# Patient Record
Sex: Male | Born: 1970 | Race: White | Hispanic: No | Marital: Married | Smoking: Current every day smoker
Health system: Southern US, Community
[De-identification: ages and names within clinical notes are randomized; demographics above are authoritative.]

## PROBLEM LIST (undated history)

## (undated) DIAGNOSIS — I10 Essential (primary) hypertension: Secondary | ICD-10-CM

## (undated) DIAGNOSIS — M1710 Unilateral primary osteoarthritis, unspecified knee: Secondary | ICD-10-CM

## (undated) DIAGNOSIS — M25569 Pain in unspecified knee: Secondary | ICD-10-CM

## (undated) DIAGNOSIS — E119 Type 2 diabetes mellitus without complications: Secondary | ICD-10-CM

## (undated) DIAGNOSIS — G8929 Other chronic pain: Secondary | ICD-10-CM

## (undated) DIAGNOSIS — F1110 Opioid abuse, uncomplicated: Secondary | ICD-10-CM

## (undated) HISTORY — PX: KNEE SURGERY: SHX244

## (undated) HISTORY — PX: KNEE ARTHROSCOPY AND ARTHROTOMY: SUR84

---

## 2000-01-06 ENCOUNTER — Encounter: Admission: RE | Admit: 2000-01-06 | Discharge: 2000-04-05 | Payer: Self-pay | Admitting: Anesthesiology

## 2000-04-21 ENCOUNTER — Encounter: Admission: RE | Admit: 2000-04-21 | Discharge: 2000-07-20 | Payer: Self-pay | Admitting: Anesthesiology

## 2000-08-12 ENCOUNTER — Encounter: Admission: RE | Admit: 2000-08-12 | Discharge: 2000-11-10 | Payer: Self-pay | Admitting: Anesthesiology

## 2000-12-01 ENCOUNTER — Encounter: Admission: RE | Admit: 2000-12-01 | Discharge: 2001-03-01 | Payer: Self-pay | Admitting: Anesthesiology

## 2001-03-23 ENCOUNTER — Encounter: Admission: RE | Admit: 2001-03-23 | Discharge: 2001-06-21 | Payer: Self-pay | Admitting: Anesthesiology

## 2002-03-19 ENCOUNTER — Emergency Department (HOSPITAL_COMMUNITY): Admission: EM | Admit: 2002-03-19 | Discharge: 2002-03-19 | Payer: Self-pay | Admitting: Emergency Medicine

## 2002-11-14 ENCOUNTER — Emergency Department (HOSPITAL_COMMUNITY): Admission: EM | Admit: 2002-11-14 | Discharge: 2002-11-14 | Payer: Self-pay | Admitting: Emergency Medicine

## 2002-11-17 ENCOUNTER — Emergency Department (HOSPITAL_COMMUNITY): Admission: EM | Admit: 2002-11-17 | Discharge: 2002-11-17 | Payer: Self-pay | Admitting: Emergency Medicine

## 2002-11-18 ENCOUNTER — Emergency Department (HOSPITAL_COMMUNITY): Admission: EM | Admit: 2002-11-18 | Discharge: 2002-11-18 | Payer: Self-pay | Admitting: Emergency Medicine

## 2002-11-19 ENCOUNTER — Emergency Department (HOSPITAL_COMMUNITY): Admission: EM | Admit: 2002-11-19 | Discharge: 2002-11-19 | Payer: Self-pay | Admitting: Emergency Medicine

## 2002-11-21 ENCOUNTER — Emergency Department (HOSPITAL_COMMUNITY): Admission: EM | Admit: 2002-11-21 | Discharge: 2002-11-21 | Payer: Self-pay | Admitting: Emergency Medicine

## 2002-11-25 ENCOUNTER — Emergency Department (HOSPITAL_COMMUNITY): Admission: EM | Admit: 2002-11-25 | Discharge: 2002-11-25 | Payer: Self-pay | Admitting: *Deleted

## 2002-11-26 ENCOUNTER — Emergency Department (HOSPITAL_COMMUNITY): Admission: EM | Admit: 2002-11-26 | Discharge: 2002-11-26 | Payer: Self-pay | Admitting: *Deleted

## 2002-12-18 ENCOUNTER — Emergency Department (HOSPITAL_COMMUNITY): Admission: EM | Admit: 2002-12-18 | Discharge: 2002-12-18 | Payer: Self-pay | Admitting: Emergency Medicine

## 2002-12-18 ENCOUNTER — Encounter: Payer: Self-pay | Admitting: Emergency Medicine

## 2002-12-19 ENCOUNTER — Emergency Department (HOSPITAL_COMMUNITY): Admission: EM | Admit: 2002-12-19 | Discharge: 2002-12-19 | Payer: Self-pay | Admitting: Emergency Medicine

## 2003-01-16 ENCOUNTER — Emergency Department (HOSPITAL_COMMUNITY): Admission: EM | Admit: 2003-01-16 | Discharge: 2003-01-16 | Payer: Self-pay | Admitting: *Deleted

## 2003-01-21 ENCOUNTER — Emergency Department (HOSPITAL_COMMUNITY): Admission: EM | Admit: 2003-01-21 | Discharge: 2003-01-21 | Payer: Self-pay | Admitting: Emergency Medicine

## 2003-06-08 ENCOUNTER — Encounter: Payer: Self-pay | Admitting: *Deleted

## 2003-06-08 ENCOUNTER — Emergency Department (HOSPITAL_COMMUNITY): Admission: EM | Admit: 2003-06-08 | Discharge: 2003-06-08 | Payer: Self-pay | Admitting: *Deleted

## 2003-10-31 ENCOUNTER — Emergency Department (HOSPITAL_COMMUNITY): Admission: EM | Admit: 2003-10-31 | Discharge: 2003-10-31 | Payer: Self-pay | Admitting: Emergency Medicine

## 2003-12-24 ENCOUNTER — Emergency Department (HOSPITAL_COMMUNITY): Admission: EM | Admit: 2003-12-24 | Discharge: 2003-12-24 | Payer: Self-pay | Admitting: Emergency Medicine

## 2003-12-24 DIAGNOSIS — M25562 Pain in left knee: Secondary | ICD-10-CM | POA: Insufficient documentation

## 2004-01-11 ENCOUNTER — Emergency Department (HOSPITAL_COMMUNITY): Admission: EM | Admit: 2004-01-11 | Discharge: 2004-01-11 | Payer: Self-pay | Admitting: Emergency Medicine

## 2004-01-31 ENCOUNTER — Ambulatory Visit (HOSPITAL_COMMUNITY): Admission: RE | Admit: 2004-01-31 | Discharge: 2004-01-31 | Payer: Self-pay | Admitting: Orthopedic Surgery

## 2004-02-02 ENCOUNTER — Emergency Department (HOSPITAL_COMMUNITY): Admission: EM | Admit: 2004-02-02 | Discharge: 2004-02-03 | Payer: Self-pay | Admitting: *Deleted

## 2004-02-19 ENCOUNTER — Ambulatory Visit (HOSPITAL_COMMUNITY): Admission: RE | Admit: 2004-02-19 | Discharge: 2004-02-19 | Payer: Self-pay | Admitting: Orthopedic Surgery

## 2004-02-24 ENCOUNTER — Encounter (HOSPITAL_COMMUNITY): Admission: RE | Admit: 2004-02-24 | Discharge: 2004-03-25 | Payer: Self-pay | Admitting: Orthopedic Surgery

## 2004-03-27 ENCOUNTER — Encounter (HOSPITAL_COMMUNITY): Admission: RE | Admit: 2004-03-27 | Discharge: 2004-04-26 | Payer: Self-pay | Admitting: Orthopedic Surgery

## 2004-10-01 ENCOUNTER — Emergency Department (HOSPITAL_COMMUNITY): Admission: EM | Admit: 2004-10-01 | Discharge: 2004-10-01 | Payer: Self-pay | Admitting: Emergency Medicine

## 2004-11-17 ENCOUNTER — Emergency Department (HOSPITAL_COMMUNITY): Admission: EM | Admit: 2004-11-17 | Discharge: 2004-11-17 | Payer: Self-pay | Admitting: Emergency Medicine

## 2005-03-14 ENCOUNTER — Emergency Department (HOSPITAL_COMMUNITY): Admission: EM | Admit: 2005-03-14 | Discharge: 2005-03-14 | Payer: Self-pay | Admitting: Emergency Medicine

## 2005-03-14 ENCOUNTER — Emergency Department (HOSPITAL_COMMUNITY): Admission: EM | Admit: 2005-03-14 | Discharge: 2005-03-15 | Payer: Self-pay | Admitting: *Deleted

## 2005-03-21 ENCOUNTER — Emergency Department (HOSPITAL_COMMUNITY): Admission: EM | Admit: 2005-03-21 | Discharge: 2005-03-21 | Payer: Self-pay | Admitting: Emergency Medicine

## 2005-04-13 ENCOUNTER — Emergency Department (HOSPITAL_COMMUNITY): Admission: EM | Admit: 2005-04-13 | Discharge: 2005-04-13 | Payer: Self-pay | Admitting: Emergency Medicine

## 2005-05-01 ENCOUNTER — Emergency Department (HOSPITAL_COMMUNITY): Admission: EM | Admit: 2005-05-01 | Discharge: 2005-05-01 | Payer: Self-pay | Admitting: Emergency Medicine

## 2005-05-02 ENCOUNTER — Emergency Department (HOSPITAL_COMMUNITY): Admission: EM | Admit: 2005-05-02 | Discharge: 2005-05-02 | Payer: Self-pay | Admitting: Emergency Medicine

## 2005-10-13 ENCOUNTER — Emergency Department (HOSPITAL_COMMUNITY): Admission: EM | Admit: 2005-10-13 | Discharge: 2005-10-14 | Payer: Self-pay | Admitting: Emergency Medicine

## 2005-10-26 ENCOUNTER — Emergency Department (HOSPITAL_COMMUNITY): Admission: EM | Admit: 2005-10-26 | Discharge: 2005-10-26 | Payer: Self-pay | Admitting: Emergency Medicine

## 2005-12-21 ENCOUNTER — Emergency Department (HOSPITAL_COMMUNITY): Admission: EM | Admit: 2005-12-21 | Discharge: 2005-12-21 | Payer: Self-pay | Admitting: Emergency Medicine

## 2006-02-24 ENCOUNTER — Emergency Department (HOSPITAL_COMMUNITY): Admission: EM | Admit: 2006-02-24 | Discharge: 2006-02-24 | Payer: Self-pay | Admitting: Emergency Medicine

## 2006-05-15 ENCOUNTER — Emergency Department (HOSPITAL_COMMUNITY): Admission: EM | Admit: 2006-05-15 | Discharge: 2006-05-15 | Payer: Self-pay | Admitting: Emergency Medicine

## 2006-05-25 ENCOUNTER — Emergency Department (HOSPITAL_COMMUNITY): Admission: EM | Admit: 2006-05-25 | Discharge: 2006-05-25 | Payer: Self-pay | Admitting: Emergency Medicine

## 2006-06-13 ENCOUNTER — Emergency Department (HOSPITAL_COMMUNITY): Admission: EM | Admit: 2006-06-13 | Discharge: 2006-06-13 | Payer: Self-pay | Admitting: Emergency Medicine

## 2006-07-06 ENCOUNTER — Emergency Department (HOSPITAL_COMMUNITY): Admission: EM | Admit: 2006-07-06 | Discharge: 2006-07-06 | Payer: Self-pay | Admitting: Emergency Medicine

## 2006-07-09 ENCOUNTER — Emergency Department (HOSPITAL_COMMUNITY): Admission: EM | Admit: 2006-07-09 | Discharge: 2006-07-09 | Payer: Self-pay | Admitting: Emergency Medicine

## 2006-07-12 ENCOUNTER — Emergency Department (HOSPITAL_COMMUNITY): Admission: EM | Admit: 2006-07-12 | Discharge: 2006-07-12 | Payer: Self-pay | Admitting: Emergency Medicine

## 2006-07-24 ENCOUNTER — Emergency Department (HOSPITAL_COMMUNITY): Admission: EM | Admit: 2006-07-24 | Discharge: 2006-07-24 | Payer: Self-pay | Admitting: Emergency Medicine

## 2006-08-22 ENCOUNTER — Emergency Department (HOSPITAL_COMMUNITY): Admission: EM | Admit: 2006-08-22 | Discharge: 2006-08-23 | Payer: Self-pay | Admitting: Emergency Medicine

## 2006-08-29 ENCOUNTER — Emergency Department (HOSPITAL_COMMUNITY): Admission: EM | Admit: 2006-08-29 | Discharge: 2006-08-29 | Payer: Self-pay | Admitting: Emergency Medicine

## 2006-09-17 ENCOUNTER — Emergency Department (HOSPITAL_COMMUNITY): Admission: EM | Admit: 2006-09-17 | Discharge: 2006-09-17 | Payer: Self-pay | Admitting: Emergency Medicine

## 2006-09-30 ENCOUNTER — Emergency Department (HOSPITAL_COMMUNITY): Admission: EM | Admit: 2006-09-30 | Discharge: 2006-09-30 | Payer: Self-pay | Admitting: Emergency Medicine

## 2006-10-15 ENCOUNTER — Emergency Department (HOSPITAL_COMMUNITY): Admission: EM | Admit: 2006-10-15 | Discharge: 2006-10-15 | Payer: Self-pay | Admitting: Emergency Medicine

## 2006-10-22 ENCOUNTER — Emergency Department (HOSPITAL_COMMUNITY): Admission: EM | Admit: 2006-10-22 | Discharge: 2006-10-22 | Payer: Self-pay | Admitting: Emergency Medicine

## 2006-11-05 ENCOUNTER — Emergency Department (HOSPITAL_COMMUNITY): Admission: EM | Admit: 2006-11-05 | Discharge: 2006-11-05 | Payer: Self-pay | Admitting: Emergency Medicine

## 2006-12-04 ENCOUNTER — Emergency Department (HOSPITAL_COMMUNITY): Admission: EM | Admit: 2006-12-04 | Discharge: 2006-12-04 | Payer: Self-pay | Admitting: Emergency Medicine

## 2006-12-17 ENCOUNTER — Emergency Department (HOSPITAL_COMMUNITY): Admission: EM | Admit: 2006-12-17 | Discharge: 2006-12-17 | Payer: Self-pay | Admitting: Emergency Medicine

## 2007-01-01 ENCOUNTER — Emergency Department (HOSPITAL_COMMUNITY): Admission: EM | Admit: 2007-01-01 | Discharge: 2007-01-01 | Payer: Self-pay | Admitting: Emergency Medicine

## 2007-01-02 ENCOUNTER — Emergency Department (HOSPITAL_COMMUNITY): Admission: EM | Admit: 2007-01-02 | Discharge: 2007-01-03 | Payer: Self-pay | Admitting: Emergency Medicine

## 2007-01-15 ENCOUNTER — Emergency Department (HOSPITAL_COMMUNITY): Admission: EM | Admit: 2007-01-15 | Discharge: 2007-01-15 | Payer: Self-pay | Admitting: Emergency Medicine

## 2007-02-06 ENCOUNTER — Emergency Department (HOSPITAL_COMMUNITY): Admission: EM | Admit: 2007-02-06 | Discharge: 2007-02-06 | Payer: Self-pay | Admitting: Emergency Medicine

## 2007-02-22 ENCOUNTER — Emergency Department (HOSPITAL_COMMUNITY): Admission: EM | Admit: 2007-02-22 | Discharge: 2007-02-22 | Payer: Self-pay | Admitting: Emergency Medicine

## 2007-03-13 ENCOUNTER — Emergency Department (HOSPITAL_COMMUNITY): Admission: EM | Admit: 2007-03-13 | Discharge: 2007-03-13 | Payer: Self-pay | Admitting: Emergency Medicine

## 2007-03-19 ENCOUNTER — Emergency Department (HOSPITAL_COMMUNITY): Admission: EM | Admit: 2007-03-19 | Discharge: 2007-03-19 | Payer: Self-pay | Admitting: Emergency Medicine

## 2007-04-03 ENCOUNTER — Emergency Department (HOSPITAL_COMMUNITY): Admission: EM | Admit: 2007-04-03 | Discharge: 2007-04-03 | Payer: Self-pay | Admitting: Emergency Medicine

## 2007-04-09 ENCOUNTER — Emergency Department (HOSPITAL_COMMUNITY): Admission: EM | Admit: 2007-04-09 | Discharge: 2007-04-09 | Payer: Self-pay | Admitting: Emergency Medicine

## 2007-04-20 ENCOUNTER — Emergency Department (HOSPITAL_COMMUNITY): Admission: EM | Admit: 2007-04-20 | Discharge: 2007-04-20 | Payer: Self-pay | Admitting: Emergency Medicine

## 2007-04-26 ENCOUNTER — Emergency Department (HOSPITAL_COMMUNITY): Admission: EM | Admit: 2007-04-26 | Discharge: 2007-04-26 | Payer: Self-pay | Admitting: Emergency Medicine

## 2007-05-01 ENCOUNTER — Emergency Department (HOSPITAL_COMMUNITY): Admission: EM | Admit: 2007-05-01 | Discharge: 2007-05-01 | Payer: Self-pay | Admitting: Diagnostic Radiology

## 2007-05-08 ENCOUNTER — Emergency Department (HOSPITAL_COMMUNITY): Admission: EM | Admit: 2007-05-08 | Discharge: 2007-05-08 | Payer: Self-pay | Admitting: Emergency Medicine

## 2007-06-17 ENCOUNTER — Emergency Department (HOSPITAL_COMMUNITY): Admission: EM | Admit: 2007-06-17 | Discharge: 2007-06-17 | Payer: Self-pay | Admitting: Emergency Medicine

## 2007-06-24 ENCOUNTER — Emergency Department (HOSPITAL_COMMUNITY): Admission: EM | Admit: 2007-06-24 | Discharge: 2007-06-24 | Payer: Self-pay | Admitting: Emergency Medicine

## 2007-07-01 ENCOUNTER — Emergency Department (HOSPITAL_COMMUNITY): Admission: EM | Admit: 2007-07-01 | Discharge: 2007-07-01 | Payer: Self-pay | Admitting: Emergency Medicine

## 2007-07-05 ENCOUNTER — Emergency Department (HOSPITAL_COMMUNITY): Admission: EM | Admit: 2007-07-05 | Discharge: 2007-07-05 | Payer: Self-pay | Admitting: Internal Medicine

## 2007-07-10 ENCOUNTER — Emergency Department (HOSPITAL_COMMUNITY): Admission: EM | Admit: 2007-07-10 | Discharge: 2007-07-10 | Payer: Self-pay | Admitting: Emergency Medicine

## 2007-07-17 ENCOUNTER — Emergency Department (HOSPITAL_COMMUNITY): Admission: EM | Admit: 2007-07-17 | Discharge: 2007-07-17 | Payer: Self-pay | Admitting: Emergency Medicine

## 2007-08-08 ENCOUNTER — Emergency Department (HOSPITAL_COMMUNITY): Admission: EM | Admit: 2007-08-08 | Discharge: 2007-08-08 | Payer: Self-pay | Admitting: Emergency Medicine

## 2007-09-03 ENCOUNTER — Emergency Department (HOSPITAL_COMMUNITY): Admission: EM | Admit: 2007-09-03 | Discharge: 2007-09-03 | Payer: Self-pay | Admitting: Emergency Medicine

## 2007-09-13 ENCOUNTER — Emergency Department (HOSPITAL_COMMUNITY): Admission: EM | Admit: 2007-09-13 | Discharge: 2007-09-13 | Payer: Self-pay | Admitting: *Deleted

## 2007-09-22 ENCOUNTER — Emergency Department (HOSPITAL_COMMUNITY): Admission: EM | Admit: 2007-09-22 | Discharge: 2007-09-22 | Payer: Self-pay | Admitting: Emergency Medicine

## 2007-10-13 ENCOUNTER — Emergency Department (HOSPITAL_COMMUNITY): Admission: EM | Admit: 2007-10-13 | Discharge: 2007-10-13 | Payer: Self-pay | Admitting: Emergency Medicine

## 2007-11-03 ENCOUNTER — Emergency Department (HOSPITAL_COMMUNITY): Admission: EM | Admit: 2007-11-03 | Discharge: 2007-11-03 | Payer: Self-pay | Admitting: Emergency Medicine

## 2007-11-20 ENCOUNTER — Emergency Department (HOSPITAL_COMMUNITY): Admission: EM | Admit: 2007-11-20 | Discharge: 2007-11-20 | Payer: Self-pay | Admitting: Emergency Medicine

## 2007-12-24 ENCOUNTER — Emergency Department (HOSPITAL_COMMUNITY): Admission: EM | Admit: 2007-12-24 | Discharge: 2007-12-24 | Payer: Self-pay | Admitting: Emergency Medicine

## 2008-04-18 ENCOUNTER — Emergency Department (HOSPITAL_COMMUNITY): Admission: EM | Admit: 2008-04-18 | Discharge: 2008-04-18 | Payer: Self-pay | Admitting: Emergency Medicine

## 2008-09-01 IMAGING — CR DG KNEE COMPLETE 4+V*L*
5 series · 5 of 5 positions shown · non-contrast
Comparison: none

CLINICAL DATA: Fall, left knee pain

LEFT KNEE - 4 VIEW

[view not recorded (1 of 5)]
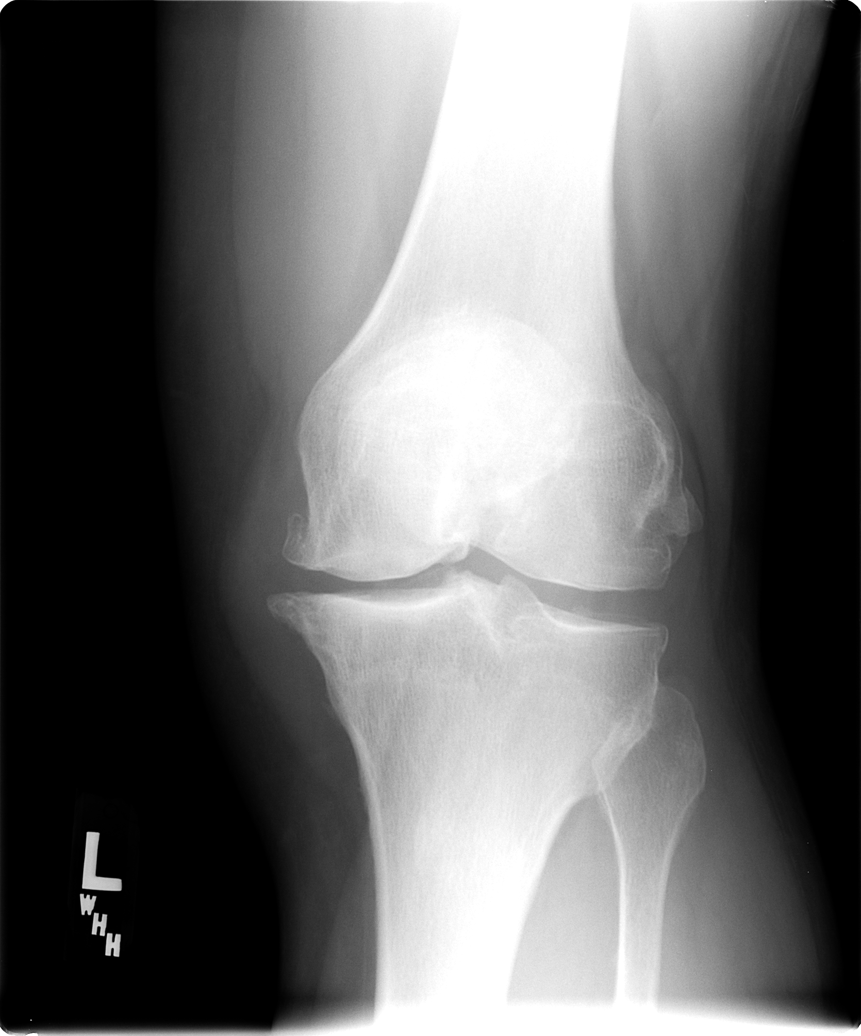

[view not recorded (2 of 5)]
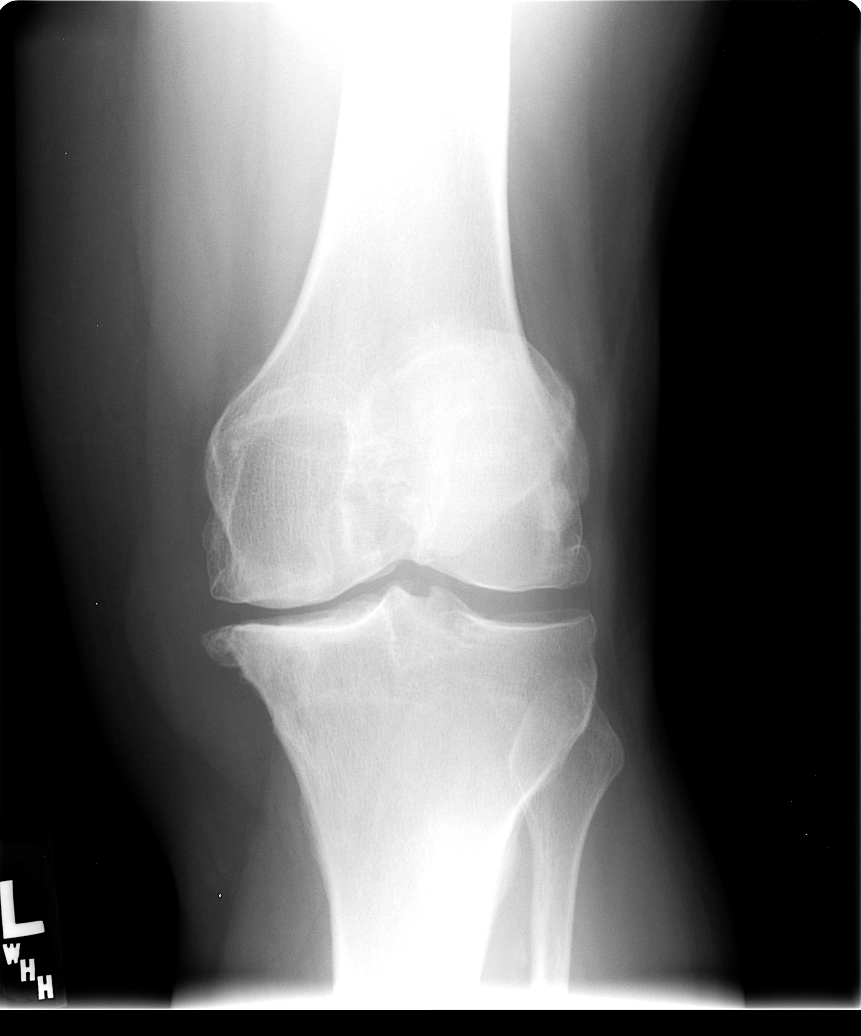

[view not recorded (3 of 5)]
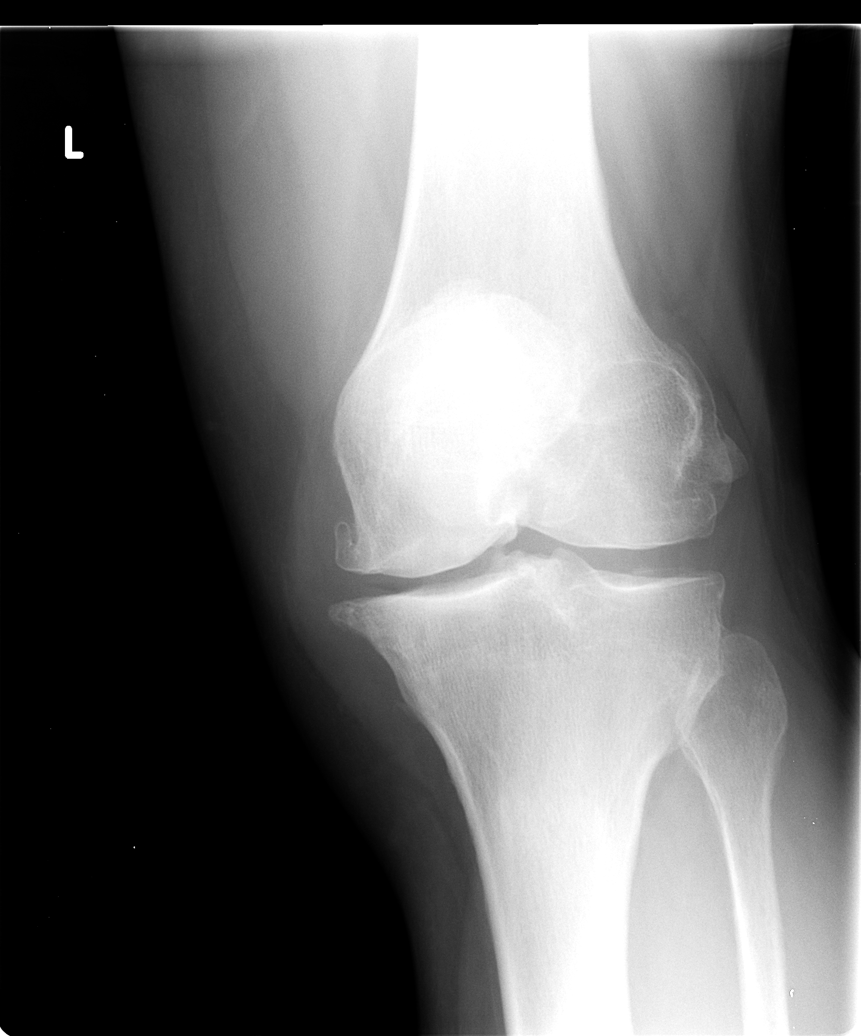

[view not recorded (4 of 5)]
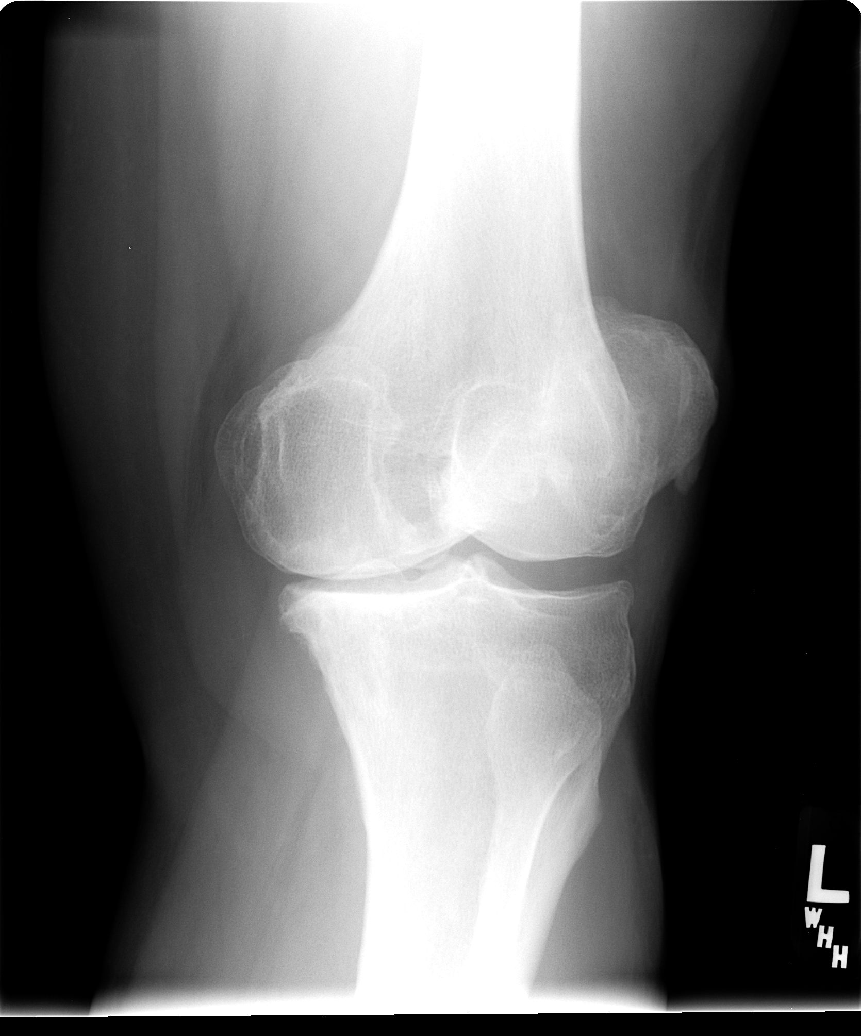

[view not recorded (5 of 5)]
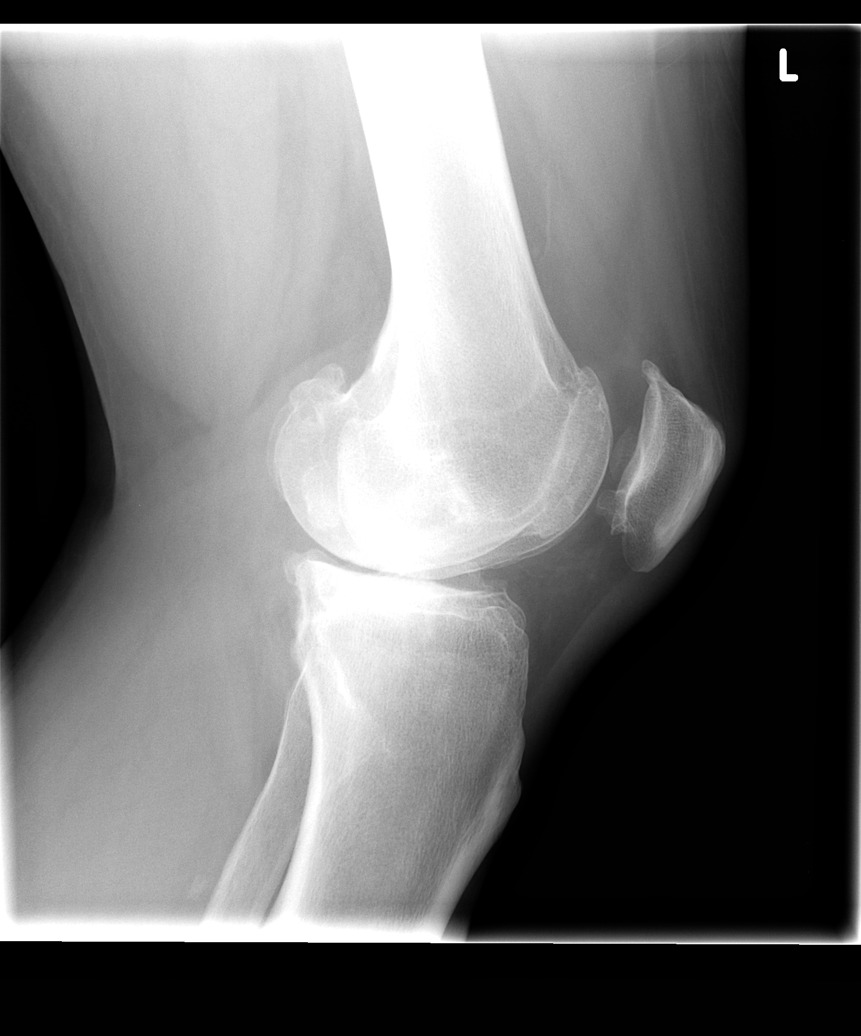

[5 of 5 positions shown; findings below may reference images not displayed]

FINDINGS: Severe degenerative changes throughout the left knee, most pronounced
in the medial compartment. No acute bony abnormality. No fracture, subluxation,
or dislocation.

IMPRESSION

Severe degenerative changes. No acute findings.

## 2009-02-28 ENCOUNTER — Emergency Department (HOSPITAL_COMMUNITY): Admission: EM | Admit: 2009-02-28 | Discharge: 2009-02-28 | Payer: Self-pay | Admitting: Emergency Medicine

## 2009-09-17 ENCOUNTER — Emergency Department (HOSPITAL_COMMUNITY): Admission: EM | Admit: 2009-09-17 | Discharge: 2009-09-17 | Payer: Self-pay | Admitting: Emergency Medicine

## 2010-01-02 ENCOUNTER — Emergency Department (HOSPITAL_COMMUNITY): Admission: EM | Admit: 2010-01-02 | Discharge: 2010-01-02 | Payer: Self-pay | Admitting: Emergency Medicine

## 2010-02-20 ENCOUNTER — Emergency Department (HOSPITAL_COMMUNITY): Admission: EM | Admit: 2010-02-20 | Discharge: 2010-02-20 | Payer: Self-pay | Admitting: Emergency Medicine

## 2010-06-06 ENCOUNTER — Emergency Department (HOSPITAL_COMMUNITY): Admission: EM | Admit: 2010-06-06 | Discharge: 2010-06-06 | Payer: Self-pay | Admitting: Emergency Medicine

## 2010-08-01 ENCOUNTER — Emergency Department (HOSPITAL_COMMUNITY): Admission: EM | Admit: 2010-08-01 | Discharge: 2010-08-01 | Payer: Self-pay | Admitting: Emergency Medicine

## 2010-09-26 ENCOUNTER — Emergency Department (HOSPITAL_COMMUNITY): Admission: EM | Admit: 2010-09-26 | Discharge: 2010-09-26 | Payer: Self-pay | Admitting: Emergency Medicine

## 2010-11-21 ENCOUNTER — Emergency Department (HOSPITAL_COMMUNITY)
Admission: EM | Admit: 2010-11-21 | Discharge: 2010-11-21 | Payer: Self-pay | Source: Home / Self Care | Admitting: Emergency Medicine

## 2010-11-28 ENCOUNTER — Encounter: Payer: Self-pay | Admitting: General Surgery

## 2011-01-15 ENCOUNTER — Emergency Department (HOSPITAL_COMMUNITY)
Admission: EM | Admit: 2011-01-15 | Discharge: 2011-01-15 | Disposition: A | Discharge status: Home / Self Care | Payer: Self-pay | Attending: Emergency Medicine | Admitting: Emergency Medicine

## 2011-01-15 DIAGNOSIS — K089 Disorder of teeth and supporting structures, unspecified: Secondary | ICD-10-CM | POA: Insufficient documentation

## 2011-01-15 DIAGNOSIS — I10 Essential (primary) hypertension: Secondary | ICD-10-CM | POA: Insufficient documentation

## 2011-01-15 DIAGNOSIS — F17200 Nicotine dependence, unspecified, uncomplicated: Secondary | ICD-10-CM | POA: Insufficient documentation

## 2011-03-14 ENCOUNTER — Emergency Department (HOSPITAL_COMMUNITY)
Admission: EM | Admit: 2011-03-14 | Discharge: 2011-03-14 | Disposition: A | Discharge status: Home / Self Care | Payer: Self-pay | Attending: Emergency Medicine | Admitting: Emergency Medicine

## 2011-03-14 DIAGNOSIS — W57XXXA Bitten or stung by nonvenomous insect and other nonvenomous arthropods, initial encounter: Secondary | ICD-10-CM | POA: Insufficient documentation

## 2011-03-14 DIAGNOSIS — M545 Low back pain, unspecified: Secondary | ICD-10-CM | POA: Insufficient documentation

## 2011-03-14 DIAGNOSIS — S30860A Insect bite (nonvenomous) of lower back and pelvis, initial encounter: Secondary | ICD-10-CM | POA: Insufficient documentation

## 2011-03-14 DIAGNOSIS — F17200 Nicotine dependence, unspecified, uncomplicated: Secondary | ICD-10-CM | POA: Insufficient documentation

## 2011-03-14 DIAGNOSIS — I10 Essential (primary) hypertension: Secondary | ICD-10-CM | POA: Insufficient documentation

## 2011-03-26 NOTE — H&P (Signed)
[facility]  Patient:    Steven Barnes, Steven Barnes                         MRN: [id number] Adm. 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 The patient continues to be fairly well-controlled on his current dose of OxyContin at 40 mg twice a day.  He tells me that his employer will only let him come here every eight weeks and he needs to make arrangements to have his prescriptions mailed to him in the interim.  I advised him that it would not be a problem.  He denies any new neurologic symptoms.  He denies any problems with constipation.  He notes that his pain continues to be made worse by reaching, stretching or sitting for long periods.  PHYSICAL EXAMINATION:  VITAL SIGNS:  Blood pressure 134/73.  Heart rate is 86.  Respiratory rate is 18.  O2 saturation is 96%.  Pain level is 6/10.  BACK:  Straight leg raise signs are negative.  EXTREMITIES:  Deep tendon reflexes were symmetric.  IMPRESSION: 1. Low back pain on the basis of lumbar degenerative disk disease. 2. Other medical problems per ______ .  DISPOSITION: 1. Continue on OxyContin 20 mg 2 p.o. q.12h., #120 with no refill. 2. Continue with current bowel regimen. 3. Follow up with me in eight weeks.  He is to let us know when he needs a    prescription mailed to him in the interim. DD:  03/24/01 TD:  03/25/01 Job: [id number]

## 2011-03-26 NOTE — Consult Note (Signed)
[facility]  Patient:    Steven Barnes, Steven Barnes                         MRN: [id number] Proc. Date: 05/19/01 Adm. Date:  [id number] Attending:  Thyra Breed CC:         Artis Delay, M.D., [facility]   Consultation Report  FOLLOW-UP EVALUATION:  Steven Barnes comes in for follow-up evaluation today. He has done fairly well but at times he has had to go up to five tablets per day with OxyContin because of the increased level of activity he had to do at work. He does note that walking and sitting help but laying straight out or stretching up above his arms increases his discomfort. He localizes his pain to the lumbosacral region out to the left.  PHYSICAL EXAMINATION:  Blood pressure 163/91, heart rate 90, respiratory rate 18, O2 saturations 97% and pain level is 6/10. Deep tendon reflexes were symmetric with negative straight leg raise signs. He has increased pain on forward flexion.  IMPRESSION: 1. Low back pain on the basis of lumbar degenerative disk disease. 2. Other medical problems per primary care physician.  DISPOSITION: 1. Continue on current medications with the exception of increasing the    OxyContin to two tablets in the morning, one tablet at mid day and two    tablets in the evening #150 with no refill. 2. Followup with me in eight weeks. 3. Continue general medical care with Dr. Sherwood Gambler at [facility]. DD:  05/19/01 TD:  05/19/01 Job: [id number]

## 2011-03-26 NOTE — H&P (Signed)
[facility]  Patient:    Steven Barnes, Steven Barnes                         MRN: [id number] Adm. Date:  [id number] Attending:  Thyra Breed                         History and Physical  FOLLOW-UP EVALUATION:  Steven Barnes comes in for follow-up evaluation of his chronic low back pain on the basis of lumbar degenerative disk disease.  Since his previous evaluation, the patient has noted that he has pretty good pain control except beginning about eight hours after his morning dose.  We discussed possibly going up on the dose of OxyContin.  In the past, he got sedated from 20 mg three times a day, but he seems to be tolerating it much better than he has in the past.  He does not note that the Baclofen is very helpful.  PHYSICAL EXAMINATION:  VITAL SIGNS:  Blood pressure is 175/68, heart rate 66, respirations 02, O2 saturation 96%, pain level is 9 out of 10, temperature is 97.3.  MUSCULOSKELETAL/NEUROLOGIC:  He exhibited symmetric deep tendon reflexes with tenderness in the paraspinous muscles.  IMPRESSION: 1. Low back pain on the basis of lumbar degenerative disk disease. 2. Other medical problems per Dr. _____.  DISPOSITION: 1. Increase OxyContin to 20 mg one p.o. t.i.d., #90 with no refill. 2. Stop Baclofen. 3. Follow up with me in four to eight weeks.  He was encouraged to call for    a prescription if he chooses to follow up in eight weeks.  He was to let    [place] know if he develops any side effects over and above what he is getting    now from his OxyContin. DD:  06/17/00 TD:  06/18/00 Job: [id number]

## 2011-03-26 NOTE — H&P (Signed)
[facility]  Patient:    Steven Barnes, Steven Barnes                         MRN: [id number] Adm. Date:  [id number] Attending:  Thyra Breed CC:         Dr. Audie Pinto, [place]   History and Physical  FOLLOW UP EVALUATION  HISTORY:  The patient comes in for follow up evaluation of his low back pain on the basis of lumbar degenerative disk disease.  Since his last evaluation, the patient has done well up until increasing his work load to 14 hours per day.  He has noted that the increased work strain is outstripping the medications.  If he is on 8 hour days, he does pretty good and has minimal pain.  He notes that sitting, lying down or remaining relaxed will decrease his discomfort.  Stretching or bending will increase his pain.  CURRENT MEDICATIONS:  OxyContin 20 mg q.a.m., one at mid day and two q.p.m.  PHYSICAL EXAMINATION:  VITAL SIGNS:  Blood pressure 153/80, heart rate 100, respiratory rate 100, O2 saturations 97%, pain level 7/10.  NEUROLOGIC:  Straight leg raise signs are negative.  Deep tendon reflexes are symmetric.  He has paraspinous pain, left greater than right, with increased pain on forward flexion and, to a lesser extent, with hyperextension.  The patient denies nay constipation, sedation or nausea at the current dose.  IMPRESSION: 1. Low back pain on the basis of lumbar degenerative disk disease with    myofascial pain. 2. Other medical problems per Dr. Sherwood Gambler at [facility].  DISPOSITION: 1. Continue on OxyContin 20 mg, one q.a.m., one at mid day and two q.p.m.,    #120 with no refill. 2. Continue with Parafon Forte p.r.n.  He is not actively taking it right now. 3. Followup with me in eight weeks. DD:  10/07/00 TD:  10/07/00 Job: [id number]

## 2011-03-26 NOTE — H&P (Signed)
[facility]  Patient:    Steven Barnes, Steven Barnes                         MRN: [id number] Adm. Date:  [id number] Attending:  Thyra Breed                         History and Physical  Mr. Maxim comes in for followup evaluation of his chronic low back pain on the basis of degenerative disk disease of the lumbar spine.  Since his last evaluation he has done remarkably well on his current regimen of OxyContin 20 mg in the morning, 20 mg midday, and two 20s at night.  He is not having any difficulties with side effects.  He does supplement this occasionally with ibuprofen.  This allows more long-lasting pain improvement.  PHYSICAL EXAMINATION:  VITAL SIGNS:  Patient is afebrile with vital signs stable.  Please see flow sheet for details.  BACK:  Paraspinous tenderness, especially over the lower back.  Left is greater than right.  He has increased pain with forward flexion.  EXTREMITIES:  Deep tendon reflexes were symmetric in the lower extremities and straight leg raise sign is negative.  IMPRESSION: 1. Low back pain on the basis of lumbar degenerative disk disease. 2. Other medical problems per Dr. ______ at [facility].  DISPOSITION: 1. Continue on OxyContin 20 mg one p.o. q.a.m., one p.o. midday, and two p.o.    q.p.m. #120 with no refill. 2. Continue with other medications per Dr. ______. 3. Follow-up with me in eight weeks. DD:  12/02/00 TD:  12/02/00 Job: [id number]

## 2011-03-26 NOTE — Procedures (Signed)
[facility]  Patient:    Steven Barnes, Steven Barnes                         MRN: [id number] Proc. Date: 03/25/00 Adm. Date:  [id number] Attending:  Thyra Breed CC:         Dr. Carlena Sax                           Procedure Report  FOLLOW-UP EVALUATION  Steven Barnes notes that his pain is improved on the OxyContin 20 mg twice a day but he continues to have a lot of discomfort in his mid low back.  He has no pain radiating out to his legs at this time.  He has no weakness.  He continues on light duty because his work will exacerbate his discomfort.  The only medicine he is taking at this time is OxyContin 20 mg b.i.d.  He has been tried on Celebrex in the past but developed some GI problems with it.  PHYSICAL EXAMINATION:  Blood pressure is 151/76, heart rate 109, respiratory rate 14, oxygen saturations 95%.  Temperature is 97 degrees with pain level 8/10.  Straight leg raising signs are negative.  Deep tendon reflexes symmetric at the knees and ankles.  Motor is 5/5.  Hyperextension of his back to approximately 20 degrees increases the discomfort in his lower back, especially to the right.  Forward flexion reduces his discomfort.  IMPRESSION: 1. Low back pain which seems more characteristic of a facet joint arthritis    or facet joint syndrome than degenerative disk disease with previous scans    demonstrating degenerative disk disease. 2. History of dyspepsia on Celebrex. 3. History of asthma per Dr. Carlena Sax. 4. Anxiety disorder per Dr. Carlena Sax. 5. History of migraine headaches per Dr. Carlena Sax.  DISPOSITION: 1. Continue on OxyContin 20 mg 1 p.o. b.i.d. #60 with no refill. 2. Trial of Arthrotec 50 mg 1 p.o. b.i.d. #60 with two refills.  I have    discussed the potential toxicity of this medication in great detail with    the patient. 3. Follow-up with me in four weeks. DISPOSITION: DD:  03/25/00 TD:  03/30/00 Job: [id number]
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
## 2011-03-26 NOTE — H&P (Signed)
[facility]  Patient:    Steven Barnes, Steven Barnes                         MRN: [id number] Adm. Date:  [id number] Attending:  Thyra Breed CC:         Dr. Sherwood Gambler, [facility]  [place]   History and Physical  FOLLOW-UP EVALUATION:  Anjel comes in for follow-up evaluation of his chronic low back pain on the basis of lumbar degenerative disk disease and myofascial pain to the low back.  Since his previous evaluation, the patient has noted that he is doing better and is more functional at work but is waking at night due to his back discomfort.  He asked about the possibility of adding back in a muscle relaxant but apparently did not do well with Baclofen in the past.  PHYSICAL EXAMINATION:  VITAL SIGNS:  Blood pressure 151/74, heart rate 98, respiratory rate 18, O2 saturation 96%, pain level is 8 out of 10.  MUSCULOSKELETAL/NEUROLOGIC:  Deep tendon reflexes were symmetric in the lower extremities with negative straight leg raise signs.  He has tenderness over the paraspinous muscles of the lower back.  IMPRESSION: 1. Low back pain on the basis of lumbar degenerative disk disease with    associated myofascial pain. 2. Other medical problems per Dr. Sherwood Gambler in [place].  DISPOSITION: 1. Increase his OxyContin to 20 mg one in the morning, one at midday, and two    in the evening, #120 with no refill. 2. Parafon Forte one p.o. q.p.m., #30 with two refills. 3. Follow up with me in eight weeks. 4. The patient was encouraged to call if he is not doing well on his current    medical regimen. DD:  08/12/00 TD:  08/13/00 Job: [id number]
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
## 2011-03-26 NOTE — H&P (Signed)
[facility]  Patient:    Steven Barnes, Steven Barnes                         MRN: [id number] Adm. Date:  [id number] Attending:  Thyra Breed CC:         Dr. Carlena Sax                         History and Physical  Kinneth Fujiwara comes in for follow-up evaluation of his chronic low back pain on the basis of lumbar degenerative disk disease.  Since his last evaluation he is complaining more of musculoskeletal pain in the intrascapular region which has been more pronounced since he injured himself at work pulling on a cable. He discussed this with me at his last visit.  His lower back discomfort is a little better than it has been.  Nevertheless, he is rating his pain at 8/10 today.  He is on the OxyContin taking 20 mg b.i.d.  His pain is made worse by sitting, stretching, bending over to pick up objects, and improved by movement.  CURRENT MEDICATIONS:  OxyContin 20 mg one p.o. b.i.d.  PHYSICAL EXAMINATION:  VITAL SIGNS:  Blood pressure 130/78.  Heart rate 90.  Respiratory rate 18.  O2 saturation 98%.  Pain level 8/10.  Temperature 98.1.  EXTREMITIES:  Deep tendon reflexes were symmetric in the lower extremity with negative straight leg raise signs.  BACK:  He has tenderness in the paraspinous muscles of his back in the lumbar region and over the rhomboids.  IMPRESSION: 1. Low back pain on the basis of lumbar degenerative disk disease and history    of recent acute stress as well as myofascial pain up in the intrascapular    region. 2. Other medical problems per Dr. Carlena Sax at [facility].  DISPOSITION: 1. OxyContin 20 mg one p.o. b.i.d. # 60 with no refill. 2. Baclofen 10 mg one-half tablet p.o. t.i.d. # 50 with two refills. 3. Follow up with me in four weeks.  The patient was encouraged to call in a    couple weeks so we can ascertain whether he is having a good response to    the Baclofen and whether this needs to be pushed up. DD:  05/21/00 TD:  05/21/00 Job:  [id number]

## 2011-03-26 NOTE — H&P (Signed)
[facility]  Patient:    Steven Barnes, Steven Barnes                         MRN: [id number] Adm. Date:  [id number] Attending:  Thyra Breed                         History and Physical  HISTORY:  Steven Barnes comes in for follow up evaluation of his chronic low pain on the basis of degenerative disc disease.  The patient stated that he was in his usual state of health up until about a week ago when he was pulling on a cable and he apparently re-injured his back to a degree.  He stated that he was somewhat frustrated by this.  He has pain that is radiating out into his right lower extremity along the posterior aspect of the leg.  It is made worse by weightbearing and walking and improved by rest.  He states that the OxyContin is not holding him through the day as well as he feels it should.  He asked about going on Percocet.  He continues on his Arthrotec.  PHYSICAL EXAMINATION:  VITAL SIGNS:  Blood pressure is 155/82.  Heart rate is 95.  Respiratory rate is 12.  O2 saturation is 99%. Pain level is 8/10.  Temperature is 97.1.  Straight leg raise sign is positive at 70 degrees on the right side.  Deep tendon reflexes were 2+ and symmetric at the knees and ankles.  Motor is 5/5.  His back exam shows increased pain to palpation in the paraspinous muscles of the lower back.  IMPRESSION: 1. Low back pain on the basis of lumbar degenerative disc disease with    superimposed lumbar strain. 2. Other medical problems per Dr. ___ .  DISPOSITION: 1. Discontinue OxyContin. 2. Kadian 30 mg one p.o. b.i.d. #60 with no refills. 3. Prednisone taper beginning at 60 mg a day for two days and taper by    10 mg every two days until he is off #42 with no refill. 4. Follow-up with me in four weeks. 5. Continue on Arthrotec. DD:  04/22/00 TD:  04/22/00 Job: [id number]

## 2011-03-26 NOTE — H&P (Signed)
[facility]  Patient:    Steven Barnes                         MRN: [id number] Adm. Date:  [id number] Attending:  Thyra Breed CC:         Artis Delay, M.D. [facility]   History and Physical  FOLLOW UP EVALUATION:  Steven Barnes comes in for follow up evaluation of his chronic low back pain of degenerative disk disease of the lumbar spine.  Since his previous evaluation he has continued to work pretty heavily and is working up to 12 hours per day.  He knows that this does have a tendency to sort of wear him out towards the end of the day, but he is able to do this and function fairly well on the OxyContin 20 mg one in the morning, one at midday and two in the evenings with minimal side effects.  He has no constipation. He describes no new neurologic problems.  His pain is made worse by reaching, stretching and sitting for long periods of time, but moving around and laying down will decrease his discomfort.  PHYSICAL EXAMINATION:  VITAL SIGNS:  Blood pressure 148/75, heart rate 102, respiratory rate 20, O2 saturations 98%, pain level is 6 out of 10.  Straight leg raise signs were negative.  Deep tendon reflexes symmetric.  He continues to have a hyperkeratotic type eruption over his hands.  IMPRESSION: 1. Low back pain on the basis of lumbar degenerative disk disease. 2. Other medical problems followed by [facility].  DISPOSITION: 1. Continue on OxyContin 20 mg one in the morning, one at midday and two in    the evening #120 with no refills. 2. Continue on other medications per Dr. ________. 3. Follow up with me in eight weeks. DD:  01/27/01 TD:  01/28/01 Job: [id number]

## 2011-04-10 ENCOUNTER — Emergency Department (HOSPITAL_COMMUNITY): Payer: Self-pay

## 2011-04-10 ENCOUNTER — Emergency Department (HOSPITAL_COMMUNITY)
Admission: EM | Admit: 2011-04-10 | Discharge: 2011-04-10 | Disposition: A | Discharge status: Home / Self Care | Payer: Self-pay | Attending: Emergency Medicine | Admitting: Emergency Medicine

## 2011-04-10 DIAGNOSIS — M25469 Effusion, unspecified knee: Secondary | ICD-10-CM | POA: Insufficient documentation

## 2011-04-10 DIAGNOSIS — M25569 Pain in unspecified knee: Secondary | ICD-10-CM | POA: Insufficient documentation

## 2011-04-10 DIAGNOSIS — I10 Essential (primary) hypertension: Secondary | ICD-10-CM | POA: Insufficient documentation

## 2011-07-26 ENCOUNTER — Encounter: Payer: Self-pay | Admitting: *Deleted

## 2011-07-26 ENCOUNTER — Emergency Department (HOSPITAL_COMMUNITY)
Admission: EM | Admit: 2011-07-26 | Discharge: 2011-07-26 | Disposition: A | Discharge status: Home / Self Care | Payer: Self-pay | Attending: Emergency Medicine | Admitting: Emergency Medicine

## 2011-07-26 DIAGNOSIS — L02419 Cutaneous abscess of limb, unspecified: Secondary | ICD-10-CM | POA: Insufficient documentation

## 2011-07-26 DIAGNOSIS — L03119 Cellulitis of unspecified part of limb: Secondary | ICD-10-CM | POA: Insufficient documentation

## 2011-07-26 MED ORDER — HYDROCODONE-ACETAMINOPHEN 5-325 MG PO TABS
1.0000 | ORAL_TABLET | Freq: Once | ORAL | Status: AC
  Administered 2011-07-26: 1 via ORAL
  Filled 2011-07-26: qty 1

## 2011-07-26 MED ORDER — DOXYCYCLINE HYCLATE 100 MG PO TABS
100.0000 mg | ORAL_TABLET | Freq: Once | ORAL | Status: AC
  Administered 2011-07-26: 100 mg via ORAL
  Filled 2011-07-26: qty 1

## 2011-07-26 MED ORDER — DOXYCYCLINE HYCLATE 100 MG PO CAPS: 100.0000 mg | ORAL_CAPSULE | Freq: Two times a day (BID) | ORAL | Status: AC

## 2011-07-26 MED ORDER — HYDROCODONE-ACETAMINOPHEN 5-325 MG PO TABS: 1.0000 | ORAL_TABLET | ORAL | Status: AC | PRN

## 2011-07-26 MED ORDER — IBUPROFEN 800 MG PO TABS
800.0000 mg | ORAL_TABLET | Freq: Once | ORAL | Status: AC
  Administered 2011-07-26: 800 mg via ORAL
  Filled 2011-07-26: qty 1

## 2011-07-26 NOTE — ED Provider Notes (Signed)
History     CSN: [id number] Arrival date & time: 07/26/2011  1:04 AM   Chief Complaint  Patient presents with  . Abscess     (Include location/radiation/quality/duration/timing/severity/associated sxs/prior treatment) HPI Comments: Seen 0133  Patient is a Steven Barnes presenting with abscess. The history is provided by the patient.  Abscess  This is a new problem. The current episode started today. The problem has been gradually worsening. The abscess is present on the right upper leg (inner aspect of right thigh). The problem is moderate. The abscess is characterized by redness, painfulness, burning and swelling. The abscess first occurred at home.     History reviewed. No pertinent past medical history.   Past Surgical History  Procedure Date  . Knee surgery     History reviewed. No pertinent family history.  History  Substance Use Topics  . Smoking status: Current Everyday Smoker -- 1.0 packs/day    Types: Cigarettes  . Smokeless tobacco: Not on file  . Alcohol Use: No      Review of Systems  Skin:       Sore painful area to right inner thigh  All other systems reviewed and are negative.    Allergies  Aspirin  Home Medications   Current Outpatient Rx  Name Route Sig Dispense Refill  . ACETAMINOPHEN 500 MG PO TABS Oral Take 500 mg by mouth every 6 (six) hours as needed.      . IBUPROFEN 200 MG PO TABS Oral Take 400 mg by mouth every 6 (six) hours as needed.        Physical Exam    BP 156/88  Pulse 88  Temp(Src) 98.3 F (36.8 C) (Oral)  Resp 20  Ht 6' (1.829 m)  Wt 285 lb (129.275 kg)  BMI 38.65 kg/m2  SpO2 97%  Physical Exam  Nursing note and vitals reviewed. Constitutional: He is oriented to person, place, and time. He appears well-developed and well-nourished.  HENT:  Head: Normocephalic and atraumatic.  Eyes: EOM are normal.  Neck: Normal range of motion. Neck supple.  Cardiovascular: Normal rate.   Pulmonary/Chest: Breath sounds  normal.  Abdominal: Soft.  Musculoskeletal: Normal range of motion.  Neurological: He is alert and oriented to person, place, and time.  Skin:       2 x 3 cm raised erythematous lesion in upper inner aspect of right thigh. No fluctuance. Early abscess.    ED Course  Procedures  Patient with beginning abscess to right upper inner thigh. Not ready for I&D. Started on antibiotics, given analgesics. Patient informed of clinical course, understand medical decision-making process, and agree with plan. MDM Reviewed: nursing note and vitals         Nicoletta Dress. Colon Branch, MD 07/26/11 4098

## 2011-07-26 NOTE — ED Notes (Signed)
Abscess to right groin since Sunday

## 2011-08-06 ENCOUNTER — Emergency Department (HOSPITAL_COMMUNITY)
Admission: EM | Admit: 2011-08-06 | Discharge: 2011-08-06 | Disposition: A | Discharge status: Home / Self Care | Payer: Self-pay | Attending: Emergency Medicine | Admitting: Emergency Medicine

## 2011-08-06 ENCOUNTER — Encounter (HOSPITAL_COMMUNITY): Payer: Self-pay | Admitting: Emergency Medicine

## 2011-08-06 ENCOUNTER — Emergency Department (HOSPITAL_COMMUNITY): Payer: Self-pay

## 2011-08-06 DIAGNOSIS — F17200 Nicotine dependence, unspecified, uncomplicated: Secondary | ICD-10-CM | POA: Insufficient documentation

## 2011-08-06 DIAGNOSIS — M25569 Pain in unspecified knee: Secondary | ICD-10-CM | POA: Insufficient documentation

## 2011-08-06 HISTORY — DX: Unilateral primary osteoarthritis, unspecified knee: M17.10

## 2011-08-06 MED ORDER — HYDROCODONE-ACETAMINOPHEN 5-500 MG PO TABS: 1.0000 | ORAL_TABLET | Freq: Four times a day (QID) | ORAL | Status: AC | PRN

## 2011-08-06 MED ORDER — IBUPROFEN 800 MG PO TABS
800.0000 mg | ORAL_TABLET | Freq: Once | ORAL | Status: AC
  Administered 2011-08-06: 800 mg via ORAL
  Filled 2011-08-06: qty 1

## 2011-08-06 NOTE — ED Provider Notes (Signed)
History     CSN: [id number] Arrival date & time: 08/06/2011  4:15 AM  Chief Complaint  Patient presents with  . 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  Denies any specific injury or trauma.  States it was swollen earlier.    Patient is a 40 y.o. male presenting with knee pain. The history is provided by the patient.  Knee Pain This is a new problem. The current episode started 3 to 5 hours ago. The problem occurs constantly. The problem has been rapidly worsening. The symptoms are aggravated by twisting and standing. The symptoms are relieved by rest. He has tried nothing for the symptoms.    Past Medical History  Diagnosis Date  . Knee arthropathy     Past Surgical History  Procedure Date  . Knee surgery   . Knee arthroscopy and arthrotomy     No family history on file.  History  Substance Use Topics  . Smoking status: Current Everyday Smoker -- 1.0 packs/day    Types: Cigarettes  . Smokeless tobacco: Not on file  . Alcohol Use: Not on file      Review of Systems  Constitutional: Negative for fever, activity change and appetite change.  Musculoskeletal: Positive for arthralgias. Negative for back pain.  Skin: Negative.     Allergies  Aspirin  Home Medications   Current Outpatient Rx  Name Route Sig Dispense Refill  . ACETAMINOPHEN 500 MG PO TABS Oral Take 500 mg by mouth every 6 (six) hours as needed.      Marland Kitchen DOXYCYCLINE HYCLATE 100 MG PO CAPS Oral Take 1 capsule (100 mg total) by mouth 2 (two) times daily. 20 capsule 0  . HYDROCODONE-ACETAMINOPHEN 5-325 MG PO TABS Oral Take 1 tablet by mouth every 4 (four) hours as needed for pain. 10 tablet 0  . IBUPROFEN 200 MG PO TABS Oral Take 400 mg by mouth every 6 (six) hours as needed.        BP 161/90  Pulse 99  Temp(Src) 97.9 F (36.6 C) (Oral)  Resp 16  Ht 6' (1.829  m)  Wt 280 lb (127.007 kg)  BMI 37.97 kg/m2  SpO2 97%  Physical Exam  Constitutional: He is oriented to person, place, and time. He appears well-developed and well-nourished. No distress.  HENT:  Head: Normocephalic and atraumatic.  Neck: Normal range of motion. Neck supple.  Musculoskeletal:       The left knee appears grossly normal.  There is no effusion or crepitus.  There is no warmth to the touch.  There is pain with palpation anywhere on the knee and pain with range of motion.  It, however does appear to be stable ap and laterally.  Neurological: He is alert and oriented to person, place, and time.  Skin: Skin is warm and dry. He is not diaphoretic. No erythema.    ED Course  Procedures (including critical care time)  Labs Reviewed - No data to display No results found.   No diagnosis found.    MDM  Exam benign.  Xrays show possible small to moderate sized effusion.  Will treat with nsaids, pain meds.        Geoffery Lyons, MD 08/06/11 [phone]

## 2011-08-06 NOTE — ED Notes (Signed)
Roofing yesterday now left knee swollen and painful, cms intact

## 2011-10-01 ENCOUNTER — Emergency Department (HOSPITAL_COMMUNITY): Payer: Self-pay

## 2011-10-01 ENCOUNTER — Encounter (HOSPITAL_COMMUNITY): Payer: Self-pay | Admitting: *Deleted

## 2011-10-01 ENCOUNTER — Emergency Department (HOSPITAL_COMMUNITY)
Admission: EM | Admit: 2011-10-01 | Discharge: 2011-10-01 | Disposition: A | Discharge status: Home / Self Care | Payer: Self-pay | Attending: Emergency Medicine | Admitting: Emergency Medicine

## 2011-10-01 DIAGNOSIS — M25619 Stiffness of unspecified shoulder, not elsewhere classified: Secondary | ICD-10-CM | POA: Insufficient documentation

## 2011-10-01 DIAGNOSIS — S46919A Strain of unspecified muscle, fascia and tendon at shoulder and upper arm level, unspecified arm, initial encounter: Secondary | ICD-10-CM

## 2011-10-01 DIAGNOSIS — M25519 Pain in unspecified shoulder: Secondary | ICD-10-CM | POA: Insufficient documentation

## 2011-10-01 MED ORDER — OXYCODONE-ACETAMINOPHEN 5-325 MG PO TABS: 1.0000 | ORAL_TABLET | ORAL | Status: AC | PRN

## 2011-10-01 MED ORDER — NAPROXEN 500 MG PO TABS: 500.0000 mg | ORAL_TABLET | Freq: Two times a day (BID) | ORAL | Status: DC

## 2011-10-01 NOTE — ED Provider Notes (Signed)
History     CSN: [id number] Arrival date & time: 10/01/2011  5:23 PM   First MD Initiated Contact with Patient 10/01/11 1742      Chief Complaint  Patient presents with  . Shoulder Pain    (Consider location/radiation/quality/duration/timing/severity/associated sxs/prior treatment) HPI Comments: Patient c/o pain to his left shoulder for 3 days.  PAin began after lifting and moving furniture.  Pain is reproduced with palpation and abduction of the arm.  He denies fall, numbness, weakness, swelling or neck pain.    Patient is a 40 y.o. male presenting with shoulder pain. The history is provided by the patient.  Shoulder Pain This is a new problem. The current episode started in the past 7 days. The problem occurs constantly. The problem has been unchanged. Associated symptoms include arthralgias. Pertinent negatives include no chest pain, chills, congestion, coughing, fatigue, fever, headaches, joint swelling, myalgias, neck pain, numbness, rash, sore throat, visual change or weakness. Exacerbated by: movement and palpation. He has tried ice and NSAIDs for the symptoms. The treatment provided no relief.    Past Medical History  Diagnosis Date  . Knee arthropathy     Past Surgical History  Procedure Date  . Knee surgery   . Knee arthroscopy and arthrotomy     History reviewed. No pertinent family history.  History  Substance Use Topics  . Smoking status: Current Everyday Smoker -- 1.0 packs/day    Types: Cigarettes  . Smokeless tobacco: Not on file  . Alcohol Use: No      Review of Systems  Constitutional: Negative for fever, chills and fatigue.  HENT: Negative for congestion, sore throat, trouble swallowing, neck pain and neck stiffness.   Respiratory: Negative for cough, chest tightness, shortness of breath and wheezing.   Cardiovascular: Negative for chest pain and palpitations.  Genitourinary: Negative for dysuria, hematuria and flank pain.  Musculoskeletal:  Positive for arthralgias. Negative for myalgias, back pain and joint swelling.  Skin: Negative.  Negative for rash.  Neurological: Negative for dizziness, weakness, numbness and headaches.  Hematological: Does not bruise/bleed easily.  All other systems reviewed and are negative.    Allergies  Aspirin  Home Medications   Current Outpatient Rx  Name Route Sig Dispense Refill  . ACETAMINOPHEN 500 MG PO TABS Oral Take 500 mg by mouth every 6 (six) hours as needed. For pain    . IBUPROFEN 200 MG PO TABS Oral Take 400 mg by mouth every 6 (six) hours as needed. For pain      BP 169/94  Pulse 104  Temp(Src) 98.2 F (36.8 C) (Oral)  Resp 20  Ht 6' (1.829 m)  Wt 285 lb (129.275 kg)  BMI 38.65 kg/m2  SpO2 97%  Physical Exam  Nursing note and vitals reviewed. Constitutional: He is oriented to person, place, and time. He appears well-developed and well-nourished. No distress.  HENT:  Head: Normocephalic and atraumatic.  Mouth/Throat: Oropharynx is clear and moist.  Neck: Normal range of motion. Neck supple. No spinous process tenderness and no muscular tenderness present. Normal range of motion present. No Brudzinski's sign and no Kernig's sign noted.  Cardiovascular: Normal rate, regular rhythm and normal heart sounds.   Pulmonary/Chest: Effort normal and breath sounds normal. No respiratory distress. He exhibits no tenderness.  Musculoskeletal: He exhibits tenderness. He exhibits no edema.       Left shoulder: He exhibits decreased range of motion, tenderness, bony tenderness and pain. He exhibits no swelling, no effusion, no crepitus, no deformity, no  laceration, no spasm, normal pulse and normal strength.       Arms: Lymphadenopathy:    He has no cervical adenopathy.  Neurological: He is alert and oriented to person, place, and time. He displays normal reflexes. No cranial nerve deficit. He exhibits normal muscle tone. Coordination normal.  Skin: Skin is warm and dry.    ED  Course  Procedures (including critical care time)  Dg Shoulder Left  10/01/2011  *RADIOLOGY REPORT*  Clinical Data: Left shoulder pain.  LEFT SHOULDER - 2+ VIEW  Comparison: None.  Findings: Left shoulder is located.  There is no fracture.  The [facility] joint appears within normal limits.  IMPRESSION: Negative.  Original Report Authenticated By: Andreas Newport, M.D.        MDM      Sling applied to the left shoulder for comfort.  Likely strain.  Will prescribe pain meds, NSAIDs,  RICE therapy.  Referral for ortho if no improvement  ttp of the left AC joint.  No edema.  Pain reproduced with palpation and abduction of the arm.  ROM is somewhat limited due to level of pain. Also ttp left trapiezus muscle.  Radial pulse is brisk.  CR< 2 sec.  Sensation intact.  No erythema or edema        Tamie Minteer L. Trisha Mangle, [place] 10/01/11 1840

## 2011-10-01 NOTE — ED Notes (Signed)
Patient with no complaints at this time. Respirations even and unlabored. Skin warm/dry. Discharge instructions reviewed with patient at this time. Patient given opportunity to voice concerns/ask questions. Patient discharged at this time and left Emergency Department with steady gait.   

## 2011-10-01 NOTE — ED Notes (Signed)
Pt c/o severe left shoulder pain; pt states he has been moving furniture and is now not able to move his left arm

## 2011-10-02 NOTE — ED Provider Notes (Signed)
Medical screening examination/treatment/procedure(s) were performed by non-physician practitioner and as supervising physician I was immediately available for consultation/collaboration.   Fate Caster M Dalonda Simoni, DO 10/02/11 2211 

## 2011-10-04 ENCOUNTER — Encounter (HOSPITAL_COMMUNITY): Payer: Self-pay | Admitting: Emergency Medicine

## 2011-10-04 ENCOUNTER — Emergency Department (HOSPITAL_COMMUNITY)
Admission: EM | Admit: 2011-10-04 | Discharge: 2011-10-04 | Disposition: A | Discharge status: Home / Self Care | Payer: Self-pay | Attending: Emergency Medicine | Admitting: Emergency Medicine

## 2011-10-04 DIAGNOSIS — M75102 Unspecified rotator cuff tear or rupture of left shoulder, not specified as traumatic: Secondary | ICD-10-CM

## 2011-10-04 DIAGNOSIS — M25519 Pain in unspecified shoulder: Secondary | ICD-10-CM | POA: Insufficient documentation

## 2011-10-04 MED ORDER — OXYCODONE-ACETAMINOPHEN 10-325 MG PO TABS: 1.0000 | ORAL_TABLET | Freq: Four times a day (QID) | ORAL | Status: AC | PRN

## 2011-10-04 NOTE — ED Notes (Signed)
Patient states he injured his left shoulder on 09/30/2011; states was moving some items.  Patient states he was seen in ER and had xrays and to f/u with orthopedics.  Patient doesn't have follow-up appointment scheduled due to holiday.  Patient states he is out of pain medications

## 2011-10-04 NOTE — ED Provider Notes (Signed)
History     CSN: [id number] Arrival date & time: 10/04/2011 12:28 AM   First MD Initiated Contact with Patient 10/04/11 0106      Chief Complaint  Patient presents with  . Shoulder Pain    (Consider location/radiation/quality/duration/timing/severity/associated sxs/prior treatment) HPI The patient injured his shoulder lifting and was seen in the emergency department on 09/30/2011. He had an unremarkable x-ray at that time and was treated with naproxen and Percocet. He has not been able to followup with orthopedics and returns requesting a refill of pain medication. He states the pain is severe and of a burning nature. It is located on the superior aspect of the shoulder. He has limited range of motion of the left shoulder particularly on abduction. He has attempted passive range of motion but with limited success. He denies other acute complaints. He denies constipation caused by the Percocet. He has an appointment with Dr. Romeo Apple in 5 days.  Past Medical History  Diagnosis Date  . Knee arthropathy     Past Surgical History  Procedure Date  . Knee surgery   . Knee arthroscopy and arthrotomy     No family history on file.  History  Substance Use Topics  . Smoking status: Current Everyday Smoker -- 1.0 packs/day    Types: Cigarettes  . Smokeless tobacco: Not on file  . Alcohol Use: No      Review of Systems  All other systems reviewed and are negative.    Allergies  Aspirin  Home Medications   Current Outpatient Rx  Name Route Sig Dispense Refill  . ACETAMINOPHEN 500 MG PO TABS Oral Take 500 mg by mouth every 6 (six) hours as needed. For pain    . IBUPROFEN 200 MG PO TABS Oral Take 400 mg by mouth every 6 (six) hours as needed. For pain    . NAPROXEN 500 MG PO TABS Oral Take 1 tablet (500 mg total) by mouth 2 (two) times daily. Prn  Pain.  Take with food 21 tablet 0  . OXYCODONE-ACETAMINOPHEN 5-325 MG PO TABS Oral Take 1 tablet by mouth every 4 (four) hours as  needed for pain. 16 tablet 0    BP 152/97  Pulse 107  Temp(Src) 98.4 F (36.9 C) (Oral)  Resp 20  Ht 6' (1.829 m)  Wt 285 lb (129.275 kg)  BMI 38.65 kg/m2  SpO2 97%  Physical Exam General: Well-developed, well-nourished male in no acute distress; appearance consistent with age of record; appears uncomfortable HENT: normocephalic, atraumatic; nasal voice Eyes: Normal Neck: supple Heart: regular rate and rhythm Lungs: Normal respiratory effort and excursion Abdomen: soft; nondistended Extremities: No deformity; pulses normal; left rotator cuff tenderness with decreased range of motion, left upper extremity distally neurovascularly intact Neurologic: Awake, alert and oriented; motor function intact in all extremities and symmetric; no facial droop Skin: Warm and dry     ED Course  Procedures (including critical care time)     MDM          Hanley Seamen, MD 10/04/11 0124

## 2011-11-02 ENCOUNTER — Emergency Department (HOSPITAL_COMMUNITY): Payer: Self-pay

## 2011-11-02 ENCOUNTER — Emergency Department (HOSPITAL_COMMUNITY)
Admission: EM | Admit: 2011-11-02 | Discharge: 2011-11-02 | Disposition: A | Discharge status: Home / Self Care | Payer: Self-pay | Attending: Emergency Medicine | Admitting: Emergency Medicine

## 2011-11-02 ENCOUNTER — Encounter (HOSPITAL_COMMUNITY): Payer: Self-pay | Admitting: *Deleted

## 2011-11-02 DIAGNOSIS — IMO0002 Reserved for concepts with insufficient information to code with codable children: Secondary | ICD-10-CM | POA: Insufficient documentation

## 2011-11-02 DIAGNOSIS — R29898 Other symptoms and signs involving the musculoskeletal system: Secondary | ICD-10-CM | POA: Insufficient documentation

## 2011-11-02 DIAGNOSIS — M1710 Unilateral primary osteoarthritis, unspecified knee: Secondary | ICD-10-CM | POA: Insufficient documentation

## 2011-11-02 DIAGNOSIS — F17200 Nicotine dependence, unspecified, uncomplicated: Secondary | ICD-10-CM | POA: Insufficient documentation

## 2011-11-02 DIAGNOSIS — M1712 Unilateral primary osteoarthritis, left knee: Secondary | ICD-10-CM

## 2011-11-02 DIAGNOSIS — M25569 Pain in unspecified knee: Secondary | ICD-10-CM | POA: Insufficient documentation

## 2011-11-02 MED ORDER — OXYCODONE-ACETAMINOPHEN 5-325 MG PO TABS
2.0000 | ORAL_TABLET | Freq: Once | ORAL | Status: AC
  Administered 2011-11-02: 2 via ORAL
  Filled 2011-11-02: qty 2

## 2011-11-02 MED ORDER — OXYCODONE-ACETAMINOPHEN 5-325 MG PO TABS: ORAL_TABLET | ORAL | Status: DC

## 2011-11-02 NOTE — ED Notes (Signed)
Pt presents with left knee pain, stating has had surgery in past, however knee started hurting yesterday and has progressively gotten worse over past 24 hrs. Pt denies injury/trauma to knee. Reports Dr Romeo Apple is his orthopedic MD.

## 2011-11-02 NOTE — ED Notes (Signed)
Pt states left knee pain began during the night. Described as throbbing pain. No known injury.
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
## 2011-11-03 NOTE — ED Provider Notes (Signed)
Medical screening examination/treatment/procedure(s) were performed by non-physician practitioner and as supervising physician I was immediately available for consultation/collaboration.   Shelda Jakes, MD 11/03/11 [phone]

## 2011-11-12 ENCOUNTER — Encounter (HOSPITAL_COMMUNITY): Payer: Self-pay | Admitting: *Deleted

## 2011-11-12 ENCOUNTER — Emergency Department (HOSPITAL_COMMUNITY)
Admission: EM | Admit: 2011-11-12 | Discharge: 2011-11-12 | Disposition: A | Discharge status: Home / Self Care | Payer: Self-pay | Attending: Emergency Medicine | Admitting: Emergency Medicine

## 2011-11-12 DIAGNOSIS — M1990 Unspecified osteoarthritis, unspecified site: Secondary | ICD-10-CM | POA: Insufficient documentation

## 2011-11-12 DIAGNOSIS — M25569 Pain in unspecified knee: Secondary | ICD-10-CM | POA: Insufficient documentation

## 2011-11-12 DIAGNOSIS — Z79899 Other long term (current) drug therapy: Secondary | ICD-10-CM | POA: Insufficient documentation

## 2011-11-12 DIAGNOSIS — Z9889 Other specified postprocedural states: Secondary | ICD-10-CM | POA: Insufficient documentation

## 2011-11-12 DIAGNOSIS — F17200 Nicotine dependence, unspecified, uncomplicated: Secondary | ICD-10-CM | POA: Insufficient documentation

## 2011-11-12 MED ORDER — OXYCODONE-ACETAMINOPHEN 5-325 MG PO TABS
2.0000 | ORAL_TABLET | Freq: Once | ORAL | Status: AC
  Administered 2011-11-12: 2 via ORAL
  Filled 2011-11-12: qty 2

## 2011-11-12 MED ORDER — ETODOLAC 500 MG PO TABS: 500.0000 mg | ORAL_TABLET | Freq: Two times a day (BID) | ORAL | Status: DC

## 2011-11-12 NOTE — ED Provider Notes (Signed)
History     CSN: [id number]  Arrival date & time 11/12/11  1754   First MD Initiated Contact with Patient 11/12/11 1812      Chief Complaint  Patient presents with  . Knee Pain    (Consider location/radiation/quality/duration/timing/severity/associated sxs/prior treatment) HPI Patient presents to the emergency room complaining of recurrent left knee pain. Patient has a history of recurrent problems with his left knee. He has had a knee arthroscopy in the past and was told according to the patient that he would need a knee replacement at some point. Patient states pain is primarily on the inside aspect of his knee. It increases with movement and palpation. He states he was working and adduct all day today and the pain became more severe. He denies any recent injuries or falls. There's been no fever or swelling. Patient was seen in the emergency room the end of last month and had x-rays that showed degenerative changes. Patient has plans to followup with Dr. Romeo Apple. He states he's been taking Percocet for the pain. Past Medical History  Diagnosis Date  . Knee arthropathy     Past Surgical History  Procedure Date  . Knee surgery   . Knee arthroscopy and arthrotomy     No family history on file.  History  Substance Use Topics  . Smoking status: Current Everyday Smoker -- 1.0 packs/day    Types: Cigarettes  . Smokeless tobacco: Not on file  . Alcohol Use: No      Review of Systems  Constitutional: Negative for fever.  Musculoskeletal: Negative for back pain and joint swelling.    Allergies  Aspirin  Home Medications   Current Outpatient Rx  Name Route Sig Dispense Refill  . ACETAMINOPHEN 500 MG PO TABS Oral Take 500 mg by mouth every 6 (six) hours as needed. For pain    . IBUPROFEN 200 MG PO TABS Oral Take 400 mg by mouth every 6 (six) hours as needed. For pain    . NAPROXEN 500 MG PO TABS Oral Take 1 tablet (500 mg total) by mouth 2 (two) times daily. Prn  Pain.   Take with food 21 tablet 0  . OXYCODONE-ACETAMINOPHEN 5-325 MG PO TABS  Take one or 2 tablets every 6 hours prn pain 30 tablet 0    BP 156/92  Pulse 114  Temp(Src) 98.3 F (36.8 C) (Oral)  Resp 20  SpO2 98%  Physical Exam  Nursing note and vitals reviewed. Constitutional: He appears well-developed and well-nourished. No distress.  HENT:  Head: Normocephalic and atraumatic.  Right Ear: External ear normal.  Left Ear: External ear normal.  Eyes: Conjunctivae are normal. Right eye exhibits no discharge. Left eye exhibits no discharge. No scleral icterus.  Neck: Neck supple. No tracheal deviation present.  Cardiovascular: Normal rate.   Pulmonary/Chest: Effort normal. No stridor. No respiratory distress.  Musculoskeletal: He exhibits no edema.       Left knee: He exhibits bony tenderness. He exhibits no swelling, no effusion, no erythema, normal alignment, no LCL laxity and no MCL laxity. tenderness found. Medial joint line tenderness noted.  Neurological: He is alert. Cranial nerve deficit: no gross deficits.  Skin: Skin is warm and dry. No rash noted.  Psychiatric: He has a normal mood and affect.    ED Course  Procedures (including critical care time)  Labs Reviewed - No data to display No results found.   Dx:  Knee pain, degenerative joint disease   MDM  Old records have  been reviewed. Patient has had several visits for knee pain in the past. At this time there does not appear to be any evidence of an acute infection. There is no effusion on my exam. Patient has been provided pain medications here in the emergency room. I encouraged him to continue with nonsteroidal anti-inflammatory medications. He has also been given crutches and an Ace wrap. He is to followup with his orthopedic doctor as planned.        Celene Kras, MD 11/12/11 [phone]

## 2011-11-12 NOTE — ED Notes (Signed)
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 States throbbing pain to knee. States she has an appt with Dr. Romeo Apple on the 17th for the same.

## 2012-01-28 ENCOUNTER — Encounter (HOSPITAL_COMMUNITY): Payer: Self-pay | Admitting: *Deleted

## 2012-01-28 ENCOUNTER — Emergency Department (HOSPITAL_COMMUNITY): Payer: Self-pay

## 2012-01-28 ENCOUNTER — Emergency Department (HOSPITAL_COMMUNITY)
Admission: EM | Admit: 2012-01-28 | Discharge: 2012-01-28 | Disposition: A | Discharge status: Home / Self Care | Payer: Self-pay | Attending: Emergency Medicine | Admitting: Emergency Medicine

## 2012-01-28 DIAGNOSIS — M25512 Pain in left shoulder: Secondary | ICD-10-CM

## 2012-01-28 DIAGNOSIS — M25519 Pain in unspecified shoulder: Secondary | ICD-10-CM | POA: Insufficient documentation

## 2012-01-28 DIAGNOSIS — F17200 Nicotine dependence, unspecified, uncomplicated: Secondary | ICD-10-CM | POA: Insufficient documentation

## 2012-01-28 MED ORDER — IBUPROFEN 800 MG PO TABS: 800.0000 mg | ORAL_TABLET | Freq: Three times a day (TID) | ORAL | Status: AC

## 2012-01-28 MED ORDER — OXYCODONE-ACETAMINOPHEN 5-325 MG PO TABS: 1.0000 | ORAL_TABLET | ORAL | Status: AC | PRN

## 2012-01-28 MED ORDER — OXYCODONE-ACETAMINOPHEN 5-325 MG PO TABS
2.0000 | ORAL_TABLET | Freq: Once | ORAL | Status: AC
  Administered 2012-01-28: 2 via ORAL
  Filled 2012-01-28: qty 2

## 2012-01-28 NOTE — ED Notes (Signed)
Has been doing woodworking,Today has pain lt shoulder

## 2012-01-28 NOTE — Discharge Instructions (Signed)
Shoulder Pain The shoulder is a ball and socket joint. The muscles and tendons (rotator cuff) are what keep the shoulder in its joint and stable. This collection of muscles and tendons holds in the head (ball) of the humerus (upper arm bone) in the fossa (cup) of the scapula (shoulder blade). Today no reason was found for your shoulder pain. Often pain in the shoulder may be treated conservatively with temporary immobilization. For example, holding the shoulder in one place using a sling for rest. Physical therapy may be needed if problems continue. HOME CARE INSTRUCTIONS   Apply ice to the sore area for 15 to 20 minutes, 3 to 4 times per day for the first 2 days. Put the ice in a plastic bag. Place a towel between the bag of ice and your skin.   If you have or were given a shoulder sling and straps, do not remove for as long as directed by your caregiver or until you see a caregiver for a follow-up examination. If you need to remove it to shower or bathe, move your arm as little as possible.   Sleep on several pillows at night to lessen swelling and pain.   Only take over-the-counter or prescription medicines for pain, discomfort, or fever as directed by your caregiver.   Keep any follow-up appointments in order to avoid any type of permanent shoulder disability or chronic pain problems.  SEEK MEDICAL CARE IF:   Pain in your shoulder increases or new pain develops in your arm, hand, or fingers.   Your hand or fingers are colder than your other hand.   You do not obtain pain relief with the medications or your pain becomes worse.  SEEK IMMEDIATE MEDICAL CARE IF:   Your arm, hand, or fingers are numb or tingling.   Your arm, hand, or fingers are swollen, painful, or turn white or blue.   You develop chest pain or shortness of breath.  MAKE SURE YOU:   Understand these instructions.   Will watch your condition.   Will get help right away if you are not doing well or get worse.    Document Released: 08/04/2005 Document Revised: 10/14/2011 Document Reviewed: 10/09/2011 [employer] Patient Information 2012 [employer]. 

## 2012-01-28 NOTE — ED Notes (Signed)
Pt presents with left shoulder pain. States feels like a shooting pain in the ball and socket. Pt is noted to have limited ROM in affected shoulder. Pt has been "framing work a lot lately". Denies known injury.

## 2012-01-28 NOTE — ED Provider Notes (Signed)
History     CSN: [id number]  Arrival date & time 01/28/12  1314   First MD Initiated Contact with Patient 01/28/12 1341      Chief Complaint  Patient presents with  . Shoulder Pain    (Consider location/radiation/quality/duration/timing/severity/associated sxs/prior treatment) HPI Comments: Patient comes to ED with complaint of left shoulder pain for several days. He states pain began after he was cutting wood. He states his shoulder hurts worse with movement. Pain improves slightly with rest. He denies any chest pain shortness of breath headaches or neck pain. He reports a history of a rotator cuff injury to the same shoulder several years ago. He also denies numbness of his left arm or weakness.  Patient is a 41 y.o. male presenting with shoulder pain. The history is provided by the patient. No language interpreter was used.  Shoulder Pain This is a new problem. The current episode started in the past 7 days. The problem occurs constantly. The problem has been unchanged. Associated symptoms include arthralgias. Pertinent negatives include no chest pain, congestion, diaphoresis, headaches, joint swelling, neck pain, numbness, swollen glands or weakness. Exacerbated by: Movement and palpation. He has tried nothing for the symptoms. The treatment provided no relief.    Past Medical History  Diagnosis Date  . Knee arthropathy     Past Surgical History  Procedure Date  . Knee surgery   . Knee arthroscopy and arthrotomy     History reviewed. No pertinent family history.  History  Substance Use Topics  . Smoking status: Current Everyday Smoker -- 1.0 packs/day    Types: Cigarettes  . Smokeless tobacco: Not on file  . Alcohol Use: No      Review of Systems  Constitutional: Negative for diaphoresis.  HENT: Negative for congestion and neck pain.   Respiratory: Negative for chest tightness.   Cardiovascular: Negative for chest pain.  Musculoskeletal: Positive for arthralgias.  Negative for back pain, joint swelling and gait problem.  Skin: Negative.   Neurological: Negative for weakness, numbness and headaches.  All other systems reviewed and are negative.    Allergies  Hornet venom and Aspirin  Home Medications   Current Outpatient Rx  Name Route Sig Dispense Refill  . ACETAMINOPHEN 500 MG PO TABS Oral Take 500-1,000 mg by mouth every 6 (six) hours as needed. For pain    . IBUPROFEN 200 MG PO TABS Oral Take 400 mg by mouth every 6 (six) hours as needed. For pain      BP 169/98  Pulse 115  Temp(Src) 97.6 F (36.4 C) (Oral)  Resp 22  Ht 6' (1.829 m)  Wt 285 lb (129.275 kg)  BMI 38.65 kg/m2  SpO2 97%  Physical Exam  Nursing note and vitals reviewed. Constitutional: He is oriented to person, place, and time. He appears well-developed and well-nourished. No distress.  HENT:  Head: Normocephalic and atraumatic.  Neck: Normal range of motion. Neck supple.  Cardiovascular: Normal rate, regular rhythm and normal heart sounds.   Pulmonary/Chest: Effort normal and breath sounds normal. No respiratory distress.  Musculoskeletal: He exhibits tenderness. He exhibits no edema.       Left shoulder: He exhibits decreased range of motion, tenderness, bony tenderness and pain. He exhibits no swelling, no effusion, no crepitus, no deformity, no laceration, normal pulse and normal strength.       Arms:      Patient has localized tenderness to palpation of the anterior left shoulder. Pain is worse with abduction or rotation of  the shoulder. No obvious deformities, erythema, or excessive warmth. Radial pulse is brisk, sensation is intact, capillary refill is less than 2 seconds.  Lymphadenopathy:    He has no cervical adenopathy.  Neurological: He is alert and oriented to person, place, and time. He exhibits normal muscle tone. Coordination normal.  Skin: Skin is warm and dry.    ED Course  Procedures (including critical care time)  Labs Reviewed - No data to  display Dg Shoulder Left  01/28/2012  *RADIOLOGY REPORT*  Clinical Data: 41 year old male with left shoulder pain.  LEFT SHOULDER - 2+ VIEW  Comparison: 10/01/2011  Findings: There is no evidence of acute bony abnormality. There is no evidence of acute fracture, subluxation, or dislocation. No focal bony lesions are identified. The visualized left hemithorax is unremarkable.  IMPRESSION: No evidence of acute abnormality.  Original Report Authenticated By: Rosendo Gros, M.D.      Patient has a sling at home   MDM      I have reviewed the radiologist's interpretation of today's x-ray.  Patient has history of previous rotator cuff injury of the same shoulder. Pain is reproduced with abduction of the left arm. Grip strengths are strong and equal bilaterally, radial pulse is brisk, sensation intact, capillary refill is less than 2 seconds. I will prescribe a short course of pain medication and anti-inflammatories advised patient to wear the sling only for comfort. He agrees to close followup with Dr. Romeo Apple if the symptoms are not improving.  Patient / Family / Caregiver understand and agree with initial ED impression and plan with expectations set for ED visit. Pt stable in ED with no significant deterioration in condition. Pt feels improved after observation and/or treatment in ED.      Arihana Ambrocio L. Donalds, [place] 01/29/12 1751

## 2012-01-29 NOTE — ED Provider Notes (Signed)
Medical screening examination/treatment/procedure(s) were performed by non-physician practitioner and as supervising physician I was immediately available for consultation/collaboration.  Nicoletta Dress. Colon Branch, MD 01/29/12 [id number]

## 2012-03-26 ENCOUNTER — Encounter (HOSPITAL_COMMUNITY): Payer: Self-pay | Admitting: *Deleted

## 2012-03-26 ENCOUNTER — Emergency Department (HOSPITAL_COMMUNITY)
Admission: EM | Admit: 2012-03-26 | Discharge: 2012-03-26 | Disposition: A | Discharge status: Home / Self Care | Payer: Self-pay | Attending: Emergency Medicine | Admitting: Emergency Medicine

## 2012-03-26 DIAGNOSIS — F17200 Nicotine dependence, unspecified, uncomplicated: Secondary | ICD-10-CM | POA: Insufficient documentation

## 2012-03-26 DIAGNOSIS — Z79899 Other long term (current) drug therapy: Secondary | ICD-10-CM | POA: Insufficient documentation

## 2012-03-26 DIAGNOSIS — G8929 Other chronic pain: Secondary | ICD-10-CM | POA: Insufficient documentation

## 2012-03-26 DIAGNOSIS — M25569 Pain in unspecified knee: Secondary | ICD-10-CM | POA: Insufficient documentation

## 2012-03-26 DIAGNOSIS — M25562 Pain in left knee: Secondary | ICD-10-CM

## 2012-03-26 HISTORY — DX: Other chronic pain: G89.29

## 2012-03-26 HISTORY — DX: Pain in unspecified knee: M25.569

## 2012-03-26 MED ORDER — NAPROXEN 250 MG PO TABS: 250.0000 mg | ORAL_TABLET | Freq: Two times a day (BID) | ORAL | Status: DC

## 2012-03-26 MED ORDER — OXYCODONE-ACETAMINOPHEN 5-325 MG PO TABS
2.0000 | ORAL_TABLET | Freq: Once | ORAL | Status: AC
  Administered 2012-03-26: 2 via ORAL
  Filled 2012-03-26: qty 2

## 2012-03-26 MED ORDER — HYDROCODONE-ACETAMINOPHEN 5-325 MG PO TABS: ORAL_TABLET | ORAL | Status: AC

## 2012-03-26 NOTE — ED Provider Notes (Signed)
History     CSN: [id number]  Arrival date & time 03/26/12  1743   First MD Initiated Contact with Patient 03/26/12 1803      Chief Complaint  Patient presents with  . Knee Pain    HPI Pt was seen at 1805.  Per pt, c/o gradual onset and persistence of constant acute flair of his chronic left knee "pain" since yesterday.  States his pain began after he was "working in the garden."  Denies any change in his usual chronic pain pattern.  Denies focal motor weakness, no tingling/numbness in extremities, no fevers, no direct injury, no rash.   The symptoms have been associated with no other complaints. The patient has a significant history of similar symptoms previously, recently being evaluated for this complaint and multiple prior evals for same.    Past Medical History  Diagnosis Date  . Knee arthropathy   . Chronic knee pain     Past Surgical History  Procedure Date  . Knee surgery   . Knee arthroscopy and arthrotomy      History  Substance Use Topics  . Smoking status: Current Everyday Smoker -- 1.0 packs/day    Types: Cigarettes  . Smokeless tobacco: Not on file  . Alcohol Use: No    Review of Systems ROS: Statement: All systems negative except as marked or noted in the HPI; Constitutional: Negative for fever and chills. ; ; Eyes: Negative for eye pain, redness and discharge. ; ; ENMT: Negative for ear pain, hoarseness, nasal congestion, sinus pressure and sore throat. ; ; Cardiovascular: Negative for chest pain, palpitations, diaphoresis, dyspnea and peripheral edema. ; ; Respiratory: Negative for cough, wheezing and stridor. ; ; Gastrointestinal: Negative for nausea, vomiting, diarrhea, abdominal pain, blood in stool, hematemesis, jaundice and rectal bleeding. . ; ; Genitourinary: Negative for dysuria, flank pain and hematuria. ; ; Musculoskeletal: +knee pain. Negative for back pain and neck pain. Negative for swelling and trauma.; ; Skin: Negative for pruritus, rash,  abrasions, blisters, bruising and skin lesion.; ; Neuro: Negative for headache, lightheadedness and neck stiffness. Negative for weakness, altered level of consciousness , altered mental status, extremity weakness, paresthesias, involuntary movement, seizure and syncope.     Allergies  Hornet venom and Aspirin  Home Medications   Current Outpatient Rx  Name Route Sig Dispense Refill  . ACETAMINOPHEN 500 MG PO TABS Oral Take 500-1,000 mg by mouth every 6 (six) hours as needed. For pain    . IBUPROFEN 200 MG PO TABS Oral Take 400 mg by mouth every 6 (six) hours as needed. For pain      BP 151/80  Pulse 97  Temp 98.3 F (36.8 C)  Resp 18  Ht 6' (1.829 m)  Wt 285 lb (129.275 kg)  BMI 38.65 kg/m2  SpO2 98%  Physical Exam 1810: Physical examination:  Nursing notes reviewed; Vital signs and O2 SAT reviewed;  Constitutional: Well developed, Well nourished, Well hydrated, In no acute distress; Head:  Normocephalic, atraumatic; Eyes: EOMI, PERRL, No scleral icterus; ENMT: Mouth and pharynx normal, Mucous membranes moist; Neck: Supple, Full range of motion, No lymphadenopathy; Cardiovascular: Regular rate and rhythm, No murmur, rub, or gallop; Respiratory: Breath sounds clear & equal bilaterally, No rales, rhonchi, wheezes, or rub, Normal respiratory effort/excursion; Chest: Nontender, Movement normal;; Extremities: Pulses normal, No tenderness to left ankle or foot.  +FROM left knee, including able to lift extended LLE against gravity, and extend left lower leg against resistance.  No ligamentous laxity.  No  patellar or quad tendon step-offs.  NMS intact left foot, strong pedal pp.  No proximal fibular head tenderness.  No edema, erythema, ecchymosis, or warmth.  +left knee with generalized TTP, no specific area of point tenderness. , No edema, No calf edema or asymmetry.; Neuro: AA&Ox3, Major CN grossly intact.  No gross focal motor or sensory deficits in extremities.; Skin: Color normal, Warm,  Dry.   ED Course  Procedures   MDM  MDM Reviewed: nursing note, vitals and previous chart Reviewed previous: x-ray      6:11 PM:  Pt with multiple previous ED evals for same pain.  Hx of chronic pain in left knee, no new injury.  Pt has not f/u with Ortho MD.  Pt encouraged to f/u with Ortho MD for good continuity of care and control of his chronic pain.  Verb understanding.        Laray Anger, DO 03/27/12 1752

## 2012-03-26 NOTE — Discharge Instructions (Signed)
RESOURCE GUIDE  Dental Problems  Patients with Medicaid: [facility]                     [facility] [address]                                           [address] Phone:  [phone]                                                  Phone:  [phone]  If unable to pay or uninsured, contact:  [facility] or [facility] to become qualified for the adult dental clinic.  Chronic Pain Problems Contact [facility]  [phone] Patients need to be referred by their primary care doctor.  Insufficient Money for Medicine Contact [employer]:  call "211" or [facility] 271-5999.  No Primary Care Doctor Call [facility]  832-8000 Other agencies that provide inexpensive medical care    [facility]  [phone]    [facility]  [phone]    [facility]  [phone]    Women's Clinic  [phone]    [facility]  [phone]    [facility]  [phone]  Psychological Services [facility]  [phone] [facility]  [phone] [facility]   [phone] (emergency services 641-4993)  Substance Abuse Resources [facility]  336-882-2125 [facility] 336-784-9470 [facility] 336-285-9073 [facility] 336-845-3988 Residential & Outpatient Substance Abuse Program  800-659-3381  Abuse/Neglect [place] Child Abuse Hotline (336) 641-3795 [place] Child Abuse Hotline 800-378-5315 (After Hours)  Emergency Shelter [facility] (336) 271-5985  Maternity Homes [facility] (336) 275-9566 [facility] (704) 372-4663  MRSA Hotline #:   832-7006    [place] Resources  [facility] [place]     [employer]                          [facility] [address] [place]                       [address]      [address]  [place]                                                [place]                            [place] Phone:  [phone]                                   Phone:  [phone]                 Phone:  [phone]  [facility] Phone:  [phone]    [facility] Child Abuse Hotline 8650819093 720 057 8483 (After Hours)   Take the prescriptions as directed.  Apply moist heat or ice to the area(s) of discomfort, for 15 minutes at a time, several times per day for the next few days.  Do not fall asleep on a heating or ice pack.  Call your regular medical doctor, the Orthopedist, and the Pain Management specialist on Monday to schedule a follow up appointment this week.  Return to the Emergency Department immediately if worsening.

## 2012-03-26 NOTE — ED Notes (Signed)
Pt states that he was working out in his garden yesterday, began to experience pain in left knee afterwards, pt states that he has a "knot" to the inside of left knee area, unsure of any injury

## 2012-03-26 NOTE — ED Notes (Signed)
Pt presents with left knee pain after working in the garden with the roto-tiller. Pt states knee was "sore last night" but awoke this morning with increased pain and swelling. Ice placed per pt. Reports decreased swelling after ice placed. Pt states cannot bear weight without increased pain.

## 2012-05-05 ENCOUNTER — Encounter (HOSPITAL_COMMUNITY): Payer: Self-pay | Admitting: Emergency Medicine

## 2012-05-05 ENCOUNTER — Emergency Department (HOSPITAL_COMMUNITY)
Admission: EM | Admit: 2012-05-05 | Discharge: 2012-05-05 | Disposition: A | Discharge status: Home / Self Care | Payer: Self-pay | Attending: Emergency Medicine | Admitting: Emergency Medicine

## 2012-05-05 DIAGNOSIS — M25519 Pain in unspecified shoulder: Secondary | ICD-10-CM | POA: Insufficient documentation

## 2012-05-05 DIAGNOSIS — X500XXA Overexertion from strenuous movement or load, initial encounter: Secondary | ICD-10-CM | POA: Insufficient documentation

## 2012-05-05 DIAGNOSIS — Z886 Allergy status to analgesic agent status: Secondary | ICD-10-CM | POA: Insufficient documentation

## 2012-05-05 DIAGNOSIS — Z91038 Other insect allergy status: Secondary | ICD-10-CM | POA: Insufficient documentation

## 2012-05-05 MED ORDER — OXYCODONE-ACETAMINOPHEN 5-325 MG PO TABS
1.0000 | ORAL_TABLET | Freq: Once | ORAL | Status: AC
  Administered 2012-05-05: 1 via ORAL
  Filled 2012-05-05: qty 1

## 2012-05-05 NOTE — ED Notes (Signed)
Patient states he was putting up chain link fence yesterday and c/o left shoulder pain today.  States has been up most of the night with pain.

## 2012-05-05 NOTE — ED Provider Notes (Signed)
History     CSN: [id number]  Arrival date & time 05/05/12  0549   First MD Initiated Contact with Patient 05/05/12 [phone]      Chief Complaint  Patient presents with  . Shoulder Pain     Patient is a 41 y.o. male presenting with shoulder pain. The history is provided by the patient.  Shoulder Pain This is a recurrent problem. The current episode started yesterday. The problem occurs constantly. The problem has been gradually worsening. Pertinent negatives include no chest pain. Exacerbated by: palpation. The symptoms are relieved by rest. He has tried rest for the symptoms. The treatment provided no relief.  pt reports he aggravated his shoulder while putting up a chain link fence.  No falls/injury noted.  No weakness in his arm.  No cp/sob  Past Medical History  Diagnosis Date  . Knee arthropathy   . Chronic knee pain     Past Surgical History  Procedure Date  . Knee surgery   . Knee arthroscopy and arthrotomy     No family history on file.  History  Substance Use Topics  . Smoking status: Current Everyday Smoker -- 1.0 packs/day    Types: Cigarettes  . Smokeless tobacco: Not on file  . Alcohol Use: No      Review of Systems  Constitutional: Negative for fever.  Cardiovascular: Negative for chest pain.    Allergies  Hornet venom and Aspirin  Home Medications   Current Outpatient Rx  Name Route Sig Dispense Refill  . ACETAMINOPHEN 500 MG PO TABS Oral Take 500-1,000 mg by mouth every 6 (six) hours as needed. For pain    . IBUPROFEN 200 MG PO TABS Oral Take 400 mg by mouth every 6 (six) hours as needed. For pain    . NAPROXEN 250 MG PO TABS Oral Take 1 tablet (250 mg total) by mouth 2 (two) times daily with a meal. 14 tablet 0    BP 155/77  Pulse 94  Temp 97.4 F (36.3 C) (Oral)  Resp 18  Ht 6' (1.829 m)  Wt 285 lb (129.275 kg)  BMI 38.65 kg/m2  SpO2 96%  Physical Exam CONSTITUTIONAL: Well developed/well nourished HEAD AND FACE:  Normocephalic/atraumatic EYES: EOMI/PERRL ENMT: Mucous membranes moist NECK: supple no meningeal signs SPINE:entire spine nontender CV: S1/S2 noted, no murmurs/rubs/gallops noted LUNGS: Lungs are clear to auscultation bilaterally, no apparent distress ABDOMEN: soft, nontender, no rebound or guarding GU:no cva tenderness NEURO: Pt is awake/alert, moves all extremitiesx4 EXTREMITIES: pulses normal, full ROM.  Tenderness with ROM of left shoulder, but no deformity and he can fully abduct the shoulder SKIN: warm, color normal PSYCH: no abnormalities of mood noted  ED Course  Procedures    1. Shoulder pain    No imaging indicated as he had no direct trauma   MDM  Nursing notes including past medical history and social history reviewed and considered in documentation Previous records reviewed and considered - visits for          Joya Gaskins, MD 05/05/12 [phone]

## 2012-05-20 ENCOUNTER — Encounter (HOSPITAL_COMMUNITY): Payer: Self-pay

## 2012-05-20 ENCOUNTER — Emergency Department (HOSPITAL_COMMUNITY)
Admission: EM | Admit: 2012-05-20 | Discharge: 2012-05-20 | Disposition: A | Discharge status: Home / Self Care | Payer: Self-pay | Attending: Emergency Medicine | Admitting: Emergency Medicine

## 2012-05-20 ENCOUNTER — Emergency Department (HOSPITAL_COMMUNITY): Payer: Self-pay

## 2012-05-20 DIAGNOSIS — S8390XA Sprain of unspecified site of unspecified knee, initial encounter: Secondary | ICD-10-CM

## 2012-05-20 DIAGNOSIS — G8929 Other chronic pain: Secondary | ICD-10-CM | POA: Insufficient documentation

## 2012-05-20 DIAGNOSIS — F17200 Nicotine dependence, unspecified, uncomplicated: Secondary | ICD-10-CM | POA: Insufficient documentation

## 2012-05-20 DIAGNOSIS — M25569 Pain in unspecified knee: Secondary | ICD-10-CM | POA: Insufficient documentation

## 2012-05-20 MED ORDER — OXYCODONE-ACETAMINOPHEN 5-325 MG PO TABS: 1.0000 | ORAL_TABLET | ORAL | Status: AC | PRN

## 2012-05-20 MED ORDER — OXYCODONE-ACETAMINOPHEN 5-325 MG PO TABS
1.0000 | ORAL_TABLET | Freq: Once | ORAL | Status: AC
  Administered 2012-05-20: 1 via ORAL
  Filled 2012-05-20: qty 1

## 2012-05-20 NOTE — ED Provider Notes (Signed)
History     CSN: [id number]  Arrival date & time 05/20/12  1548   First MD Initiated Contact with Patient 05/20/12 1601      Chief Complaint  Patient presents with  . Knee Pain    (Consider location/radiation/quality/duration/timing/severity/associated sxs/prior treatment) HPI Comments: Patient complains of pain to his left knee that occurred when he accidentally stepped into a hole while mowing his lawn. He states that his knee twisted and he has pain with weightbearing. He denies numbness or weakness of his left leg. He denies other injuries.  Patient is a 41 y.o. male presenting with knee pain. The history is provided by the patient.  Knee Pain This is a new problem. The current episode started today. The problem occurs constantly. The problem has been unchanged. Associated symptoms include arthralgias. Pertinent negatives include no fever, joint swelling, myalgias, nausea, neck pain, numbness, rash or weakness. The symptoms are aggravated by standing, twisting and walking. He has tried nothing for the symptoms. The treatment provided no relief.    Past Medical History  Diagnosis Date  . Knee arthropathy   . Chronic knee pain     Past Surgical History  Procedure Date  . Knee surgery   . Knee arthroscopy and arthrotomy     No family history on file.  History  Substance Use Topics  . Smoking status: Current Everyday Smoker -- 1.0 packs/day    Types: Cigarettes  . Smokeless tobacco: Not on file  . Alcohol Use: No      Review of Systems  Constitutional: Negative for fever.  HENT: Negative for neck pain.   Gastrointestinal: Negative for nausea.  Musculoskeletal: Positive for arthralgias. Negative for myalgias, back pain and joint swelling.  Skin: Negative for color change, rash and wound.  Neurological: Negative for dizziness, weakness and numbness.  All other systems reviewed and are negative.    Allergies  Hornet venom and Aspirin  Home Medications    Current Outpatient Rx  Name Route Sig Dispense Refill  . ACETAMINOPHEN 500 MG PO TABS Oral Take 500-1,000 mg by mouth every 6 (six) hours as needed. For pain    . IBUPROFEN 200 MG PO TABS Oral Take 400 mg by mouth every 6 (six) hours as needed. For pain    . NAPROXEN 250 MG PO TABS Oral Take 1 tablet (250 mg total) by mouth 2 (two) times daily with a meal. 14 tablet 0    BP 159/97  Pulse 108  Temp 98.2 F (36.8 C) (Oral)  Resp 20  SpO2 98%  Physical Exam  Nursing note and vitals reviewed. Constitutional: He is oriented to person, place, and time. He appears well-developed and well-nourished. No distress.  Cardiovascular: Normal rate, regular rhythm, normal heart sounds and intact distal pulses.   Pulmonary/Chest: Effort normal and breath sounds normal.  Musculoskeletal: He exhibits tenderness. He exhibits no edema.       Left knee: He exhibits decreased range of motion. He exhibits no swelling, no effusion, no ecchymosis, no deformity, no laceration and no erythema. tenderness found. Medial joint line tenderness noted.       Legs:      ttp of the medial left knee.  No erythema,edema,  bruising or deformity.    Neurological: He is alert and oriented to person, place, and time. He exhibits normal muscle tone. Coordination normal.  Skin: Skin is warm and dry. No erythema.    ED Course  Procedures (including critical care time)  Labs Reviewed - No  data to display   Dg Knee Complete 4 Views Left  05/20/2012  *RADIOLOGY REPORT*  Clinical Data: Knee pain at inferior patella after fall.  LEFT KNEE - COMPLETE 4+ VIEW  Comparison: 11/02/2011  Findings: Severe medial and patellofemoral compartment joint space narrowing and osteophyte formation.  Mild lateral compartment osteoarthritis. No acute fracture or dislocation.  No definite joint effusion.  IMPRESSION: Markedly age advanced osteoarthritis. No acute superimposed process.  Original Report Authenticated By: Consuello Bossier, M.D.       MDM     Patient has a knee immobilizer at home.     patient has tenderness to palpation of the medial aspect of the left knee. Pain is reproduced with flexion. He is able to bear some weight to the knee. Distal sensation is intact. No tenderness to the left lower leg or ankle. No effusion of the joint or erythema. Pain is likely related to a sprain, patient has a history of chronic knee pain. He agrees to close followup with Dr. Hilda Lias if needed.  The patient appears reasonably screened and/or stabilized for discharge and I doubt any other medical condition or other [facility] requiring further screening, evaluation, or treatment in the ED at this time prior to discharge.   Prescribed: Percocet #[id number]    Hibo Blasdell L. [place], [place] 05/20/12 1903

## 2012-05-20 NOTE — ED Notes (Signed)
Pt was mowing today and fell into hole, now having left knee pain.

## 2012-05-20 NOTE — ED Notes (Signed)
Pt presents with left knee and foot pain after twisting foot in "hole while mowing".  No swelling noted. States can bear weight at this time. pedal Pulses equal bilateral.

## 2012-05-21 NOTE — ED Provider Notes (Signed)
Medical screening examination/treatment/procedure(s) were performed by non-physician practitioner and as supervising physician I was immediately available for consultation/collaboration. Devoria Albe, MD, FACEP   Ward Givens, MD 05/21/12 [phone]

## 2012-07-04 ENCOUNTER — Encounter (HOSPITAL_COMMUNITY): Payer: Self-pay | Admitting: *Deleted

## 2012-07-04 ENCOUNTER — Emergency Department (HOSPITAL_COMMUNITY)
Admission: EM | Admit: 2012-07-04 | Discharge: 2012-07-04 | Disposition: A | Discharge status: Home / Self Care | Payer: Self-pay | Attending: Emergency Medicine | Admitting: Emergency Medicine

## 2012-07-04 DIAGNOSIS — F17200 Nicotine dependence, unspecified, uncomplicated: Secondary | ICD-10-CM | POA: Insufficient documentation

## 2012-07-04 DIAGNOSIS — G8929 Other chronic pain: Secondary | ICD-10-CM | POA: Insufficient documentation

## 2012-07-04 DIAGNOSIS — M25569 Pain in unspecified knee: Secondary | ICD-10-CM | POA: Insufficient documentation

## 2012-07-04 DIAGNOSIS — Z91038 Other insect allergy status: Secondary | ICD-10-CM | POA: Insufficient documentation

## 2012-07-04 DIAGNOSIS — Z888 Allergy status to other drugs, medicaments and biological substances status: Secondary | ICD-10-CM | POA: Insufficient documentation

## 2012-07-04 MED ORDER — DEXAMETHASONE 4 MG PO TABS: ORAL_TABLET | ORAL | Status: AC

## 2012-07-04 MED ORDER — ONDANSETRON 4 MG PO TBDP
4.0000 mg | ORAL_TABLET | Freq: Once | ORAL | Status: AC
  Administered 2012-07-04: 4 mg via ORAL
  Filled 2012-07-04: qty 1

## 2012-07-04 MED ORDER — DICLOFENAC SODIUM 75 MG PO TBEC: 75.0000 mg | DELAYED_RELEASE_TABLET | Freq: Two times a day (BID) | ORAL | Status: DC

## 2012-07-04 MED ORDER — KETOROLAC TROMETHAMINE 60 MG/2ML IM SOLN
60.0000 mg | Freq: Once | INTRAMUSCULAR | Status: AC
  Administered 2012-07-04: 60 mg via INTRAMUSCULAR
  Filled 2012-07-04: qty 2

## 2012-07-04 MED ORDER — DEXAMETHASONE SODIUM PHOSPHATE 4 MG/ML IJ SOLN
8.0000 mg | Freq: Once | INTRAMUSCULAR | Status: AC
  Administered 2012-07-04: 8 mg via INTRAMUSCULAR
  Filled 2012-07-04: qty 2

## 2012-07-04 NOTE — ED Provider Notes (Signed)
Medical screening examination/treatment/procedure(s) were performed by non-physician practitioner and as supervising physician I was immediately available for consultation/collaboration.   Lyanne Co, MD 07/04/12 [phone]

## 2012-07-04 NOTE — ED Provider Notes (Signed)
History     CSN: [id number]  Arrival date & time 07/04/12  0009   First MD Initiated Contact with Patient 07/04/12 0013      Chief Complaint  Patient presents with  . Knee Pain    (Consider location/radiation/quality/duration/timing/severity/associated sxs/prior treatment) Patient is a 41 y.o. male presenting with knee pain. The history is provided by the patient.  Knee Pain This is a chronic problem. The current episode started more than 1 month ago. The problem occurs daily. The problem has been gradually worsening. Associated symptoms include arthralgias. Pertinent negatives include no abdominal pain, chest pain, coughing, fever or neck pain. The symptoms are aggravated by standing and walking. He has tried NSAIDs for the symptoms. The treatment provided no relief.    Past Medical History  Diagnosis Date  . Knee arthropathy   . Chronic knee pain     Past Surgical History  Procedure Date  . Knee surgery   . Knee arthroscopy and arthrotomy     History reviewed. No pertinent family history.  History  Substance Use Topics  . Smoking status: Current Everyday Smoker -- 1.0 packs/day    Types: Cigarettes  . Smokeless tobacco: Not on file  . Alcohol Use: No      Review of Systems  Constitutional: Negative for fever and activity change.       All ROS Neg except as noted in HPI  HENT: Negative for nosebleeds and neck pain.   Eyes: Negative for photophobia and discharge.  Respiratory: Negative for cough, shortness of breath and wheezing.   Cardiovascular: Negative for chest pain and palpitations.  Gastrointestinal: Negative for abdominal pain and blood in stool.  Genitourinary: Negative for dysuria, frequency and hematuria.  Musculoskeletal: Positive for arthralgias. Negative for back pain.  Skin: Negative.   Neurological: Negative for dizziness, seizures and speech difficulty.  Psychiatric/Behavioral: Negative for hallucinations and confusion.    Allergies  Hornet  venom and Aspirin  Home Medications   Current Outpatient Rx  Name Route Sig Dispense Refill  . ACETAMINOPHEN 500 MG PO TABS Oral Take 500-1,000 mg by mouth every 6 (six) hours as needed. For pain    . IBUPROFEN 200 MG PO TABS Oral Take 400 mg by mouth every 6 (six) hours as needed. For pain    . NAPROXEN 250 MG PO TABS Oral Take 1 tablet (250 mg total) by mouth 2 (two) times daily with a meal. 14 tablet 0    BP 164/87  Pulse 100  Temp 99 F (37.2 C)  Resp 16  Ht 6' (1.829 m)  Wt 285 lb (129.275 kg)  BMI 38.65 kg/m2  SpO2 96%  Physical Exam  Nursing note and vitals reviewed. Constitutional: He is oriented to person, place, and time. He appears well-developed and well-nourished.  Non-toxic appearance.  HENT:  Head: Normocephalic.  Right Ear: Tympanic membrane and external ear normal.  Left Ear: Tympanic membrane and external ear normal.  Eyes: EOM and lids are normal. Pupils are equal, round, and reactive to light.  Neck: Normal range of motion. Neck supple. Carotid bruit is not present.  Cardiovascular: Regular rhythm, normal heart sounds, intact distal pulses and normal pulses.  Tachycardia present.   Pulmonary/Chest: Breath sounds normal. No respiratory distress.  Abdominal: Soft. Bowel sounds are normal. There is no tenderness. There is no guarding.  Musculoskeletal: Normal range of motion.       There is full range of motion of the left hip. There is degenerative joint disease deformity of  the left knee. There is crepitus with movement of the knee. There is no effusion noted of the knee. There is no posterior mass appreciated of the left knee. There is no deformity of the left tib-fib area. There is good range of motion of the left ankle. There no temperature changes appreciated.  Lymphadenopathy:       Head (right side): No submandibular adenopathy present.       Head (left side): No submandibular adenopathy present.    He has no cervical adenopathy.  Neurological: He is  alert and oriented to person, place, and time. He has normal strength. No cranial nerve deficit or sensory deficit.  Skin: Skin is warm and dry.  Psychiatric: He has a normal mood and affect. His speech is normal.    ED Course  Procedures (including critical care time)  Labs Reviewed - No data to display No results found.   No diagnosis found.    MDM  I have reviewed nursing notes, vital signs, and all appropriate lab and imaging results for this patient. Patient has a history of chronic knee pain, he has been told that he needs a knee replacement on the left. The patient presents tonight after having pain that was slow to allow him to rest. No new or acute findings on examination. The patient is given prescription for dexamethasone one daily with food and diclofenac 2 tablets daily with food. Patient is to see the orthopedic surgeon for additional evaluation and management of his knee problem.       Kathie Dike, [place] 07/04/12 [phone]

## 2012-07-04 NOTE — ED Notes (Signed)
Pt reporting pain and swelling in left knee.  Reports no improvement after using ice and ibuprofen.  Reports noticed pain tonight after work.

## 2012-08-06 ENCOUNTER — Encounter (HOSPITAL_COMMUNITY): Payer: Self-pay | Admitting: Emergency Medicine

## 2012-08-06 ENCOUNTER — Emergency Department (HOSPITAL_COMMUNITY): Payer: Self-pay

## 2012-08-06 DIAGNOSIS — M25519 Pain in unspecified shoulder: Secondary | ICD-10-CM | POA: Insufficient documentation

## 2012-08-06 DIAGNOSIS — G8929 Other chronic pain: Secondary | ICD-10-CM | POA: Insufficient documentation

## 2012-08-06 DIAGNOSIS — F17200 Nicotine dependence, unspecified, uncomplicated: Secondary | ICD-10-CM | POA: Insufficient documentation

## 2012-08-06 NOTE — ED Notes (Signed)
Patient complaining of left shoulder pain. Reports started after moving furniture today. States intermittent sharp pains running down neck and into shoulder. Hurts worse with movement.

## 2012-08-07 ENCOUNTER — Emergency Department (HOSPITAL_COMMUNITY)
Admission: EM | Admit: 2012-08-07 | Discharge: 2012-08-07 | Disposition: A | Discharge status: Home / Self Care | Payer: Self-pay | Attending: Emergency Medicine | Admitting: Emergency Medicine

## 2012-08-07 DIAGNOSIS — M25512 Pain in left shoulder: Secondary | ICD-10-CM

## 2012-08-07 MED ORDER — NAPROXEN 500 MG PO TABS: 500.0000 mg | ORAL_TABLET | Freq: Two times a day (BID) | ORAL | Status: DC

## 2012-08-07 MED ORDER — IBUPROFEN 400 MG PO TABS
600.0000 mg | ORAL_TABLET | Freq: Once | ORAL | Status: AC
  Administered 2012-08-07: 600 mg via ORAL
  Filled 2012-08-07: qty 2

## 2012-08-07 MED ORDER — OXYCODONE-ACETAMINOPHEN 5-325 MG PO TABS
1.0000 | ORAL_TABLET | Freq: Once | ORAL | Status: AC
  Administered 2012-08-07: 1 via ORAL
  Filled 2012-08-07: qty 1

## 2012-08-07 NOTE — ED Provider Notes (Signed)
History     CSN: [id number]  Arrival date & time 08/06/12  2300   First MD Initiated Contact with Patient 08/07/12 0026      Chief Complaint  Patient presents with  . Shoulder Pain     The history is provided by the patient and medical records.  the patient reports recurrent left shoulder pain.  He reports he exacerbated his left shoulder today while assisting his mother with work around the house.  He reports pain in his anterior left shoulder.  He reports pain with range of motion.  His pain is mild to moderate in severity at this time.  He has tried over-the-counter medications without improvement in his symptoms.  His no fever or chills.  He denies symptoms.   Past Medical History  Diagnosis Date  . Knee arthropathy   . Chronic knee pain     Past Surgical History  Procedure Date  . Knee surgery   . Knee arthroscopy and arthrotomy     History reviewed. No pertinent family history.  History  Substance Use Topics  . Smoking status: Current Every Day Smoker -- 1.0 packs/day    Types: Cigarettes  . Smokeless tobacco: Not on file  . Alcohol Use: No      Review of Systems  All other systems reviewed and are negative.    Allergies  Hornet venom and Aspirin  Home Medications   Current Outpatient Rx  Name Route Sig Dispense Refill  . ACETAMINOPHEN 500 MG PO TABS Oral Take 500-1,000 mg by mouth every 6 (six) hours as needed. For pain    . DICLOFENAC SODIUM 75 MG PO TBEC Oral Take 1 tablet (75 mg total) by mouth 2 (two) times daily. 12 tablet 0  . IBUPROFEN 200 MG PO TABS Oral Take 400 mg by mouth every 6 (six) hours as needed. For pain    . NAPROXEN 250 MG PO TABS Oral Take 1 tablet (250 mg total) by mouth 2 (two) times daily with a meal. 14 tablet 0  . NAPROXEN 500 MG PO TABS Oral Take 1 tablet (500 mg total) by mouth 2 (two) times daily. 10 tablet 0    BP 158/92  Pulse 95  Temp 98.6 F (37 C) (Oral)  Resp 14  Ht 6' (1.829 m)  Wt 285 lb (129.275 kg)  BMI  38.65 kg/m2  SpO2 97%  Physical Exam  Nursing note and vitals reviewed. Constitutional: He is oriented to person, place, and time. He appears well-developed and well-nourished.  HENT:  Head: Normocephalic and atraumatic.  Eyes: EOM are normal.  Neck: Normal range of motion.  Cardiovascular: Normal rate, regular rhythm, normal heart sounds and intact distal pulses.   Pulmonary/Chest: Effort normal and breath sounds normal. No respiratory distress.  Abdominal: Soft. He exhibits no distension. There is no tenderness.  Musculoskeletal: Normal range of motion.       Mild pain with range of motion of the shoulder.  Mild pain at left a.c. Joint.  No obvious deformity noted on exam.  Normal left radial pulse.  Normal strength in left grip.  Mild pain with range of motion of left shoulder.  No erythema or warmth of left shoulder joint.  Neurological: He is alert and oriented to person, place, and time.  Skin: Skin is warm and dry.  Psychiatric: He has a normal mood and affect. Judgment normal.    ED Course  Procedures (including critical care time)  Labs Reviewed - No data to display Dg  Shoulder Left  08/06/2012  *RADIOLOGY REPORT*  Clinical Data: Left shoulder pain  LEFT SHOULDER - 2+ VIEW  Comparison: 01/28/2012  Findings: Mild acromioclavicular degenerative change.  Mild high- riding position of the left humeral head.  Internal-external rotation is suboptimal.  No anterior or posterior displacement appreciated on the scapular Y view.  Left upper lung is clear.  IMPRESSION: Mild acromioclavicular degenerative change.  High-riding left humeral head is likely positional, however can be seen in the setting of underlying rotator cuff pathology.   Original Report Authenticated By: Waneta Martins, M.D.     I personally reviewed the imaging tests through PACS system  I reviewed available ER/hospitalization records thought the EMR    1. Left shoulder pain       MDM  Acute on chronic left  shoulder pain.  Pain treated in the emergency department.  X-ray without osseous abnormality.  Discharge home with outpatient orthopedic referral.  Sling for comfort.  Anti-inflammatories.        Lyanne Co, MD 08/07/12 [phone]

## 2012-09-12 ENCOUNTER — Encounter (HOSPITAL_COMMUNITY): Payer: Self-pay | Admitting: Emergency Medicine

## 2012-09-12 ENCOUNTER — Emergency Department (HOSPITAL_COMMUNITY)
Admission: EM | Admit: 2012-09-12 | Discharge: 2012-09-12 | Disposition: A | Discharge status: Home / Self Care | Payer: Self-pay | Attending: Emergency Medicine | Admitting: Emergency Medicine

## 2012-09-12 ENCOUNTER — Emergency Department (HOSPITAL_COMMUNITY): Payer: Self-pay

## 2012-09-12 DIAGNOSIS — Y93H1 Activity, digging, shoveling and raking: Secondary | ICD-10-CM | POA: Insufficient documentation

## 2012-09-12 DIAGNOSIS — S8990XA Unspecified injury of unspecified lower leg, initial encounter: Secondary | ICD-10-CM | POA: Insufficient documentation

## 2012-09-12 DIAGNOSIS — Z9889 Other specified postprocedural states: Secondary | ICD-10-CM | POA: Insufficient documentation

## 2012-09-12 DIAGNOSIS — Y92009 Unspecified place in unspecified non-institutional (private) residence as the place of occurrence of the external cause: Secondary | ICD-10-CM | POA: Insufficient documentation

## 2012-09-12 DIAGNOSIS — Z79899 Other long term (current) drug therapy: Secondary | ICD-10-CM | POA: Insufficient documentation

## 2012-09-12 DIAGNOSIS — M25569 Pain in unspecified knee: Secondary | ICD-10-CM

## 2012-09-12 DIAGNOSIS — F17200 Nicotine dependence, unspecified, uncomplicated: Secondary | ICD-10-CM | POA: Insufficient documentation

## 2012-09-12 DIAGNOSIS — M1710 Unilateral primary osteoarthritis, unspecified knee: Secondary | ICD-10-CM | POA: Insufficient documentation

## 2012-09-12 DIAGNOSIS — S99929A Unspecified injury of unspecified foot, initial encounter: Secondary | ICD-10-CM | POA: Insufficient documentation

## 2012-09-12 DIAGNOSIS — G8929 Other chronic pain: Secondary | ICD-10-CM | POA: Insufficient documentation

## 2012-09-12 DIAGNOSIS — W172XXA Fall into hole, initial encounter: Secondary | ICD-10-CM | POA: Insufficient documentation

## 2012-09-12 MED ORDER — NAPROXEN 500 MG PO TABS: 500.0000 mg | ORAL_TABLET | Freq: Two times a day (BID) | ORAL | Status: DC

## 2012-09-12 MED ORDER — NAPROXEN 250 MG PO TABS
500.0000 mg | ORAL_TABLET | Freq: Once | ORAL | Status: AC
  Administered 2012-09-12: 500 mg via ORAL
  Filled 2012-09-12: qty 2

## 2012-09-12 NOTE — ED Provider Notes (Signed)
History     CSN: [id number]  Arrival date & time 09/12/12  0101   First MD Initiated Contact with Patient 09/12/12 0115      Chief Complaint  Patient presents with  . Knee Pain    (Consider location/radiation/quality/duration/timing/severity/associated sxs/prior treatment) HPI  Steven Barnes is a 41 y.o. male  With a h/o chronic knee painwho presents to the Emergency Department complaining of left knee pain after raking in the yard and stepping into a hole twisting the knee.He has taken no medicines.Pain is worse with palpation and with walking. He is using a cane for support.  Past Medical History  Diagnosis Date  . Knee arthropathy   . Chronic knee pain     Past Surgical History  Procedure Date  . Knee surgery   . Knee arthroscopy and arthrotomy     No family history on file.  History  Substance Use Topics  . Smoking status: Current Every Day Smoker -- 1.0 packs/day    Types: Cigarettes  . Smokeless tobacco: Not on file  . Alcohol Use: No      Review of Systems  Constitutional: Negative for fever.       10 Systems reviewed and are negative for acute change except as noted in the HPI.  HENT: Negative for congestion.   Eyes: Negative for discharge and redness.  Respiratory: Negative for cough and shortness of breath.   Cardiovascular: Negative for chest pain.  Gastrointestinal: Negative for vomiting and abdominal pain.  Musculoskeletal: Negative for back pain.       Left knee pain  Skin: Negative for rash.  Neurological: Negative for syncope, numbness and headaches.  Psychiatric/Behavioral:       No behavior change.    Allergies  Hornet venom and Aspirin  Home Medications   Current Outpatient Rx  Name  Route  Sig  Dispense  Refill  . ACETAMINOPHEN 500 MG PO TABS   Oral   Take 500-1,000 mg by mouth every 6 (six) hours as needed. For pain         . DICLOFENAC SODIUM 75 MG PO TBEC   Oral   Take 1 tablet (75 mg total) by mouth 2 (two) times  daily.   12 tablet   0   . IBUPROFEN 200 MG PO TABS   Oral   Take 400 mg by mouth every 6 (six) hours as needed. For pain         . NAPROXEN 250 MG PO TABS   Oral   Take 1 tablet (250 mg total) by mouth 2 (two) times daily with a meal.   14 tablet   0   . NAPROXEN 500 MG PO TABS   Oral   Take 1 tablet (500 mg total) by mouth 2 (two) times daily.   10 tablet   0     BP 174/80  Pulse 91  Temp 98.2 F (36.8 C) (Oral)  Resp 18  Ht 6' (1.829 m)  Wt 285 lb (129.275 kg)  BMI 38.65 kg/m2  SpO2 96%  Physical Exam  Nursing note and vitals reviewed. Constitutional: He appears well-developed and well-nourished.       Awake, alert, nontoxic appearance.  HENT:  Head: Atraumatic.  Eyes: Right eye exhibits no discharge. Left eye exhibits no discharge.  Neck: Neck supple.  Pulmonary/Chest: Effort normal. He exhibits no tenderness.  Abdominal: Soft. There is no tenderness. There is no rebound.  Musculoskeletal: He exhibits no tenderness.  Baseline ROM, no obvious new focal weakness.Mild effusion to left knee. Tenderness along the medial joint line to palpation. No bruising noted, no lesions.   Neurological:       Mental status and motor strength appears baseline for patient and situation.  Skin: No rash noted.  Psychiatric: He has a normal mood and affect.    ED Course  Procedures (including critical care time)  Labs Reviewed - No data to display Dg Knee Complete 4 Views Left  09/12/2012  *RADIOLOGY REPORT*  Clinical Data: Stepped in hole; twisted left knee, with left knee swelling and pain.  LEFT KNEE - COMPLETE 4+ VIEW  Comparison: Left knee radiographs performed 05/20/2012  Findings: There is no evidence of fracture or dislocation.  There is marked narrowing of the medial compartment, with associated vacuum phenomenon.  Marginal osteophytes are noted arising at the medial and patellofemoral compartments.  Wall osteophytes are also seen.  A small knee joint effusion is  likely present.  The visualized soft tissues are otherwise unremarkable in appearance.  IMPRESSION:  1.  No evidence of fracture or dislocation. 2.  Chronic degenerative changes again noted at the left knee, with vacuum phenomenon and marked narrowing at the medial compartment. 3.  Likely small knee joint effusion.   Original Report Authenticated By: Tonia Ghent, M.D.         MDM  Patient with chronic knee pain and arthritis in the knee here with injury to left knee. Xrays show degenerative changes and small effusion. Given naproxen and placed in knee immobilizer. Referral to orthopedist.Pt stable in ED with no significant deterioration in condition.The patient appears reasonably screened and/or stabilized for discharge and I doubt any other medical condition or other [facility] requiring further screening, evaluation, or treatment in the ED at this time prior to discharge. .      MDM Reviewed: nursing note and vitals Interpretation: x-ray           Nicoletta Dress. Colon Branch, MD 09/12/12 [phone]

## 2012-09-12 NOTE — ED Notes (Signed)
Patient states he was raking leaves yesterday afternoon and stepped in a hole and twisted his left knee.

## 2012-09-29 IMAGING — CR DG SHOULDER 2+V*L*
3 series · 3 of 3 positions shown · non-contrast
Comparison: None.

CLINICAL DATA: Left shoulder pain.

LEFT SHOULDER - 2+ VIEW

[view not recorded (1 of 3)]
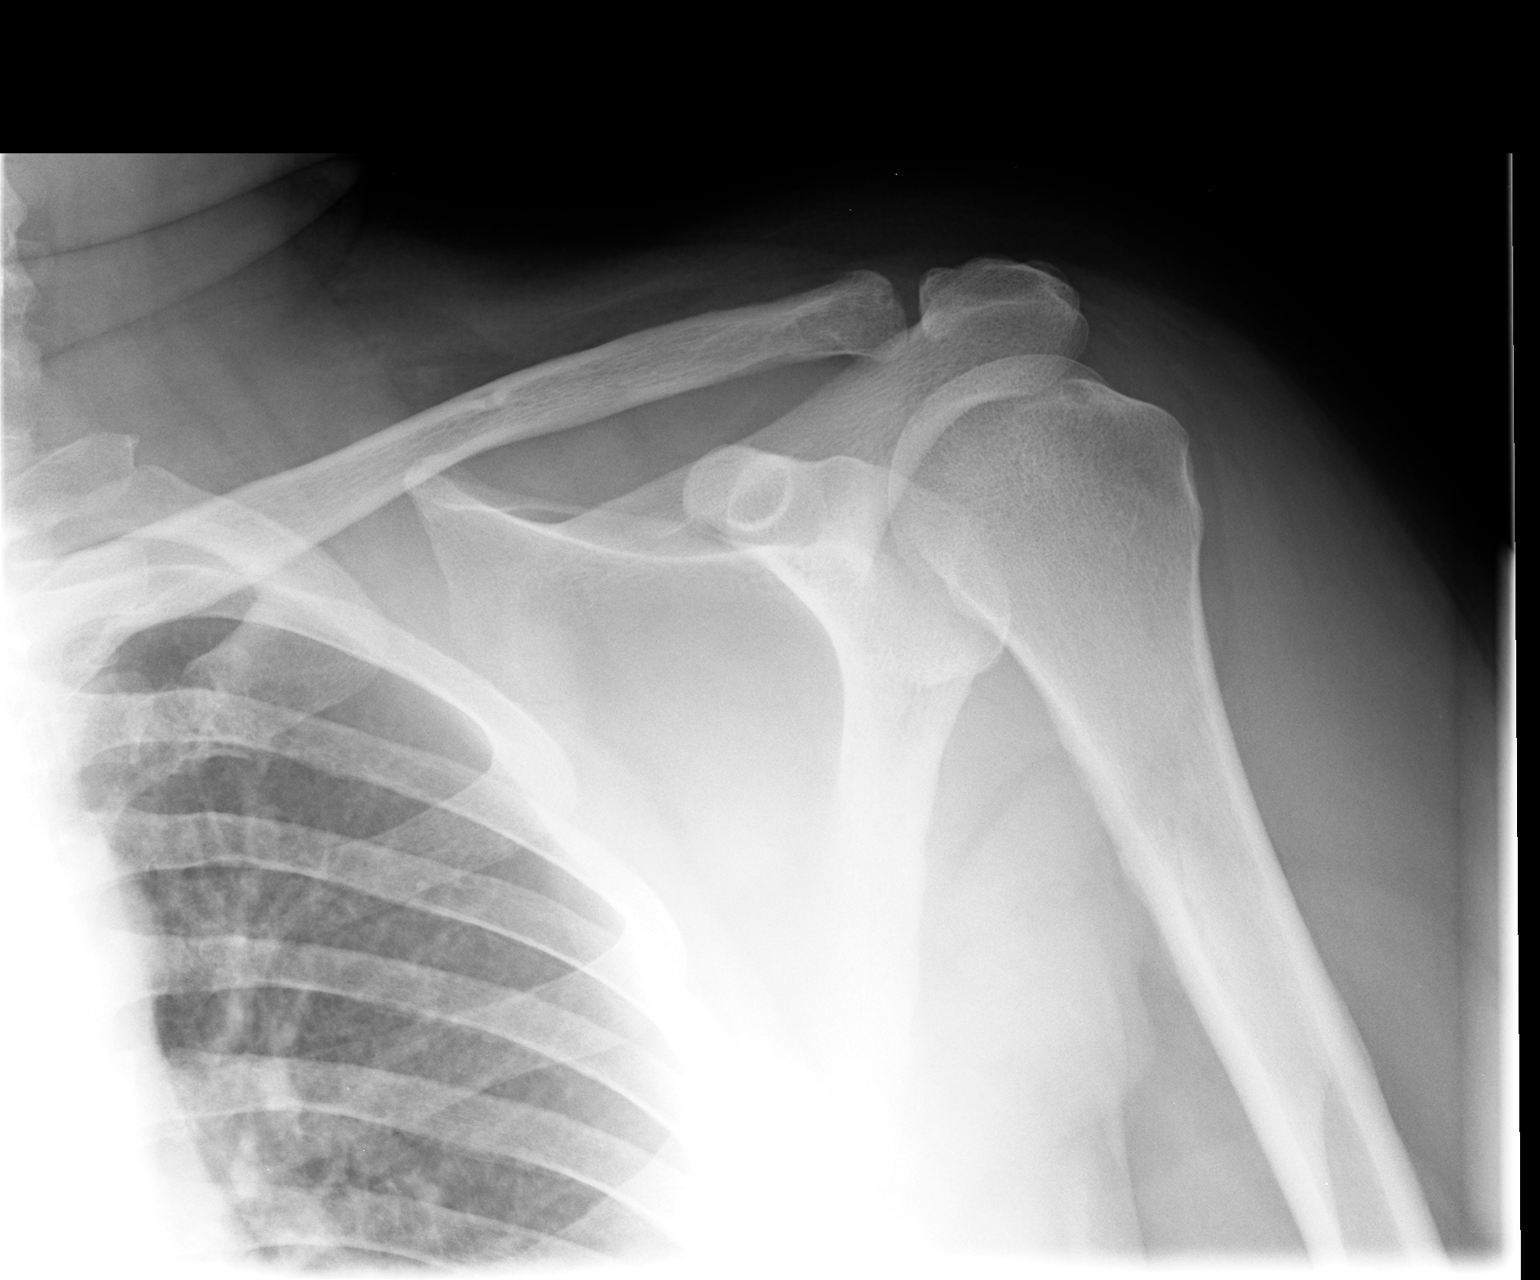

[view not recorded (2 of 3)]
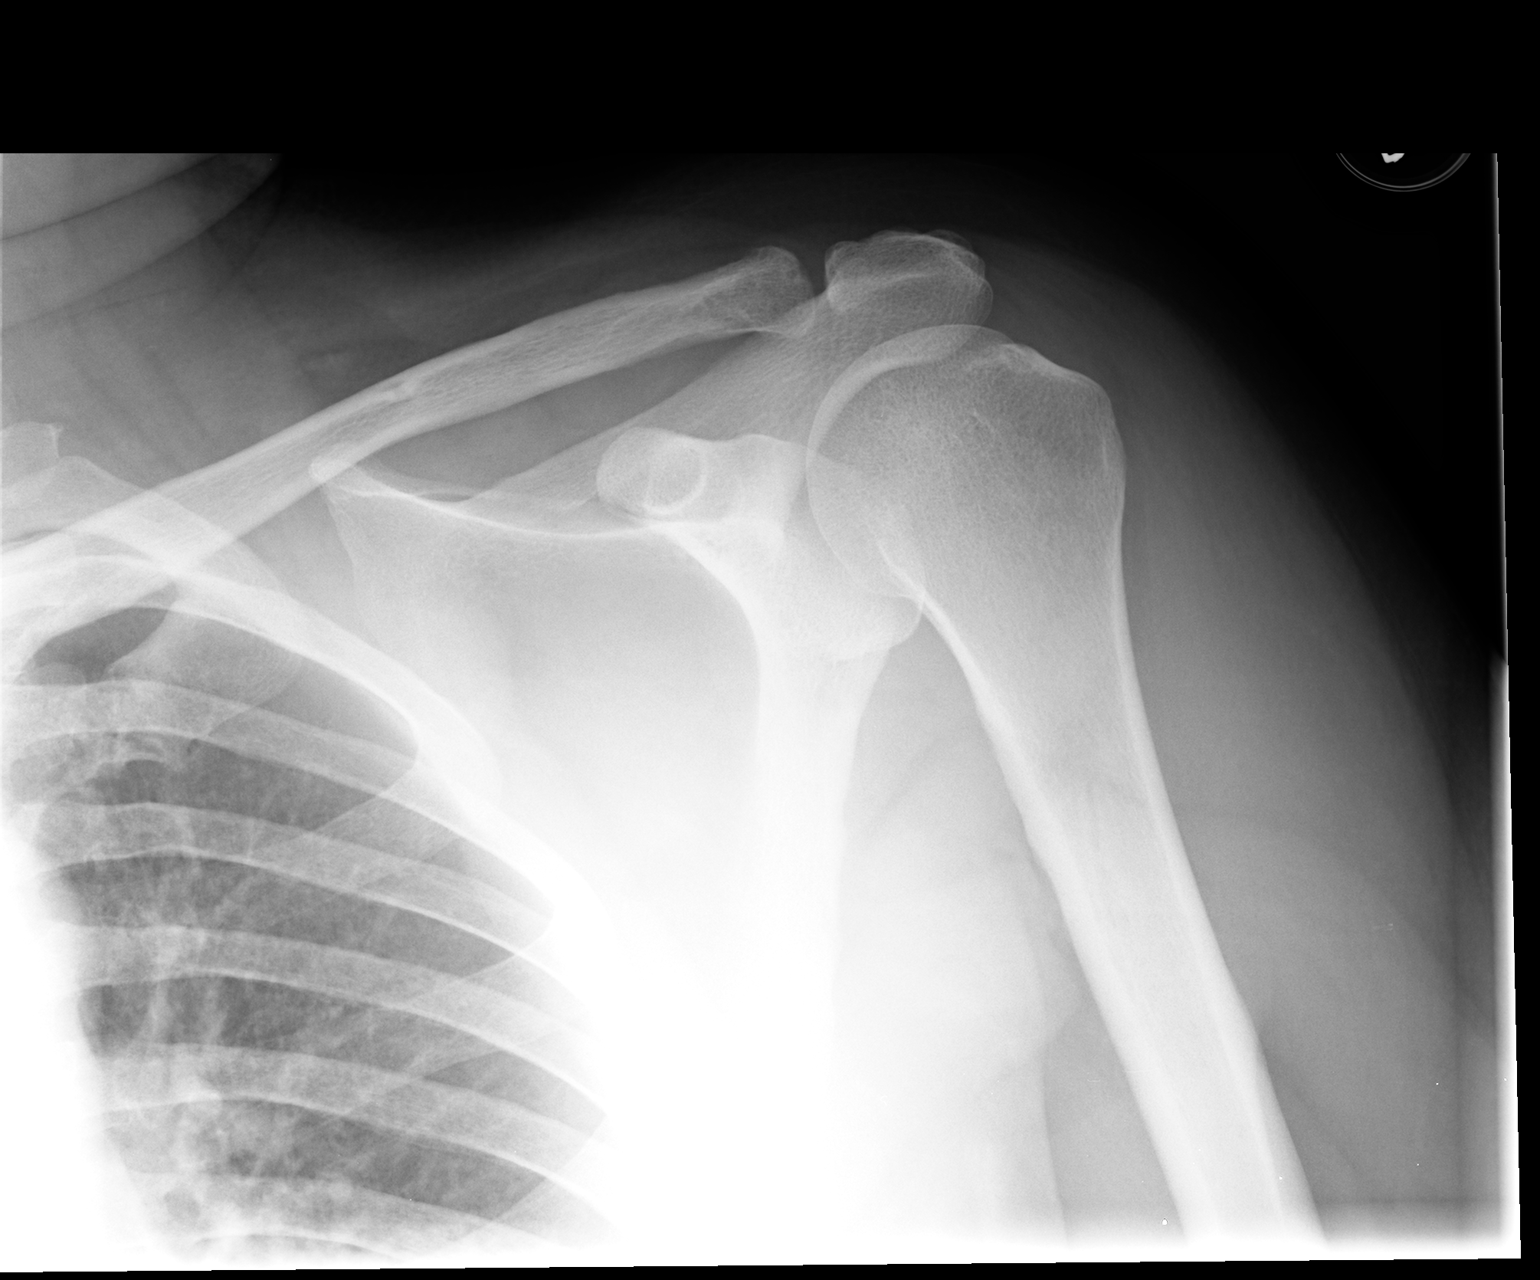

[view not recorded (3 of 3)]
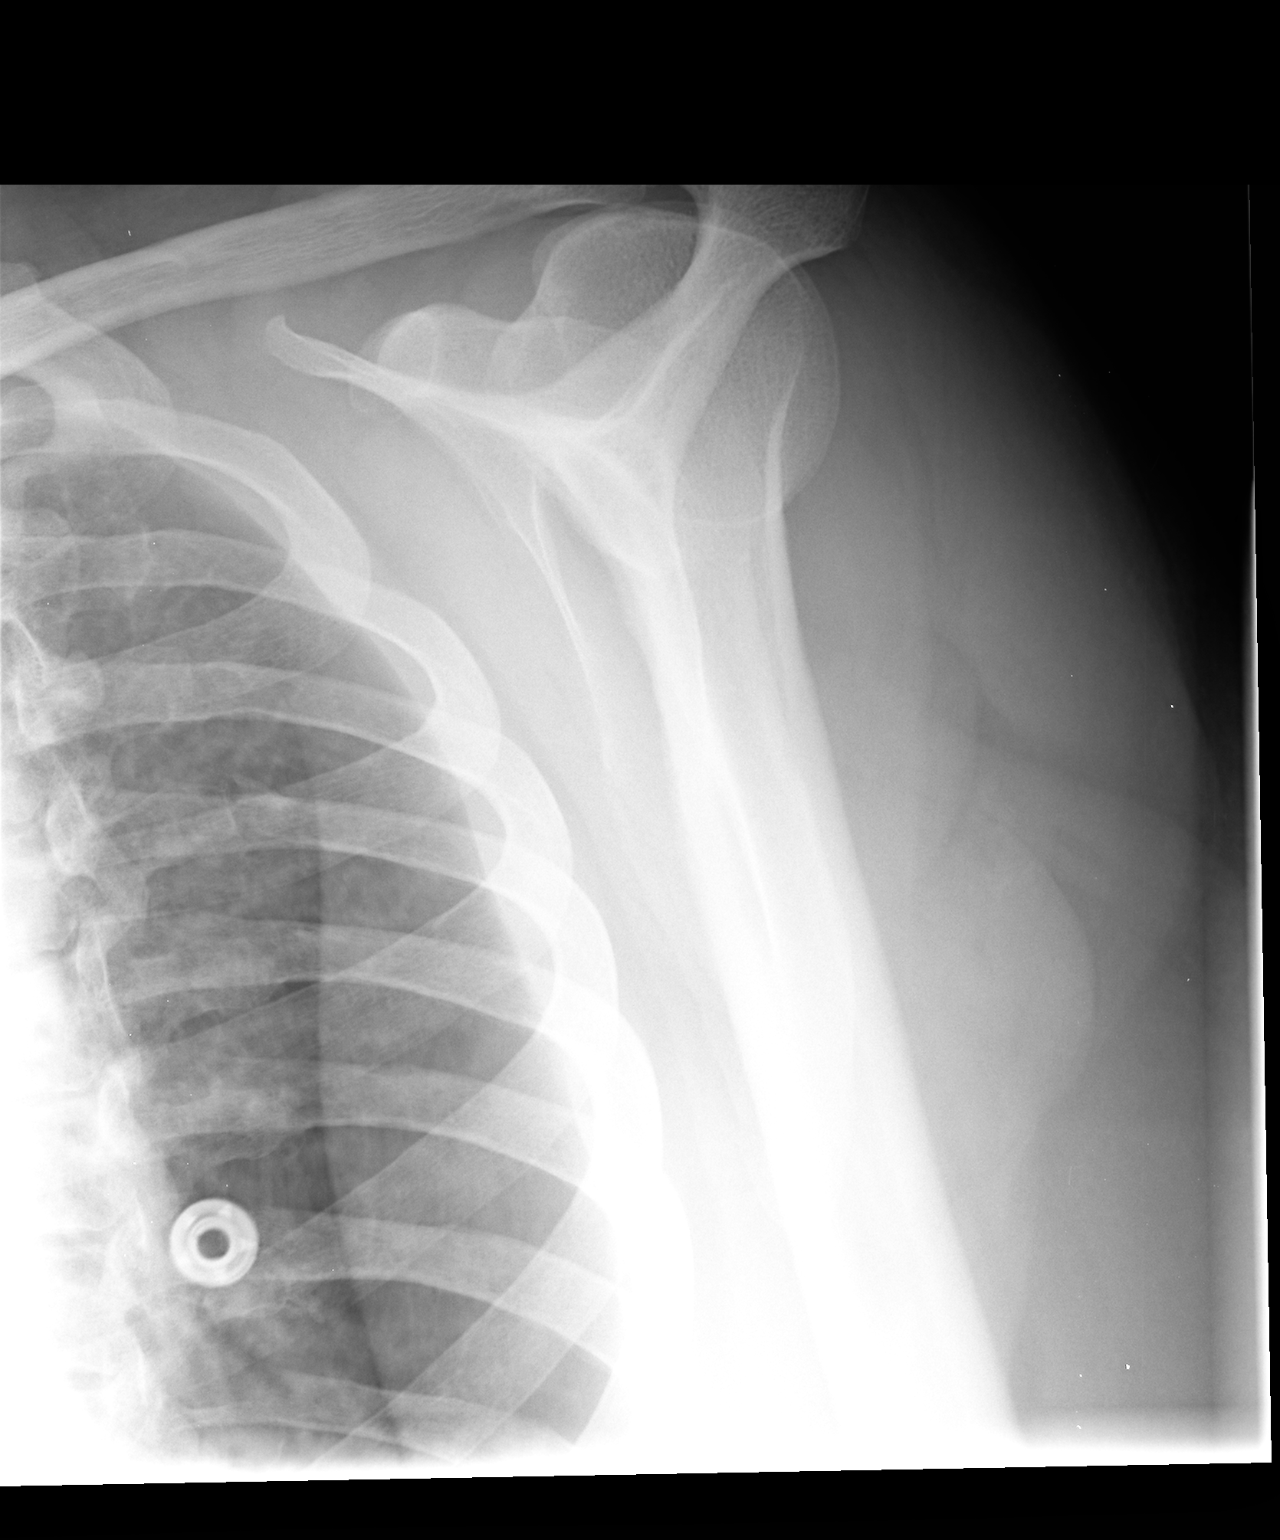

[3 of 3 positions shown; findings below may reference images not displayed]

FINDINGS: Left shoulder is located.  There is no fracture.  The AC
joint appears within normal limits.
IMPRESSION: Negative.

## 2012-11-18 ENCOUNTER — Emergency Department (HOSPITAL_COMMUNITY)
Admission: EM | Admit: 2012-11-18 | Discharge: 2012-11-18 | Disposition: A | Discharge status: Home / Self Care | Payer: Self-pay | Attending: Emergency Medicine | Admitting: Emergency Medicine

## 2012-11-18 ENCOUNTER — Encounter (HOSPITAL_COMMUNITY): Payer: Self-pay

## 2012-11-18 ENCOUNTER — Emergency Department (HOSPITAL_COMMUNITY): Payer: Self-pay

## 2012-11-18 DIAGNOSIS — Z79899 Other long term (current) drug therapy: Secondary | ICD-10-CM | POA: Insufficient documentation

## 2012-11-18 DIAGNOSIS — S335XXA Sprain of ligaments of lumbar spine, initial encounter: Secondary | ICD-10-CM | POA: Insufficient documentation

## 2012-11-18 DIAGNOSIS — T733XXA Exhaustion due to excessive exertion, initial encounter: Secondary | ICD-10-CM | POA: Insufficient documentation

## 2012-11-18 DIAGNOSIS — F17200 Nicotine dependence, unspecified, uncomplicated: Secondary | ICD-10-CM | POA: Insufficient documentation

## 2012-11-18 DIAGNOSIS — S39012A Strain of muscle, fascia and tendon of lower back, initial encounter: Secondary | ICD-10-CM

## 2012-11-18 DIAGNOSIS — Y9389 Activity, other specified: Secondary | ICD-10-CM | POA: Insufficient documentation

## 2012-11-18 DIAGNOSIS — M25569 Pain in unspecified knee: Secondary | ICD-10-CM | POA: Insufficient documentation

## 2012-11-18 DIAGNOSIS — G8929 Other chronic pain: Secondary | ICD-10-CM | POA: Insufficient documentation

## 2012-11-18 DIAGNOSIS — Y92009 Unspecified place in unspecified non-institutional (private) residence as the place of occurrence of the external cause: Secondary | ICD-10-CM | POA: Insufficient documentation

## 2012-11-18 MED ORDER — HYDROMORPHONE HCL PF 1 MG/ML IJ SOLN
1.0000 mg | Freq: Once | INTRAMUSCULAR | Status: AC
  Administered 2012-11-18: 1 mg via INTRAMUSCULAR
  Filled 2012-11-18: qty 1

## 2012-11-18 MED ORDER — OXYCODONE-ACETAMINOPHEN 5-325 MG PO TABS: 1.0000 | ORAL_TABLET | ORAL | Status: AC | PRN

## 2012-11-18 MED ORDER — ONDANSETRON 8 MG PO TBDP
8.0000 mg | ORAL_TABLET | Freq: Once | ORAL | Status: AC
  Administered 2012-11-18: 8 mg via ORAL
  Filled 2012-11-18: qty 1

## 2012-11-18 MED ORDER — CYCLOBENZAPRINE HCL 5 MG PO TABS: 5.0000 mg | ORAL_TABLET | Freq: Three times a day (TID) | ORAL | Status: DC | PRN

## 2012-11-18 NOTE — ED Provider Notes (Signed)
Medical screening examination/treatment/procedure(s) were performed by non-physician practitioner and as supervising physician I was immediately available for consultation/collaboration.  Donnetta Hutching, MD 11/18/12 1435

## 2012-11-18 NOTE — ED Provider Notes (Signed)
History     CSN: [id number]  Arrival date & time 11/18/12  1018   First MD Initiated Contact with Patient 11/18/12 1029      Chief Complaint  Patient presents with  . Back Pain    (Consider location/radiation/quality/duration/timing/severity/associated sxs/prior treatment) HPI Comments: Steven Barnes  presents with acute on chronic low back pain which has which has been present for the past 2 days since working under his house to repair a broken water line.  He denies any specific injury,  Stating the pain gradually worsened as he was taking a hot shower after the activity.  There is radiation into his left hip  There has been no weakness or numbness in the lower extremities and no urinary or bowel retention or incontinence.  Patient does not have a history of cancer or IVDU.   The history is provided by the patient.    Past Medical History  Diagnosis Date  . Knee arthropathy   . Chronic knee pain     Past Surgical History  Procedure Date  . Knee surgery   . Knee arthroscopy and arthrotomy     No family history on file.  History  Substance Use Topics  . Smoking status: Current Every Day Smoker -- 1.0 packs/day    Types: Cigarettes  . Smokeless tobacco: Not on file  . Alcohol Use: No      Review of Systems  Constitutional: Negative for fever.  Respiratory: Negative for shortness of breath.   Cardiovascular: Negative for chest pain and leg swelling.  Gastrointestinal: Negative for abdominal pain, constipation and abdominal distention.  Genitourinary: Negative for dysuria, urgency, frequency, flank pain and difficulty urinating.  Musculoskeletal: Positive for back pain. Negative for joint swelling and gait problem.  Skin: Negative for rash.  Neurological: Negative for weakness and numbness.    Allergies  Hornet venom and Aspirin  Home Medications   Current Outpatient Rx  Name  Route  Sig  Dispense  Refill  . ACETAMINOPHEN 500 MG PO TABS   Oral   Take  500-1,000 mg by mouth every 6 (six) hours as needed. For pain         . IBUPROFEN 200 MG PO TABS   Oral   Take 400 mg by mouth every 6 (six) hours as needed. For pain         . CYCLOBENZAPRINE HCL 5 MG PO TABS   Oral   Take 1 tablet (5 mg total) by mouth 3 (three) times daily as needed for muscle spasms.   15 tablet   0   . OXYCODONE-ACETAMINOPHEN 5-325 MG PO TABS   Oral   Take 1 tablet by mouth every 4 (four) hours as needed for pain.   20 tablet   0     BP 147/82  Pulse 82  Temp 97.8 F (36.6 C) (Oral)  Resp 20  Ht 6' (1.829 m)  Wt 285 lb (129.275 kg)  BMI 38.65 kg/m2  SpO2 99%  Physical Exam  Nursing note and vitals reviewed. Constitutional: He appears well-developed and well-nourished.  HENT:  Head: Normocephalic.  Eyes: Conjunctivae normal are normal.  Neck: Normal range of motion. Neck supple.  Cardiovascular: Normal rate and intact distal pulses.        Pedal pulses normal.  Pulmonary/Chest: Effort normal.  Abdominal: Soft. Bowel sounds are normal. He exhibits no distension and no mass.  Musculoskeletal: Normal range of motion. He exhibits no edema.       Lumbar back:  He exhibits tenderness and spasm. He exhibits no bony tenderness, no swelling and no edema.       Back:  Neurological: He is alert. He has normal strength. He displays no atrophy and no tremor. No sensory deficit. Gait normal.  Reflex Scores:      Patellar reflexes are 2+ on the right side and 2+ on the left side.      Achilles reflexes are 2+ on the right side and 2+ on the left side.      No strength deficit noted in hip and knee flexor and extensor muscle groups.  Ankle flexion and extension intact.  Skin: Skin is warm and dry.  Psychiatric: He has a normal mood and affect.    ED Course  Procedures (including critical care time)  Labs Reviewed - No data to display Dg Lumbar Spine Complete  11/18/2012  *RADIOLOGY REPORT*  Clinical Data: Low back injury, pain  LUMBAR SPINE -  COMPLETE 4+ VIEW  Comparison: 04/03/2007  Findings: Minor endplate bony spurring diffusely.  Normal lumbar spine alignment.  No compression fracture, wedge shaped deformity or focal kyphosis.  Facets aligned.  Pedicles are intact.  No pars defects.  Normal SI joints and bowel gas pattern.  Stable pelvic calcifications consistent with venous phleboliths.  IMPRESSION: Minor endplate degenerative changes.  No acute osseous abnormality   Original Report Authenticated By: Judie Petit. Shick, M.D.      1. Lumbar strain       MDM  Heat therapy, prescribed oxycodone,  Flexeril.  No neuro deficit on exam or by history to suggest emergent or surgical presentation.  Also discussed worsened sx that should prompt immediate re-evaluation including distal weakness, bowel/bladder retention/incontinence.  Referral given              [name], [place] 11/18/12 1232

## 2012-11-18 NOTE — ED Notes (Signed)
Pt reports  Was working under his house on a water pipe yesterday and started having pain in lower back and left hip.

## 2012-12-09 ENCOUNTER — Emergency Department (HOSPITAL_COMMUNITY)
Admission: EM | Admit: 2012-12-09 | Discharge: 2012-12-09 | Disposition: A | Discharge status: Home / Self Care | Payer: Self-pay | Attending: Emergency Medicine | Admitting: Emergency Medicine

## 2012-12-09 ENCOUNTER — Encounter (HOSPITAL_COMMUNITY): Payer: Self-pay | Admitting: *Deleted

## 2012-12-09 ENCOUNTER — Emergency Department (HOSPITAL_COMMUNITY): Payer: Self-pay

## 2012-12-09 DIAGNOSIS — G8929 Other chronic pain: Secondary | ICD-10-CM | POA: Insufficient documentation

## 2012-12-09 DIAGNOSIS — S8990XA Unspecified injury of unspecified lower leg, initial encounter: Secondary | ICD-10-CM | POA: Insufficient documentation

## 2012-12-09 DIAGNOSIS — W108XXA Fall (on) (from) other stairs and steps, initial encounter: Secondary | ICD-10-CM | POA: Insufficient documentation

## 2012-12-09 DIAGNOSIS — M1710 Unilateral primary osteoarthritis, unspecified knee: Secondary | ICD-10-CM | POA: Insufficient documentation

## 2012-12-09 DIAGNOSIS — Y92009 Unspecified place in unspecified non-institutional (private) residence as the place of occurrence of the external cause: Secondary | ICD-10-CM | POA: Insufficient documentation

## 2012-12-09 DIAGNOSIS — F17200 Nicotine dependence, unspecified, uncomplicated: Secondary | ICD-10-CM | POA: Insufficient documentation

## 2012-12-09 DIAGNOSIS — M549 Dorsalgia, unspecified: Secondary | ICD-10-CM | POA: Insufficient documentation

## 2012-12-09 DIAGNOSIS — Y9389 Activity, other specified: Secondary | ICD-10-CM | POA: Insufficient documentation

## 2012-12-09 MED ORDER — IBUPROFEN 600 MG PO TABS: 600.0000 mg | ORAL_TABLET | Freq: Three times a day (TID) | ORAL | Status: DC | PRN

## 2012-12-09 MED ORDER — HYDROMORPHONE HCL PF 2 MG/ML IJ SOLN
2.0000 mg | Freq: Once | INTRAMUSCULAR | Status: AC
  Administered 2012-12-09: 2 mg via INTRAMUSCULAR
  Filled 2012-12-09: qty 1

## 2012-12-09 MED ORDER — HYDROMORPHONE HCL PF 2 MG/ML IJ SOLN
INTRAMUSCULAR | Status: AC
  Filled 2012-12-09: qty 1

## 2012-12-09 NOTE — ED Notes (Signed)
Pt states he sipped and fell last night. Pain to lower back and left knee. NAD

## 2012-12-09 NOTE — ED Provider Notes (Signed)
History     CSN: [id number]  Arrival date & time 12/09/12  1321   First MD Initiated Contact with Patient 12/09/12 1340      Chief Complaint  Patient presents with  . Back Pain  . Knee Pain    The history is provided by the patient and medical records.   patient reports walking down steps last night at his house when he slipped injuring his left knee and his low back.  He did not hit his head.  He denies neck pain.  No weakness of his upper lower extremities.  The majority of pain is located in his left knee.  His pain is moderate in severity at this time.  He also has pain in his left lower back.  He denies urinary hesitancy or retention.  No fevers or chills.  The patient has been seen emergency room multiple times for chronic left knee pain and back pain.  He is in the emergency department 2 weeks ago for back pain which point he received a prescription for Percocet  Past Medical History  Diagnosis Date  . Knee arthropathy   . Chronic knee pain     Past Surgical History  Procedure Date  . Knee surgery   . Knee arthroscopy and arthrotomy     No family history on file.  History  Substance Use Topics  . Smoking status: Current Every Day Smoker -- 1.0 packs/day    Types: Cigarettes  . Smokeless tobacco: Not on file  . Alcohol Use: No      Review of Systems  Musculoskeletal: Positive for back pain.  All other systems reviewed and are negative.    Allergies  Hornet venom and Aspirin  Home Medications   Current Outpatient Rx  Name  Route  Sig  Dispense  Refill  . ACETAMINOPHEN 500 MG PO TABS   Oral   Take 500-1,000 mg by mouth every 6 (six) hours as needed. For pain         . IBUPROFEN 200 MG PO TABS   Oral   Take 400 mg by mouth every 6 (six) hours as needed. For pain           BP 150/82  Pulse 90  Temp 98.1 F (36.7 C)  Resp 18  Ht 6' (1.829 m)  Wt 285 lb (129.275 kg)  BMI 38.65 kg/m2  SpO2 98%  Physical Exam  Nursing note and vitals  reviewed. Constitutional: He is oriented to person, place, and time. He appears well-developed and well-nourished.  HENT:  Head: Normocephalic and atraumatic.  Eyes: EOM are normal.  Neck: Normal range of motion.  Cardiovascular: Normal rate, regular rhythm, normal heart sounds and intact distal pulses.   Pulmonary/Chest: Effort normal and breath sounds normal. No respiratory distress.  Abdominal: Soft. He exhibits no distension. There is no tenderness.  Musculoskeletal:       Range of motion of left knee.  Normal pulses in his left foot.  Compartments are soft.  No warmth of his left knee.  No significant joint effusion noted of the left knee.  Patient with L-spine tenderness as well as paralumbar tenderness.  No L-spine step-offs.  No bruising noted.  Neurological: He is alert and oriented to person, place, and time.       5 out of 5 strength in bilateral lower extremity major muscle groups  Skin: Skin is warm and dry.  Psychiatric: He has a normal mood and affect. Judgment normal.  ED Course  Procedures (including critical care time)  Labs Reviewed - No data to display Dg Lumbar Spine Complete  12/09/2012  *RADIOLOGY REPORT*  Clinical Data: Post fall, now with low back pain  LUMBAR SPINE - COMPLETE 4+ VIEW  Comparison: 11/18/2012; 04/03/2007  Findings:  There are five non-rib bearing lumbar type vertebral bodies.  Normal alignment of the lumbar spine.  No anterolisthesis or retrolisthesis.  No definite pars defects.  There is unchanged mild (<25%) height loss of the superior endplate of the T12 vertebral body grossly unchanged since the 2008 examination.  Remaining vertebral body heights are preserved.  There is grossly unchanged very mild multilevel DDD most conspicuous at L1 - L2, L2 - L3 and L3 - L4 with disc space height loss, end plate irregularity and osteophytosis.  Limited visualization of the bilateral SI joints is normal. Regional bowel gas pattern and soft tissues are normal.   IMPRESSION: 1.  No acute findings 2.  Grossly unchanged mild multilevel DDD.   Original Report Authenticated By: Tacey Ruiz, MD    Dg Knee Complete 4 Views Left  12/09/2012  *RADIOLOGY REPORT*  Clinical Data: Post fall, now with medial left-sided knee pain  LEFT KNEE - COMPLETE 4+ VIEW  Comparison: 09/12/2012  Findings:  No fracture or dislocation.  Redemonstrated severe degenerative change of the knee, worse within the patellofemoral joint and medial compartment with joint space loss, subchondral sclerosis and osteophytosis.  No definite evidence of chondrocalcinosis.  No definite suprapatellar joint effusion.  Regional soft tissues are normal.  IMPRESSION: 1.  No acute findings. 2.  Redemonstrated severe degenerative change of the knee.   Original Report Authenticated By: Tacey Ruiz, MD    I personally reviewed the imaging tests through PACS system I reviewed available ER/hospitalization records through the EMR   1. Chronic knee pain   2. Chronic back pain       MDM  This appears to be arthritic changes of his left knee.  Possible left knee sprain.  Likely lumbar strain.  Discharge home in good condition with orthopedic followup        Lyanne Co, MD 12/09/12 1606

## 2013-04-02 ENCOUNTER — Encounter (HOSPITAL_COMMUNITY): Payer: Self-pay | Admitting: *Deleted

## 2013-04-02 ENCOUNTER — Emergency Department (HOSPITAL_COMMUNITY)
Admission: EM | Admit: 2013-04-02 | Discharge: 2013-04-02 | Disposition: A | Discharge status: Home / Self Care | Payer: Self-pay | Attending: Emergency Medicine | Admitting: Emergency Medicine

## 2013-04-02 DIAGNOSIS — Y9389 Activity, other specified: Secondary | ICD-10-CM | POA: Insufficient documentation

## 2013-04-02 DIAGNOSIS — F17200 Nicotine dependence, unspecified, uncomplicated: Secondary | ICD-10-CM | POA: Insufficient documentation

## 2013-04-02 DIAGNOSIS — M25562 Pain in left knee: Secondary | ICD-10-CM

## 2013-04-02 DIAGNOSIS — X500XXA Overexertion from strenuous movement or load, initial encounter: Secondary | ICD-10-CM | POA: Insufficient documentation

## 2013-04-02 DIAGNOSIS — Y9289 Other specified places as the place of occurrence of the external cause: Secondary | ICD-10-CM | POA: Insufficient documentation

## 2013-04-02 DIAGNOSIS — Z96659 Presence of unspecified artificial knee joint: Secondary | ICD-10-CM | POA: Insufficient documentation

## 2013-04-02 DIAGNOSIS — M1710 Unilateral primary osteoarthritis, unspecified knee: Secondary | ICD-10-CM | POA: Insufficient documentation

## 2013-04-02 DIAGNOSIS — S8990XA Unspecified injury of unspecified lower leg, initial encounter: Secondary | ICD-10-CM | POA: Insufficient documentation

## 2013-04-02 DIAGNOSIS — Y990 Civilian activity done for income or pay: Secondary | ICD-10-CM | POA: Insufficient documentation

## 2013-04-02 MED ORDER — NAPROXEN 500 MG PO TABS: 500.0000 mg | ORAL_TABLET | Freq: Two times a day (BID) | ORAL | Status: DC

## 2013-04-02 MED ORDER — OXYCODONE-ACETAMINOPHEN 5-325 MG PO TABS: 1.0000 | ORAL_TABLET | ORAL | Status: DC | PRN

## 2013-04-02 NOTE — ED Notes (Signed)
Pain lt knee since last night.  Has been working in the garden and thinks may have aggravated his knee

## 2013-04-02 NOTE — ED Notes (Signed)
Lt knee pain, Hurts with movement.  Steven Barnes

## 2013-04-06 NOTE — ED Provider Notes (Signed)
History     CSN: [id number]  Arrival date & time 04/02/13  1131   First MD Initiated Contact with Patient 04/02/13 1221      Chief Complaint  Patient presents with  . Knee Pain    (Consider location/radiation/quality/duration/timing/severity/associated sxs/prior treatment) Patient is a 42 y.o. male presenting with knee pain. The history is provided by the patient.  Knee Pain Location:  Knee Time since incident: on the evening prior to ED arrival. Injury: yes   Mechanism of injury comment:  "twisted my knee while I was working in the garden" Knee location:  L knee Pain details:    Quality:  Aching and throbbing   Radiates to:  Does not radiate   Severity:  Moderate   Onset quality:  Sudden   Timing:  Constant   Progression:  Unchanged Chronicity:  Recurrent Dislocation: no   Foreign body present:  No foreign bodies Prior injury to area:  Yes Relieved by:  Rest Worsened by:  Activity, bearing weight and flexion Ineffective treatments:  Acetaminophen Associated symptoms: no back pain, no decreased ROM, no fatigue, no fever, no itching, no muscle weakness, no neck pain, no numbness, no stiffness, no swelling and no tingling     Past Medical History  Diagnosis Date  . Knee arthropathy   . Chronic knee pain     Past Surgical History  Procedure Laterality Date  . Knee surgery    . Knee arthroscopy and arthrotomy      History reviewed. No pertinent family history.  History  Substance Use Topics  . Smoking status: Current Every Day Smoker -- 1.00 packs/day    Types: Cigarettes  . Smokeless tobacco: Not on file  . Alcohol Use: No      Review of Systems  Constitutional: Negative for fever, chills and fatigue.  HENT: Negative for neck pain.   Genitourinary: Negative for dysuria and difficulty urinating.  Musculoskeletal: Positive for joint swelling and arthralgias. Negative for back pain and stiffness.  Skin: Negative for color change, itching and wound.  All  other systems reviewed and are negative.    Allergies  Hornet venom and Aspirin  Home Medications   Current Outpatient Rx  Name  Route  Sig  Dispense  Refill  . acetaminophen (TYLENOL) 500 MG tablet   Oral   Take 500-1,000 mg by mouth every 6 (six) hours as needed. For pain         . ibuprofen (ADVIL,MOTRIN) 200 MG tablet   Oral   Take 400 mg by mouth every 6 (six) hours as needed. For pain         . naproxen (NAPROSYN) 500 MG tablet   Oral   Take 1 tablet (500 mg total) by mouth 2 (two) times daily with a meal.   20 tablet   0   . oxyCODONE-acetaminophen (PERCOCET/ROXICET) 5-325 MG per tablet   Oral   Take 1 tablet by mouth every 4 (four) hours as needed for pain.   10 tablet   0     BP 139/77  Pulse 92  Temp(Src) 98.3 F (36.8 C) (Oral)  Resp 18  Ht 6' (1.829 m)  Wt 280 lb (127.007 kg)  BMI 37.97 kg/m2  SpO2 98%  Physical Exam  Nursing note and vitals reviewed. Constitutional: He is oriented to person, place, and time. He appears well-developed and well-nourished. No distress.  Cardiovascular: Normal rate, regular rhythm, normal heart sounds and intact distal pulses.   Pulmonary/Chest: Effort normal and breath  sounds normal.  Musculoskeletal: He exhibits tenderness. He exhibits no edema.  ttp of the left anterior knee.  No effusion, erythema, excessive warmth, bruising or step -off  deformity.  Pt has full ROM of the knee, with pain reproduced with full flexion.    Neurological: He is alert and oriented to person, place, and time. He exhibits normal muscle tone. Coordination normal.  Skin: Skin is warm and dry. No erythema.    ED Course  Procedures (including critical care time)  Labs Reviewed - No data to display No results found.   1. Left knee pain       MDM    Patient with acute on chronic left knee pain.  No erythema, effusion, or excessive warmth of the joint.  Doubt septic joint.  Pt agrees to elevate, ice and f/u with orthopedics.           Prisilla Kocsis L. Trisha Mangle, PA-C 04/06/13 2124

## 2013-04-07 NOTE — ED Provider Notes (Signed)
Medical screening examination/treatment/procedure(s) were performed by non-physician practitioner and as supervising physician I was immediately available for consultation/collaboration.   Kalaysia Demonbreun L Darnella Zeiter, MD 04/07/13 0710 

## 2013-08-21 ENCOUNTER — Emergency Department (HOSPITAL_COMMUNITY): Payer: Self-pay

## 2013-08-21 ENCOUNTER — Encounter (HOSPITAL_COMMUNITY): Payer: Self-pay | Admitting: Emergency Medicine

## 2013-08-21 ENCOUNTER — Emergency Department (HOSPITAL_COMMUNITY)
Admission: EM | Admit: 2013-08-21 | Discharge: 2013-08-21 | Disposition: A | Discharge status: Home / Self Care | Payer: Self-pay | Attending: Emergency Medicine | Admitting: Emergency Medicine

## 2013-08-21 DIAGNOSIS — S8992XA Unspecified injury of left lower leg, initial encounter: Secondary | ICD-10-CM

## 2013-08-21 DIAGNOSIS — Z79899 Other long term (current) drug therapy: Secondary | ICD-10-CM | POA: Insufficient documentation

## 2013-08-21 DIAGNOSIS — Y9389 Activity, other specified: Secondary | ICD-10-CM | POA: Insufficient documentation

## 2013-08-21 DIAGNOSIS — X500XXA Overexertion from strenuous movement or load, initial encounter: Secondary | ICD-10-CM | POA: Insufficient documentation

## 2013-08-21 DIAGNOSIS — W1809XA Striking against other object with subsequent fall, initial encounter: Secondary | ICD-10-CM | POA: Insufficient documentation

## 2013-08-21 DIAGNOSIS — S8990XA Unspecified injury of unspecified lower leg, initial encounter: Secondary | ICD-10-CM | POA: Insufficient documentation

## 2013-08-21 DIAGNOSIS — F17200 Nicotine dependence, unspecified, uncomplicated: Secondary | ICD-10-CM | POA: Insufficient documentation

## 2013-08-21 DIAGNOSIS — Y929 Unspecified place or not applicable: Secondary | ICD-10-CM | POA: Insufficient documentation

## 2013-08-21 MED ORDER — HYDROCODONE-ACETAMINOPHEN 5-325 MG PO TABS: 1.0000 | ORAL_TABLET | ORAL | Status: DC | PRN

## 2013-08-21 MED ORDER — OXYCODONE-ACETAMINOPHEN 5-325 MG PO TABS
1.0000 | ORAL_TABLET | Freq: Once | ORAL | Status: AC
  Administered 2013-08-21: 1 via ORAL
  Filled 2013-08-21: qty 1

## 2013-08-21 NOTE — ED Provider Notes (Signed)
CSN: [id number]     Arrival date & time 08/21/13  1410 History   First MD Initiated Contact with Patient 08/21/13 1509     Chief Complaint  Patient presents with  . Knee Pain   (Consider location/radiation/quality/duration/timing/severity/associated sxs/prior Treatment) Patient is a 42 y.o. male presenting with knee pain. The history is provided by the patient.  Knee Pain Location:  Knee Time since incident:  16 hours Injury: yes   Mechanism of injury: fall   Fall:    Fall occurred:  Down stairs   Impact surface:  Psychiatric nurse of impact:  Knees   Entrapped after fall: no   Knee location:  L knee Pain details:    Quality:  Shooting and sharp   Radiates to:  Does not radiate   Severity:  Severe   Onset quality:  Sudden   Timing:  Constant   Progression:  Unchanged Chronicity:  New Dislocation: no   Foreign body present:  No foreign bodies Prior injury to area:  No Relieved by:  Nothing Worsened by:  Bearing weight Ineffective treatments:  Acetaminophen Associated symptoms: swelling   Associated symptoms: no fever    JOEY LIERMAN is a 42 y.o. male who presents to the ED with left knee pain. He went outside last night to roll up his car windows and slipped on the wet steps. He twisted his left knee and then it hit the corner of the steps. He states that he has had pain all night. He has taken tylenol without relief. Patient ambulatory to the ED.  Past Medical History  Diagnosis Date  . Knee arthropathy   . Chronic knee pain    Past Surgical History  Procedure Laterality Date  . Knee surgery    . Knee arthroscopy and arthrotomy     History reviewed. No pertinent family history. History  Substance Use Topics  . Smoking status: Current Every Day Smoker -- 1.00 packs/day    Types: Cigarettes  . Smokeless tobacco: Not on file  . Alcohol Use: No    Review of Systems  Constitutional: Negative for fever and chills.  HENT: Negative for sore throat.    Gastrointestinal: Negative for nausea and vomiting.  Musculoskeletal:       Left knee pain  Skin: Negative for wound.  Neurological: Negative for numbness and headaches.  Psychiatric/Behavioral: The patient is not nervous/anxious.     Allergies  Hornet venom and Aspirin  Home Medications   Current Outpatient Rx  Name  Route  Sig  Dispense  Refill  . acetaminophen (TYLENOL) 500 MG tablet   Oral   Take 500-1,000 mg by mouth every 6 (six) hours as needed. For pain         . ibuprofen (ADVIL,MOTRIN) 200 MG tablet   Oral   Take 400 mg by mouth every 6 (six) hours as needed. For pain         . naproxen (NAPROSYN) 500 MG tablet   Oral   Take 1 tablet (500 mg total) by mouth 2 (two) times daily with a meal.   20 tablet   0   . oxyCODONE-acetaminophen (PERCOCET/ROXICET) 5-325 MG per tablet   Oral   Take 1 tablet by mouth every 4 (four) hours as needed for pain.   10 tablet   0    BP 155/82  Pulse 91  Temp(Src) 98.5 F (36.9 C) (Oral)  Resp 20  Ht 6' (1.829 m)  Wt 285 lb (129.275 kg)  BMI 38.64 kg/m2  SpO2 97% Physical Exam  Nursing note and vitals reviewed. Constitutional: He is oriented to person, place, and time. He appears well-developed and well-nourished. No distress.  HENT:  Head: Atraumatic.  Eyes: Conjunctivae and EOM are normal.  Neck: Neck supple.  Cardiovascular: Normal rate.   Pulmonary/Chest: Effort normal.  Musculoskeletal:       Left knee: He exhibits decreased range of motion and swelling. He exhibits no ecchymosis, no erythema and no LCL laxity. Tenderness found. MCL tenderness noted.  Neurological: He is alert and oriented to person, place, and time. No cranial nerve deficit.  Skin: Skin is warm and dry.  Psychiatric: He has a normal mood and affect. His behavior is normal.    ED Course  Procedures (including critical care time) Labs Review Labs Reviewed - No data to display Imaging Review Dg Knee Complete 4 Views Left  08/21/2013    CLINICAL DATA:  Knee injury, pain, swelling medially.  EXAM: LEFT KNEE - COMPLETE 4+ VIEW  COMPARISON:  12/09/2012  FINDINGS: Advanced degenerative joint disease changes in the medial and patellofemoral compartments with joint space narrowing and spurring. No acute bony abnormality. Specifically, no fracture, subluxation, or dislocation. Soft tissues are intact. No joint effusion.  IMPRESSION: No acute bony abnormality.   Electronically Signed   By: Charlett Nose M.D.   On: 08/21/2013 15:00    MDM  42 y.o. male with injury to the left knee. Will place in knee immobilizer, he will use crutches for ambulation, apply ice, elevate and follow up with ortho. Will treat for  Pain and inflammation. Patient stable for discharge home without any immediate complications.  I have reviewed this patient's vital signs, nurses notes, appropriate labs and imaging.  I have discussed findings and plan of care with the patient.    Medication List    TAKE these medications       HYDROcodone-acetaminophen 5-325 MG per tablet  Commonly known as:  NORCO/VICODIN  Take 1 tablet by mouth every 4 (four) hours as needed.      ASK your doctor about these medications       acetaminophen 500 MG tablet  Commonly known as:  TYLENOL  Take 500-1,000 mg by mouth every 6 (six) hours as needed. For pain     ibuprofen 200 MG tablet  Commonly known as:  ADVIL,MOTRIN  Take 400 mg by mouth every 6 (six) hours as needed. For pain     naproxen 500 MG tablet  Commonly known as:  NAPROSYN  Take 1 tablet (500 mg total) by mouth 2 (two) times daily with a meal.     oxyCODONE-acetaminophen 5-325 MG per tablet  Commonly known as:  PERCOCET/ROXICET  Take 1 tablet by mouth every 4 (four) hours as needed for pain.           Janne Napoleon, [place] 08/21/13 [phone]

## 2013-08-21 NOTE — ED Notes (Signed)
Lt knee pain ,since fall last night on wet steps.

## 2013-08-23 NOTE — ED Provider Notes (Signed)
Medical screening examination/treatment/procedure(s) were performed by non-physician practitioner and as supervising physician I was immediately available for consultation/collaboration.   Esty Ahuja J Maher, MD 08/23/13 1420 

## 2013-09-25 ENCOUNTER — Emergency Department (HOSPITAL_COMMUNITY)
Admission: EM | Admit: 2013-09-25 | Discharge: 2013-09-25 | Disposition: A | Discharge status: Home / Self Care | Payer: Self-pay | Attending: Emergency Medicine | Admitting: Emergency Medicine

## 2013-09-25 ENCOUNTER — Emergency Department (HOSPITAL_COMMUNITY): Payer: Self-pay

## 2013-09-25 ENCOUNTER — Encounter (HOSPITAL_COMMUNITY): Payer: Self-pay | Admitting: Emergency Medicine

## 2013-09-25 DIAGNOSIS — M25562 Pain in left knee: Secondary | ICD-10-CM

## 2013-09-25 DIAGNOSIS — F17200 Nicotine dependence, unspecified, uncomplicated: Secondary | ICD-10-CM | POA: Insufficient documentation

## 2013-09-25 DIAGNOSIS — G8929 Other chronic pain: Secondary | ICD-10-CM | POA: Insufficient documentation

## 2013-09-25 DIAGNOSIS — M1710 Unilateral primary osteoarthritis, unspecified knee: Secondary | ICD-10-CM | POA: Insufficient documentation

## 2013-09-25 MED ORDER — OXYCODONE-ACETAMINOPHEN 5-325 MG PO TABS
1.0000 | ORAL_TABLET | Freq: Once | ORAL | Status: AC
  Administered 2013-09-25: 1 via ORAL
  Filled 2013-09-25: qty 1

## 2013-09-25 MED ORDER — DICLOFENAC SODIUM 75 MG PO TBEC: 75.0000 mg | DELAYED_RELEASE_TABLET | Freq: Two times a day (BID) | ORAL | Status: DC

## 2013-09-25 NOTE — ED Notes (Signed)
Pt was crawling on knees under a house today and now has swelling and pain to left knee, hx of knee surgery

## 2013-09-25 NOTE — ED Provider Notes (Signed)
CSN: [id number]     Arrival date & time 09/25/13  1354 History   First MD Initiated Contact with Patient 09/25/13 1407     Chief Complaint  Patient presents with  . Knee Pain   (Consider location/radiation/quality/duration/timing/severity/associated sxs/prior Treatment) Patient is a 42 y.o. male presenting with knee pain. The history is provided by the patient.  Knee Pain Location:  Knee Time since incident:  2 hours Injury: yes   Mechanism of injury comment:  Patient states he was down on his knees crawling underneath a house when his left knee began hurting Knee location:  L knee Pain details:    Quality:  Throbbing and aching   Radiates to:  Does not radiate   Severity:  Moderate   Onset quality:  Sudden   Timing:  Constant   Progression:  Unchanged Chronicity:  Recurrent Dislocation: no   Foreign body present:  No foreign bodies Prior injury to area:  Yes (hx of chronic left knee) Relieved by:  Rest Worsened by:  Activity and bearing weight Ineffective treatments:  None tried Associated symptoms: decreased ROM   Associated symptoms: no back pain, no fever, no itching, no muscle weakness, no neck pain, no numbness, no stiffness, no swelling and no tingling     Past Medical History  Diagnosis Date  . Knee arthropathy   . Chronic knee pain    Past Surgical History  Procedure Laterality Date  . Knee surgery    . Knee arthroscopy and arthrotomy     History reviewed. No pertinent family history. History  Substance Use Topics  . Smoking status: Current Every Day Smoker -- 1.00 packs/day    Types: Cigarettes  . Smokeless tobacco: Not on file  . Alcohol Use: No    Review of Systems  Constitutional: Negative for fever and chills.  Genitourinary: Negative for dysuria and difficulty urinating.  Musculoskeletal: Positive for arthralgias. Negative for back pain, joint swelling, neck pain and stiffness.  Skin: Negative for color change, itching and wound.  Neurological:  Negative for weakness and numbness.  All other systems reviewed and are negative.    Allergies  Hornet venom and Aspirin  Home Medications   Current Outpatient Rx  Name  Route  Sig  Dispense  Refill  . acetaminophen (TYLENOL) 500 MG tablet   Oral   Take 500-1,000 mg by mouth every 6 (six) hours as needed. For pain         . ibuprofen (ADVIL,MOTRIN) 200 MG tablet   Oral   Take 400 mg by mouth every 6 (six) hours as needed. For pain          BP 142/79  Pulse 110  Temp(Src) 97.9 F (36.6 C) (Oral)  Resp 20  Ht 6' (1.829 m)  Wt 285 lb (129.275 kg)  BMI 38.64 kg/m2  SpO2 96% Physical Exam  Nursing note and vitals reviewed. Constitutional: He is oriented to person, place, and time. He appears well-developed and well-nourished. No distress.  Cardiovascular: Normal rate, regular rhythm, normal heart sounds and intact distal pulses.   No murmur heard. Pulmonary/Chest: Effort normal and breath sounds normal. No respiratory distress.  Musculoskeletal: He exhibits tenderness. He exhibits no edema.  ttp of the medial left knee.  No erythema, effusion, or step-off deformity.  DP pulse brisk, distal sensation intact. Calf is soft and NT.  Neurological: He is alert and oriented to person, place, and time. He exhibits normal muscle tone. Coordination normal.  Skin: Skin is warm and dry.  No erythema.    ED Course  Procedures (including critical care time) Labs Review Labs Reviewed - No data to display Imaging Review No results found.  EKG Interpretation   None       MDM    Patient with history of chronic knee pain. Multiple ED visits for same. No concerning symptoms for septic joint. No hx of trauma to indicate need for imaging   Patient reviewed on the [facility] [place] [facility] narcotics database. No recent narcotics filled. Patient advised that he will need to followup with his orthopedic doctor.  Patient declines knee immobilizer.  Prescribed diclofenac for pain, VSS.  Pt  appears stable for discharge.  Ramaj Frangos L. Trisha Mangle, PA-C 09/26/13 2012

## 2013-09-27 NOTE — ED Provider Notes (Signed)
Medical screening examination/treatment/procedure(s) were performed by non-physician practitioner and as supervising physician I was immediately available for consultation/collaboration.  EKG Interpretation   None        Donnetta Hutching, MD 09/27/13 1316

## 2013-12-31 ENCOUNTER — Encounter (HOSPITAL_COMMUNITY): Payer: Self-pay | Admitting: Emergency Medicine

## 2013-12-31 ENCOUNTER — Emergency Department (HOSPITAL_COMMUNITY)
Admission: EM | Admit: 2013-12-31 | Discharge: 2013-12-31 | Disposition: A | Discharge status: Home / Self Care | Payer: Self-pay | Attending: Emergency Medicine | Admitting: Emergency Medicine

## 2013-12-31 DIAGNOSIS — F17200 Nicotine dependence, unspecified, uncomplicated: Secondary | ICD-10-CM | POA: Insufficient documentation

## 2013-12-31 DIAGNOSIS — H6120 Impacted cerumen, unspecified ear: Secondary | ICD-10-CM | POA: Insufficient documentation

## 2013-12-31 DIAGNOSIS — Z79899 Other long term (current) drug therapy: Secondary | ICD-10-CM | POA: Insufficient documentation

## 2013-12-31 DIAGNOSIS — M1710 Unilateral primary osteoarthritis, unspecified knee: Secondary | ICD-10-CM | POA: Insufficient documentation

## 2013-12-31 DIAGNOSIS — G8929 Other chronic pain: Secondary | ICD-10-CM | POA: Insufficient documentation

## 2013-12-31 DIAGNOSIS — IMO0002 Reserved for concepts with insufficient information to code with codable children: Secondary | ICD-10-CM

## 2013-12-31 MED ORDER — ANTIPYRINE-BENZOCAINE 5.4-1.4 % OT SOLN
3.0000 [drp] | Freq: Once | OTIC | Status: AC
  Administered 2013-12-31: 4 [drp] via OTIC
  Filled 2013-12-31: qty 10

## 2013-12-31 MED ORDER — AMOXICILLIN 500 MG PO CAPS: 500.0000 mg | ORAL_CAPSULE | Freq: Three times a day (TID) | ORAL | Status: DC

## 2013-12-31 MED ORDER — HYDROCODONE-ACETAMINOPHEN 5-325 MG PO TABS: ORAL_TABLET | ORAL | Status: DC

## 2013-12-31 MED ORDER — OXYCODONE-ACETAMINOPHEN 5-325 MG PO TABS
1.0000 | ORAL_TABLET | Freq: Once | ORAL | Status: AC
  Administered 2013-12-31: 1 via ORAL
  Filled 2013-12-31: qty 1

## 2013-12-31 NOTE — Discharge Instructions (Signed)
Cerumen Impaction A cerumen impaction is when the wax in your ear forms a plug. This plug usually causes reduced hearing. Sometimes it also causes an earache or dizziness. Removing a cerumen impaction can be difficult and painful. The wax sticks to the ear canal. The canal is sensitive and bleeds easily. If you try to remove a heavy wax buildup with a cotton tipped swab, you may push it in further. Irrigation with water, suction, and small ear curettes may be used to clear out the wax. If the impaction is fixed to the skin in the ear canal, ear drops may be needed for a few days to loosen the wax. People who build up a lot of wax frequently can use ear wax removal products available in your local drugstore. SEEK MEDICAL CARE IF:  You develop an earache, increased hearing loss, or marked dizziness. Document Released: 12/02/2004 Document Revised: 01/17/2012 Document Reviewed: 01/22/2010 [employer] Patient Information 2014 [employer].  

## 2013-12-31 NOTE — ED Provider Notes (Signed)
CSN: [id number]     Arrival date & time 12/31/13  1216 History   First MD Initiated Contact with Patient 12/31/13 1255 This chart was scribed for non-physician practitioner Maxwell Caulammy L Osa Fogarty, PA-C working with Donnetta HutchingBrian Cook, MD by Valera CastleSteven Perry, ED scribe. This patient was seen in room APFT23/APFT23 and the patient's care was started at 1:13 PM.     Chief Complaint  Patient presents with  . Otalgia   (Consider location/radiation/quality/duration/timing/severity/associated sxs/prior Treatment) Patient is a 43 y.o. male presenting with ear pain. The history is provided by the patient. No language interpreter was used.  Otalgia Location:  Left Behind ear:  No abnormality Quality:  Sharp Duration:  1 day Timing:  Constant Progression:  Worsening Chronicity:  New Associated symptoms: no abdominal pain, no congestion, no cough, no ear discharge, no fever, no neck pain, no rash, no sore throat and no vomiting    HPI Comments: Steven Barnes is a 43 y.o. male who presents to the Emergency Department complaining of constant, sharp, peircing, left ear pain, onset yesterday. He denies right ear pain. He reports trouble sleeping last night due to his pain. He denies coughing, sneezing, congestion, fever, sore throat, or dizziness and any other associated symptoms.   PCP - No PCP Per Patient  Past Medical History  Diagnosis Date  . Knee arthropathy   . Chronic knee pain    Past Surgical History  Procedure Laterality Date  . Knee surgery    . Knee arthroscopy and arthrotomy     No family history on file. History  Substance Use Topics  . Smoking status: Current Every Day Smoker -- 1.00 packs/day    Types: Cigarettes  . Smokeless tobacco: Not on file  . Alcohol Use: No    Review of Systems  Constitutional: Negative for fever, chills and fatigue.  HENT: Positive for ear pain (left). Negative for congestion, ear discharge, sneezing, sore throat and trouble swallowing.   Respiratory: Negative  for cough, shortness of breath and wheezing.   Cardiovascular: Negative for chest pain and palpitations.  Gastrointestinal: Negative for nausea, vomiting, abdominal pain and blood in stool.  Genitourinary: Negative for dysuria, hematuria and flank pain.  Musculoskeletal: Negative for arthralgias, back pain, myalgias, neck pain and neck stiffness.  Skin: Negative for rash.  Neurological: Negative for dizziness, weakness and numbness.  Hematological: Does not bruise/bleed easily.    Allergies  Hornet venom and Aspirin  Home Medications   Current Outpatient Rx  Name  Route  Sig  Dispense  Refill  . acetaminophen (TYLENOL) 500 MG tablet   Oral   Take 500-1,000 mg by mouth every 6 (six) hours as needed. For pain         . diclofenac (VOLTAREN) 75 MG EC tablet   Oral   Take 1 tablet (75 mg total) by mouth 2 (two) times daily. Take with food   14 tablet   0   . ibuprofen (ADVIL,MOTRIN) 200 MG tablet   Oral   Take 400 mg by mouth every 6 (six) hours as needed. For pain          BP 165/87  Pulse 112  Temp(Src) 98.5 F (36.9 C)  Resp 18  Ht 6' (1.829 m)  Wt 285 lb (129.275 kg)  BMI 38.64 kg/m2  SpO2 98%  Physical Exam  Nursing note and vitals reviewed. Constitutional: He is oriented to person, place, and time. He appears well-developed and well-nourished. No distress.  HENT:  Head: Normocephalic and atraumatic.  Right Ear: Tympanic membrane and ear canal normal.  Nose: Nose normal.  Mouth/Throat: Uvula is midline, oropharynx is clear and moist and mucous membranes are normal. No oropharyngeal exudate.  Left ear exam reveals Left cerumen impaction. Small opening in upper soft palette which is chronic.   Eyes: EOM are normal. No scleral icterus.  Neck: Normal range of motion. Neck supple.  Cardiovascular: Normal rate, regular rhythm and normal heart sounds.  Exam reveals no gallop and no friction rub.   No murmur heard. Pulmonary/Chest: Effort normal and breath sounds  normal. No respiratory distress. He has no wheezes. He has no rales. He exhibits no tenderness.  Musculoskeletal: Normal range of motion. He exhibits no tenderness.  Lymphadenopathy:    He has no cervical adenopathy.  Neurological: He is alert and oriented to person, place, and time. He exhibits normal muscle tone. Coordination normal.  Skin: Skin is warm and dry.    ED Course  Procedures (including critical care time)  DIAGNOSTIC STUDIES: Oxygen Saturation is 98% on room air, normal by my interpretation.    COORDINATION OF CARE: 1:15 PM-Discussed treatment plan which includes ear drops and irrigation with pt at bedside and pt agreed to plan.   Labs Review Labs Reviewed - No data to display Imaging Review No results found.  EKG Interpretation   None      Left ear irrigated with saline and H2O2 by nursing staff.    MDM   Final diagnoses:  Cerumen impaction    Large amt of wax irrigated from left ear canal.  On recheck, Moderate cerumen still present.  TM still not well visualized due to cerumen.  Auralgan otic drops dispensed for home use.  Pt agrees to apply drops TID and f/u with PMD for further irrigation if needed.    Pt is non-toxic appearing.  Ambulates with steady gait.    The patient appears reasonably screened and/or stabilized for discharge and I doubt any other medical condition or other [facility] requiring further screening, evaluation, or treatment in the ED at this time prior to discharge.   I personally performed the services described in this documentation, which was scribed in my presence. The recorded information has been reviewed and is accurate.    Ivonne Freeburg L. Trisha Mangle, PA-C 01/02/14 2300

## 2013-12-31 NOTE — ED Notes (Signed)
Left ear pain since yesterday

## 2013-12-31 NOTE — ED Notes (Signed)
After several attempts to remove ear wax, large clump of wax removed from left ear, pt began crying, slight amount of bleeding from left ear.  PA notified and at bedside.

## 2014-01-03 NOTE — ED Provider Notes (Signed)
Medical screening examination/treatment/procedure(s) were performed by non-physician practitioner and as supervising physician I was immediately available for consultation/collaboration.  EKG Interpretation   None        Casi Westerfeld, MD 01/03/14 2040 

## 2014-02-20 ENCOUNTER — Encounter (HOSPITAL_COMMUNITY): Payer: Self-pay | Admitting: Emergency Medicine

## 2014-02-20 DIAGNOSIS — Y9389 Activity, other specified: Secondary | ICD-10-CM | POA: Insufficient documentation

## 2014-02-20 DIAGNOSIS — Z792 Long term (current) use of antibiotics: Secondary | ICD-10-CM | POA: Insufficient documentation

## 2014-02-20 DIAGNOSIS — X500XXA Overexertion from strenuous movement or load, initial encounter: Secondary | ICD-10-CM | POA: Insufficient documentation

## 2014-02-20 DIAGNOSIS — Z791 Long term (current) use of non-steroidal anti-inflammatories (NSAID): Secondary | ICD-10-CM | POA: Insufficient documentation

## 2014-02-20 DIAGNOSIS — S335XXA Sprain of ligaments of lumbar spine, initial encounter: Secondary | ICD-10-CM | POA: Insufficient documentation

## 2014-02-20 DIAGNOSIS — Y929 Unspecified place or not applicable: Secondary | ICD-10-CM | POA: Insufficient documentation

## 2014-02-20 DIAGNOSIS — F17200 Nicotine dependence, unspecified, uncomplicated: Secondary | ICD-10-CM | POA: Insufficient documentation

## 2014-02-20 NOTE — ED Notes (Signed)
Patient c/o lower back pain that radiates down left leg since yesterday after climbing a ladder.

## 2014-02-21 ENCOUNTER — Emergency Department (HOSPITAL_COMMUNITY)
Admission: EM | Admit: 2014-02-21 | Discharge: 2014-02-21 | Disposition: A | Discharge status: Home / Self Care | Payer: Self-pay | Attending: Emergency Medicine | Admitting: Emergency Medicine

## 2014-02-21 DIAGNOSIS — S39012A Strain of muscle, fascia and tendon of lower back, initial encounter: Secondary | ICD-10-CM

## 2014-02-21 MED ORDER — KETOROLAC TROMETHAMINE 60 MG/2ML IM SOLN
60.0000 mg | Freq: Once | INTRAMUSCULAR | Status: AC
  Administered 2014-02-21: 60 mg via INTRAMUSCULAR
  Filled 2014-02-21: qty 2

## 2014-02-21 MED ORDER — HYDROCODONE-ACETAMINOPHEN 5-325 MG PO TABS: 2.0000 | ORAL_TABLET | ORAL | Status: DC | PRN

## 2014-02-21 MED ORDER — METHOCARBAMOL 500 MG PO TABS: 500.0000 mg | ORAL_TABLET | Freq: Two times a day (BID) | ORAL | Status: DC | PRN

## 2014-02-21 MED ORDER — NAPROXEN 500 MG PO TABS: 500.0000 mg | ORAL_TABLET | Freq: Two times a day (BID) | ORAL | Status: DC

## 2014-02-21 MED ORDER — DEXAMETHASONE SODIUM PHOSPHATE 10 MG/ML IJ SOLN
10.0000 mg | Freq: Once | INTRAMUSCULAR | Status: AC
  Administered 2014-02-21: 10 mg via INTRAMUSCULAR
  Filled 2014-02-21: qty 1

## 2014-02-21 NOTE — Discharge Instructions (Signed)
Back Pain: ° ° °Your back pain should be treated with medicines such as ibuprofen or aleve and this back pain should get better over the next 2 weeks.  However if you develop severe or worsening pain, low back pain with fever, numbness, weakness or inability to walk or urinate, you should return to the ER immediately.  Please follow up with your doctor this week for a recheck if still having symptoms. °Low back pain is discomfort in the lower back that may be due to injuries to muscles and ligaments around the spine.  Occasionally, it may be caused by a a problem to a part of the spine called a disc.  The pain may last several days or a week;  However, most patients get completely well in 4 weeks. ° °Self - care:  The application of heat can help soothe the pain.  Maintaining your daily activities, including walking, is encourged, as it will help you get better faster than just staying in bed. ° °Medications are also useful to help with pain control.  A commonly prescribed medications includes acetaminophen.  This medication is generally safe, though you should not take more than 8 of the extra strength (500mg) pills a day. ° °Non steroidal anti inflammatory medications including Ibuprofen and naproxen;  These medications help both pain and swelling and are very useful in treating back pain.  They should be taken with food, as they can cause stomach upset, and more seriously, stomach bleeding.   ° °Muscle relaxants:  These medications can help with muscle tightness that is a cause of lower back pain.  Most of these medications can cause drowsiness, and it is not safe to drive or use dangerous machinery while taking them. ° °You will need to follow up with  Your primary healthcare provider in 1-2 weeks for reassessment. ° °Be aware that if you develop new symptoms, such as a fever, leg weakness, difficulty with or loss of control of your urine or bowels, abdominal pain, or more severe pain, you will need to seek  medical attention and  / or return to the Emergency department. ° °If you do not have a doctor see the list below. ° °[facility] Doctor List ° ° ° °Edward Hawkins MD. Specialty: Pulmonary Disease Contact information: [address]  °[address]  °[place] [address]  °[phone]  ° °Margaret Simpson, MD. Specialty: Family Medicine Contact information: [address]  °[place] [address]  °[phone]  ° °Scott Luking, MD. Specialty: Family Medicine Contact information: [address]  °[address]  °[place] [address]  °[phone]  ° °Tesfaye Fanta, MD Specialty: Internal Medicine Contact information: [address]  °[place] [address]  °[phone]  ° °Zach Hall, MD. Specialty: Internal Medicine Contact information: [address]  °[place] [address]  °[phone]  ° °Angus Mcinnis, MD. Specialty: Family Medicine Contact information: [address]  °[place] [address]  °[phone]  ° °Stephen Knowlton, MD. Specialty: Family Medicine Contact information: [address]  °[address]  °[place] [address]  °[phone]  ° °Roy Fagan, MD. Specialty: Internal Medicine Contact information: [address]  °[address]  °[place] [address]  °[phone]  ° ° ° °

## 2014-02-21 NOTE — ED Provider Notes (Signed)
CSN: [id number]     Arrival date & time 02/20/14  2300 History   First MD Initiated Contact with Patient 02/21/14 0200     Chief Complaint  Patient presents with  . Back Pain     (Consider location/radiation/quality/duration/timing/severity/associated sxs/prior Treatment) HPI Comments: 43-year-old male who presents with acute onset of left lower back pain that started yesterday when he was on a ladder and had to reach for something acutely. He felt acute onset of pain in his left lower back which has been persistent since that time. It is worse with ambulation, worse with rotation at the hips, he does have some persistence of discomfort. The pain is worse with dorsiflexion the left foot and keeping his left leg straight. He denies any pathologic signs of back pain including fever, IV drug use, cancer, urinary incontinence or retention.  Patient is a 43 y.o. male presenting with back pain. The history is provided by the patient.  Back Pain Associated symptoms: no fever, no numbness and no weakness     Past Medical History  Diagnosis Date  . Knee arthropathy   . Chronic knee pain    Past Surgical History  Procedure Laterality Date  . Knee surgery    . Knee arthroscopy and arthrotomy     No family history on file. History  Substance Use Topics  . Smoking status: Current Every Day Smoker -- 1.00 packs/day    Types: Cigarettes  . Smokeless tobacco: Not on file  . Alcohol Use: No    Review of Systems  Constitutional: Negative for fever and chills.  Cardiovascular: Negative for leg swelling.  Gastrointestinal: Negative for nausea and vomiting.       No incontinence of bowel  Genitourinary: Negative for difficulty urinating.       No incontinence or retention  Musculoskeletal: Positive for back pain. Negative for neck pain.  Skin: Negative for rash.  Neurological: Negative for weakness and numbness.      Allergies  Hornet venom and Aspirin  Home Medications   Prior to  Admission medications   Medication Sig Start Date End Date Taking? Authorizing Provider  acetaminophen (TYLENOL) 500 MG tablet Take 500-1,000 mg by mouth every 6 (six) hours as needed. For pain   Yes Historical Provider, MD  diclofenac (VOLTAREN) 75 MG EC tablet Take 1 tablet (75 mg total) by mouth 2 (two) times daily. Take with food 09/25/13  Yes Tammy L. Triplett, PA-C  HYDROcodone-acetaminophen (NORCO/VICODIN) 5-325 MG per tablet Take one-two tabs po q 4-6 hrs prn pain 12/31/13  Yes Tammy L. Triplett, PA-C  ibuprofen (ADVIL,MOTRIN) 200 MG tablet Take 400 mg by mouth every 6 (six) hours as needed. For pain   Yes Historical Provider, MD  amoxicillin (AMOXIL) 500 MG capsule Take 1 capsule (500 mg total) by mouth 3 (three) times daily. 12/31/13   Tammy L. Triplett, PA-C  HYDROcodone-acetaminophen (NORCO/VICODIN) 5-325 MG per tablet Take 2 tablets by mouth every 4 (four) hours as needed. 02/21/14   Vida RollerBrian D Torie Towle, MD  methocarbamol (ROBAXIN) 500 MG tablet Take 1 tablet (500 mg total) by mouth 2 (two) times daily as needed for muscle spasms. 02/21/14   Vida RollerBrian D Deionna Marcantonio, MD  naproxen (NAPROSYN) 500 MG tablet Take 1 tablet (500 mg total) by mouth 2 (two) times daily with a meal. 02/21/14   Vida RollerBrian D Daishawn Lauf, MD   BP 150/78  Pulse 94  Temp(Src) 98.5 F (36.9 C) (Oral)  Resp 18  Ht 6' (1.829 m)  Wt 285  lb (129.275 kg)  BMI 38.64 kg/m2  SpO2 98% Physical Exam  Nursing note and vitals reviewed. Constitutional: He appears well-developed and well-nourished.  HENT:  Head: Normocephalic and atraumatic.  Eyes: Conjunctivae are normal. No scleral icterus.  Cardiovascular: Normal rate, regular rhythm and intact distal pulses.   Pulmonary/Chest: Effort normal and breath sounds normal.  Abdominal: Soft.  No pulsating masses, no guarding, no tenderness  Musculoskeletal: He exhibits tenderness ( Local tenderness to the left lumbar paraspinal muscles).  No spinal tenderness of the cervical, thoracic or lumbar  spines  Pain in the left lower back and leg worsened with passive extension and dorsiflexion of the foot, relieved with flexion  Neurological: He is alert.  Gait is antalgic secondary to low back pain, isolated strength of the bilateral lower extremities is normal, sensation normal, speech normal  Skin: Skin is warm and dry. No erythema.    ED Course  Procedures (including critical care time) Labs Review Labs Reviewed - No data to display  Imaging Review No results found.    MDM   Final diagnoses:  Lumbar strain    No focal neurologic deficits, vital signs within normal limits given the patient's acute pain presentation, no red flags for pathologic bacteria in, will treat as lumbar strain, possible mild radiculopathy but no weakness or numbness.  Patient in agreement with the plan, will follow up with family doctor   Meds given in ED:  Medications  ketorolac (TORADOL) injection 60 mg (not administered)  dexamethasone (DECADRON) injection 10 mg (not administered)    New Prescriptions   HYDROCODONE-ACETAMINOPHEN (NORCO/VICODIN) 5-325 MG PER TABLET    Take 2 tablets by mouth every 4 (four) hours as needed.   METHOCARBAMOL (ROBAXIN) 500 MG TABLET    Take 1 tablet (500 mg total) by mouth 2 (two) times daily as needed for muscle spasms.   NAPROXEN (NAPROSYN) 500 MG TABLET    Take 1 tablet (500 mg total) by mouth 2 (two) times daily with a meal.        Vida RollerBrian D Hibo Blasdell, MD 02/21/14 [id number]

## 2014-04-07 ENCOUNTER — Emergency Department (HOSPITAL_COMMUNITY)
Admission: EM | Admit: 2014-04-07 | Discharge: 2014-04-07 | Disposition: A | Discharge status: Home / Self Care | Payer: Self-pay | Attending: Emergency Medicine | Admitting: Emergency Medicine

## 2014-04-07 ENCOUNTER — Encounter (HOSPITAL_COMMUNITY): Payer: Self-pay | Admitting: Emergency Medicine

## 2014-04-07 ENCOUNTER — Emergency Department (HOSPITAL_COMMUNITY): Payer: Self-pay

## 2014-04-07 DIAGNOSIS — M545 Low back pain, unspecified: Secondary | ICD-10-CM | POA: Insufficient documentation

## 2014-04-07 DIAGNOSIS — Z792 Long term (current) use of antibiotics: Secondary | ICD-10-CM | POA: Insufficient documentation

## 2014-04-07 DIAGNOSIS — Z791 Long term (current) use of non-steroidal anti-inflammatories (NSAID): Secondary | ICD-10-CM | POA: Insufficient documentation

## 2014-04-07 DIAGNOSIS — Z79899 Other long term (current) drug therapy: Secondary | ICD-10-CM | POA: Insufficient documentation

## 2014-04-07 DIAGNOSIS — M25569 Pain in unspecified knee: Secondary | ICD-10-CM | POA: Insufficient documentation

## 2014-04-07 DIAGNOSIS — F17200 Nicotine dependence, unspecified, uncomplicated: Secondary | ICD-10-CM | POA: Insufficient documentation

## 2014-04-07 DIAGNOSIS — R52 Pain, unspecified: Secondary | ICD-10-CM | POA: Insufficient documentation

## 2014-04-07 DIAGNOSIS — IMO0002 Reserved for concepts with insufficient information to code with codable children: Secondary | ICD-10-CM | POA: Insufficient documentation

## 2014-04-07 DIAGNOSIS — G8929 Other chronic pain: Secondary | ICD-10-CM | POA: Insufficient documentation

## 2014-04-07 DIAGNOSIS — Z9889 Other specified postprocedural states: Secondary | ICD-10-CM | POA: Insufficient documentation

## 2014-04-07 DIAGNOSIS — M1710 Unilateral primary osteoarthritis, unspecified knee: Secondary | ICD-10-CM | POA: Insufficient documentation

## 2014-04-07 DIAGNOSIS — M25562 Pain in left knee: Secondary | ICD-10-CM

## 2014-04-07 DIAGNOSIS — M1712 Unilateral primary osteoarthritis, left knee: Secondary | ICD-10-CM

## 2014-04-07 MED ORDER — HYDROCODONE-ACETAMINOPHEN 5-325 MG PO TABS: 1.0000 | ORAL_TABLET | ORAL | Status: DC | PRN

## 2014-04-07 MED ORDER — HYDROCODONE-ACETAMINOPHEN 5-325 MG PO TABS
2.0000 | ORAL_TABLET | Freq: Once | ORAL | Status: AC
  Administered 2014-04-07: 2 via ORAL
  Filled 2014-04-07: qty 2

## 2014-04-07 MED ORDER — IBUPROFEN 400 MG PO TABS
600.0000 mg | ORAL_TABLET | Freq: Once | ORAL | Status: AC
  Administered 2014-04-07: 600 mg via ORAL
  Filled 2014-04-07: qty 2

## 2014-04-07 MED ORDER — NAPROXEN 375 MG PO TABS: 375.0000 mg | ORAL_TABLET | Freq: Two times a day (BID) | ORAL | Status: DC

## 2014-04-07 NOTE — ED Provider Notes (Signed)
CSN: [id number]     Arrival date & time 04/07/14  0146 History   First MD Initiated Contact with Patient 04/07/14 [id number]     Chief Complaint  Patient presents with  . 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 Pain is worse in the past 24 hours and patient has difficulty extending his left knee and pain with palpation and squatting. Patient has lower back pain with extending his legs, similar to previous but more severe. Patient denies weakness, numbness, bowel or bladder changes, fever.  Patient has been lifting heavy and that worsens his back pain. No back surgery history. Pain with palpation and bending forward.  Patient is a 43 y.o. male presenting with musculoskeletal pain. The history is provided by the patient.  Muscle Pain Pertinent negatives include no abdominal pain.    Past Medical History  Diagnosis Date  . Knee arthropathy   . Chronic knee pain    Past Surgical History  Procedure Laterality Date  . Knee surgery    . Knee arthroscopy and arthrotomy     No family history on file. History  Substance Use Topics  . Smoking status: Current Every Day Smoker -- 1.00 packs/day    Types: Cigarettes  . Smokeless tobacco: Not on file  . Alcohol Use: No    Review of Systems  Constitutional: Negative for fever.  HENT: Negative for congestion.   Gastrointestinal: Negative for vomiting and abdominal pain.  Genitourinary: Negative for difficulty urinating.  Musculoskeletal: Positive for arthralgias and back pain. Negative for joint swelling, neck pain and neck stiffness.  Skin: Negative for rash.  Neurological: Negative for weakness, light-headedness and numbness.      Allergies  Hornet venom and Aspirin  Home Medications   Prior to  Admission medications   Medication Sig Start Date End Date Taking? Authorizing Provider  acetaminophen (TYLENOL) 500 MG tablet Take 500-1,000 mg by mouth every 6 (six) hours as needed. For pain    Historical Provider, MD  amoxicillin (AMOXIL) 500 MG capsule Take 1 capsule (500 mg total) by mouth 3 (three) times daily. 12/31/13   Tammy L. Triplett, PA-C  diclofenac (VOLTAREN) 75 MG EC tablet Take 1 tablet (75 mg total) by mouth 2 (two) times daily. Take with food 09/25/13   Tammy L. Triplett, PA-C  HYDROcodone-acetaminophen (NORCO/VICODIN) 5-325 MG per tablet Take one-two tabs po q 4-6 hrs prn pain 12/31/13   Tammy L. Triplett, PA-C  HYDROcodone-acetaminophen (NORCO/VICODIN) 5-325 MG per tablet Take 2 tablets by mouth every 4 (four) hours as needed. 02/21/14   Vida RollerBrian D Miller, MD  ibuprofen (ADVIL,MOTRIN) 200 MG tablet Take 400 mg by mouth every 6 (six) hours as needed. For pain    Historical Provider, MD  methocarbamol (ROBAXIN) 500 MG tablet Take 1 tablet (500 mg total) by mouth 2 (two) times daily as needed for muscle spasms. 02/21/14   Vida RollerBrian D Miller, MD  naproxen (NAPROSYN) 500 MG tablet Take 1 tablet (500 mg total) by mouth 2 (two) times daily with a meal. 02/21/14   Vida RollerBrian D Miller, MD   There were no vitals taken for this visit. Physical Exam  Nursing note and vitals reviewed. Constitutional: He is oriented to person, place, and time. He appears well-developed and well-nourished.  HENT:  Head: Normocephalic and atraumatic.  Eyes: Right eye exhibits no discharge. Left  eye exhibits no discharge.  Neck: Normal range of motion. Neck supple. No tracheal deviation present.  Cardiovascular: Normal rate.   Pulmonary/Chest: Effort normal.  Musculoskeletal: He exhibits tenderness. He exhibits no edema.  Patient has mild left-sided paralumbar spinal tenderness with mild muscle tension extending into the upper buttocks. Mild swelling medial/ prox left knee, no induration or signs of acute  infection Patient has mild tenderness lower medial aspect of left knee without effusion or signs of infection. Decreased ability to extend and full flexion with mild pain.  Neurological: He is alert and oriented to person, place, and time.  Patient has normal strength and sensation 5+ strength to palpation lower exam is bilateral with normal motion and reflexes.  Skin: Skin is warm. No rash noted.  Psychiatric: He has a normal mood and affect.    ED Course  Procedures (including critical care time) Labs Review Labs Reviewed - No data to display  Imaging Review Dg Knee Complete 4 Views Left  04/07/2014   CLINICAL DATA:  Knee pain.  Squatting injury.  EXAM: LEFT KNEE - COMPLETE 4+ VIEW  COMPARISON:  09/25/2013  FINDINGS: No fracture, dislocation, or new joint fluid.  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 There is a long-standing (since 2010 at least) soft tissue mass medial to the knee which contains gas, likely arising from intra-articular vacuum phenomenon.  IMPRESSION: 1. No acute osseous findings. 2. 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   Electronically Signed   By: Tiburcio Pea M.D.   On: 04/07/2014 03:57     EKG Interpretation None      MDM   Final diagnoses:  Arthritis of left knee  Acute pain of left knee   Acute on chronic injuries/ arthritis Plan for x-rays patient cannot fully extend his left knee. Pain medicines in ER. Discussed close followup with his orthopedic doctor. Xray reviewed, severe arthritis. No clinical signs of infection.  Results and differential diagnosis were discussed with the patient/parent/guardian. Close follow up outpatient was discussed, comfortable with the plan.      Enid Skeens, MD 04/07/14 [phone]

## 2014-04-07 NOTE — Discharge Instructions (Signed)
See your ortho doctor this week.  If you were given medicines take as directed.  If you are on coumadin or contraceptives realize their levels and effectiveness is altered by many different medicines.  If you have any reaction (rash, tongues swelling, other) to the medicines stop taking and see a physician.   Please follow up as directed and return to the ER or see a physician for new or worsening symptoms.  Thank you. There were no vitals filed for this visit.

## 2014-04-07 NOTE — ED Notes (Signed)
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 No relief with ibuprofen and tylenol

## 2014-06-08 ENCOUNTER — Emergency Department (HOSPITAL_COMMUNITY)
Admission: EM | Admit: 2014-06-08 | Discharge: 2014-06-08 | Disposition: A | Discharge status: Home / Self Care | Payer: Self-pay | Attending: Emergency Medicine | Admitting: Emergency Medicine

## 2014-06-08 ENCOUNTER — Encounter (HOSPITAL_COMMUNITY): Payer: Self-pay | Admitting: Emergency Medicine

## 2014-06-08 DIAGNOSIS — L0211 Cutaneous abscess of neck: Secondary | ICD-10-CM | POA: Insufficient documentation

## 2014-06-08 DIAGNOSIS — Z9889 Other specified postprocedural states: Secondary | ICD-10-CM | POA: Insufficient documentation

## 2014-06-08 DIAGNOSIS — L0291 Cutaneous abscess, unspecified: Secondary | ICD-10-CM

## 2014-06-08 DIAGNOSIS — Z79899 Other long term (current) drug therapy: Secondary | ICD-10-CM | POA: Insufficient documentation

## 2014-06-08 DIAGNOSIS — G8929 Other chronic pain: Secondary | ICD-10-CM | POA: Insufficient documentation

## 2014-06-08 DIAGNOSIS — L03221 Cellulitis of neck: Principal | ICD-10-CM

## 2014-06-08 DIAGNOSIS — F17200 Nicotine dependence, unspecified, uncomplicated: Secondary | ICD-10-CM | POA: Insufficient documentation

## 2014-06-08 DIAGNOSIS — Z791 Long term (current) use of non-steroidal anti-inflammatories (NSAID): Secondary | ICD-10-CM | POA: Insufficient documentation

## 2014-06-08 MED ORDER — OXYCODONE-ACETAMINOPHEN 5-325 MG PO TABS
1.0000 | ORAL_TABLET | Freq: Once | ORAL | Status: AC
  Administered 2014-06-08: 1 via ORAL
  Filled 2014-06-08: qty 1

## 2014-06-08 MED ORDER — SULFAMETHOXAZOLE-TMP DS 800-160 MG PO TABS
1.0000 | ORAL_TABLET | Freq: Once | ORAL | Status: AC
  Administered 2014-06-08: 1 via ORAL
  Filled 2014-06-08: qty 1

## 2014-06-08 MED ORDER — OXYCODONE-ACETAMINOPHEN 5-325 MG PO TABS: 1.0000 | ORAL_TABLET | ORAL | Status: DC | PRN

## 2014-06-08 MED ORDER — SULFAMETHOXAZOLE-TRIMETHOPRIM 800-160 MG PO TABS: 1.0000 | ORAL_TABLET | Freq: Two times a day (BID) | ORAL | Status: DC

## 2014-06-08 NOTE — ED Notes (Signed)
Abscess to back of neck x 4 days.

## 2014-06-08 NOTE — ED Notes (Signed)
Pt verbalized understanding to use caution and no driving within 4 hours of taking pain med due to med causes drowsiness, also made aware of med causes constipation as well

## 2014-06-08 NOTE — ED Provider Notes (Signed)
CSN: [id number]     Arrival date & time 06/08/14  1456 History   First MD Initiated Contact with Patient 06/08/14 1521     Chief Complaint  Patient presents with  . Abscess   Steven Barnes is a 43 y.o. male who presents to the Emergency Department complaining of pain, redness and swelling to the right posterior neck.  He states the area began as a small "pimple" and he has squeezed, but he reports continued pain and now states the area has become larger in size.  He also c/o pain with movement of his neck.  He denies drainage, fever, chills, nausea or difficulty swallowing.  He applied a warm towel to the area on the evening prior to ed arrival with temporary relief.    (Consider location/radiation/quality/duration/timing/severity/associated sxs/prior Treatment) The history is provided by the patient.    Past Medical History  Diagnosis Date  . Knee arthropathy   . Chronic knee pain    Past Surgical History  Procedure Laterality Date  . Knee surgery    . Knee arthroscopy and arthrotomy     No family history on file. History  Substance Use Topics  . Smoking status: Current Every Day Smoker -- 1.00 packs/day    Types: Cigarettes  . Smokeless tobacco: Not on file  . Alcohol Use: No    Review of Systems  Constitutional: Negative for fever and chills.  Gastrointestinal: Negative for nausea and vomiting.  Musculoskeletal: Negative for arthralgias and joint swelling.  Skin: Positive for color change.       Abscess   Hematological: Negative for adenopathy.  All other systems reviewed and are negative.     Allergies  Steven Barnes and Steven Barnes  Home Medications   Prior to Admission medications   Medication Sig Start Date End Date Taking? Authorizing Provider  acetaminophen (TYLENOL) 500 MG tablet Take 500-1,000 mg by mouth every 6 (six) hours as needed. For pain   Yes Historical Provider, MD  ibuprofen (ADVIL,MOTRIN) 200 MG tablet Take 400 mg by mouth every 6 (six) hours as  needed. For pain   Yes Historical Provider, MD   BP 153/83  Pulse 77  Temp(Src) 98.3 F (36.8 C) (Oral)  Resp 18  Ht 6' (1.829 m)  Wt 285 lb (129.275 kg)  BMI 38.64 kg/m2  SpO2 100% Physical Exam  Nursing note and vitals reviewed. Constitutional: He is oriented to person, place, and time. He appears well-developed and well-nourished. No distress.  HENT:  Head: Normocephalic and atraumatic.  Mouth/Throat: Oropharynx is clear and moist.  Neck: Normal range of motion. Neck supple.  Cardiovascular: Normal rate, regular rhythm and normal heart sounds.   No murmur heard. Pulmonary/Chest: Effort normal and breath sounds normal. No respiratory distress.  Musculoskeletal: Normal range of motion.  Lymphadenopathy:    He has no cervical adenopathy.  Neurological: He is alert and oriented to person, place, and time. He exhibits normal muscle tone. Coordination normal.  Skin: Skin is warm and dry. There is erythema.  4 cm area of induration and erythema to right posterior neck near the hairline.  No fluctuance or drainage.      ED Course  Procedures (including critical care time) Labs Review Labs Reviewed - No data to display  Imaging Review No results found.   EKG Interpretation None      MDM   Final diagnoses:  Abscess   Pt is well appearing.  VSS.  NV intact.  Likely early abscess to right neck.  Pt refuses I&D at this time.  Prefers to try antibiotic and warm compresses. Agrees to return here in 2 days for recheck and I&D if not improving .  Appears stable for d/c    Steven Reust L. Trisha Mangleriplett, PA-C 06/09/14 2150

## 2014-06-08 NOTE — Discharge Instructions (Signed)
Abscess °An abscess (boil or furuncle) is an infected area on or under the skin. This area is filled with yellowish-white fluid (pus) and other material (debris). °HOME CARE  °· Only take medicines as told by your doctor. °· If you were given antibiotic medicine, take it as directed. Finish the medicine even if you start to feel better. °· If gauze is used, follow your doctor's directions for changing the gauze. °· To avoid spreading the infection: °¨ Keep your abscess covered with a bandage. °¨ Wash your hands well. °¨ Do not share personal care items, towels, or whirlpools with others. °¨ Avoid skin contact with others. °· Keep your skin and clothes clean around the abscess. °· Keep all doctor visits as told. °GET HELP RIGHT AWAY IF:  °· You have more pain, puffiness (swelling), or redness in the wound site. °· You have more fluid or blood coming from the wound site. °· You have muscle aches, chills, or you feel sick. °· You have a fever. °MAKE SURE YOU:  °· Understand these instructions. °· Will watch your condition. °· Will get help right away if you are not doing well or get worse. °Document Released: 04/12/2008 Document Revised: 04/25/2012 Document Reviewed: 01/07/2012 °ExitCare® Patient Information ©2015 [employer]. This information is not intended to replace advice given to you by your health care provider. Make sure you discuss any questions you have with your health care provider. ° °

## 2014-06-10 ENCOUNTER — Emergency Department (HOSPITAL_COMMUNITY)
Admission: EM | Admit: 2014-06-10 | Discharge: 2014-06-10 | Disposition: A | Discharge status: Home / Self Care | Payer: Self-pay | Attending: Emergency Medicine | Admitting: Emergency Medicine

## 2014-06-10 ENCOUNTER — Encounter (HOSPITAL_COMMUNITY): Payer: Self-pay | Admitting: Emergency Medicine

## 2014-06-10 DIAGNOSIS — L0211 Cutaneous abscess of neck: Secondary | ICD-10-CM | POA: Insufficient documentation

## 2014-06-10 DIAGNOSIS — L03221 Cellulitis of neck: Principal | ICD-10-CM

## 2014-06-10 DIAGNOSIS — Z79899 Other long term (current) drug therapy: Secondary | ICD-10-CM | POA: Insufficient documentation

## 2014-06-10 DIAGNOSIS — Z792 Long term (current) use of antibiotics: Secondary | ICD-10-CM | POA: Insufficient documentation

## 2014-06-10 DIAGNOSIS — G8929 Other chronic pain: Secondary | ICD-10-CM | POA: Insufficient documentation

## 2014-06-10 DIAGNOSIS — L0291 Cutaneous abscess, unspecified: Secondary | ICD-10-CM

## 2014-06-10 DIAGNOSIS — F17200 Nicotine dependence, unspecified, uncomplicated: Secondary | ICD-10-CM | POA: Insufficient documentation

## 2014-06-10 MED ORDER — OXYCODONE-ACETAMINOPHEN 5-325 MG PO TABS
2.0000 | ORAL_TABLET | Freq: Once | ORAL | Status: AC
  Administered 2014-06-10: 2 via ORAL
  Filled 2014-06-10: qty 2

## 2014-06-10 MED ORDER — OXYCODONE-ACETAMINOPHEN 5-325 MG PO TABS: 1.0000 | ORAL_TABLET | ORAL | Status: DC | PRN

## 2014-06-10 MED ORDER — LIDOCAINE HCL (PF) 1 % IJ SOLN
5.0000 mL | Freq: Once | INTRAMUSCULAR | Status: AC
  Administered 2014-06-10: 5 mL
  Filled 2014-06-10: qty 5

## 2014-06-10 NOTE — ED Notes (Signed)
Abscess to back of neck Seen here 8/1 for same

## 2014-06-10 NOTE — ED Provider Notes (Signed)
History/physical exam/procedure(s) were performed by non-physician practitioner and as supervising physician I was immediately available for consultation/collaboration. I have reviewed all notes and am in agreement with care and plan.   Dena Esperanza S Armel Rabbani, MD 06/10/14 0002 

## 2014-06-10 NOTE — Discharge Instructions (Signed)
Abscess Care After An abscess (also called a boil or furuncle) is an infected area that contains a collection of pus. Signs and symptoms of an abscess include pain, tenderness, redness, or hardness, or you may feel a moveable soft area under your skin. An abscess can occur anywhere in the body. The infection may spread to surrounding tissues causing cellulitis. A cut (incision) by the surgeon was made over your abscess and the pus was drained out. Gauze may have been packed into the space to provide a drain that will allow the cavity to heal from the inside outwards. The boil may be painful for 5 to 7 days. Most people with a boil do not have high fevers. Your abscess, if seen early, may not have localized, and may not have been lanced. If not, another appointment may be required for this if it does not get better on its own or with medications. HOME CARE INSTRUCTIONS   Only take over-the-counter or prescription medicines for pain, discomfort, or fever as directed by your caregiver.  When you bathe, soak and then remove gauze or iodoform packs at least daily or as directed by your caregiver. You may then wash the wound gently with mild soapy water. Repack with gauze or do as your caregiver directs. SEEK IMMEDIATE MEDICAL CARE IF:   You develop increased pain, swelling, redness, drainage, or bleeding in the wound site.  You develop signs of generalized infection including muscle aches, chills, fever, or a general ill feeling.  An oral temperature above 102 F (38.9 C) develops, not controlled by medication. See your caregiver for a recheck if you develop any of the symptoms described above. If medications (antibiotics) were prescribed, take them as directed. Document Released: 05/13/2005 Document Revised: 01/17/2012 Document Reviewed: 01/08/2008 [facility] Patient Information 2015 [employer] [place]. This information is not intended to replace advice given to you by your health care provider. Make sure  you discuss any questions you have with your health care provider.   Take the packing out tomorrow morning as discussed and start doing warm soaks 15 minutes 4 times daily (either hot towels or shower as discussed).  Finish your entire course of antibiotics.  Return here for any worsening symptoms.

## 2014-06-10 NOTE — ED Notes (Signed)
Patient c/o abscess to back of neck. Per patient was seen here on 06/08/14 and given percocet and Septra DS. Patient instructed to return if it got worse. Patient reports increase in size and pain. Denies any drainage.

## 2014-06-10 NOTE — ED Provider Notes (Signed)
CSN: [id number]     Arrival date & time 06/10/14  1829 History  This chart was scribed for non-physician practitioner, Burgess AmorJulie Mane Consolo, PA-C,working with Rolland PorterMark Maxwell, MD, by Karle PlumberJennifer Tensley, ED Scribe. This patient was seen in room APFT20/[facility] and the patient's care was started at 7:33 PM.  Chief Complaint  Patient presents with  . Abscess   Patient is a 43 y.o. male presenting with abscess. The history is provided by the patient. No language interpreter was used.  Abscess Associated symptoms: no fever, no headaches, no nausea and no vomiting    HPI Comments:  Steven Barnes is a 43 y.o. obese male who presents to the Emergency Department complaining of an abscess to his posterior neck that started approximately one week ago. He states he was seen here  two days ago. Pt states he was prescribed Percocet and Septra and has been taking them as prescribed but states the area is not improving. He states he is not sure if the area has been draining. He reports a second abscess is starting to form on his back slightly below the original one on the neck. He denies fever, chills, nausea, or vomiting. Pt states he does not have a PCP.  Past Medical History  Diagnosis Date  . Knee arthropathy   . Chronic knee pain    Past Surgical History  Procedure Laterality Date  . Knee surgery    . Knee arthroscopy and arthrotomy     History reviewed. No pertinent family history. History  Substance Use Topics  . Smoking status: Current Every Day Smoker -- 1.00 packs/day for 15 years    Types: Cigarettes  . Smokeless tobacco: Never Used  . Alcohol Use: No    Review of Systems  Constitutional: Negative for fever and chills.  HENT: Negative for congestion and sore throat.   Eyes: Negative.   Respiratory: Negative for chest tightness and shortness of breath.   Cardiovascular: Negative for chest pain.  Gastrointestinal: Negative for nausea, vomiting and abdominal pain.  Genitourinary: Negative.    Musculoskeletal: Negative for arthralgias, joint swelling and neck pain.  Skin: Negative.  Negative for rash and wound.       Abscess on posterior neck.  Neurological: Negative for dizziness, weakness, light-headedness, numbness and headaches.  Psychiatric/Behavioral: Negative.     Allergies  Hornet venom and Aspirin  Home Medications   Prior to Admission medications   Medication Sig Start Date End Date Taking? Authorizing Provider  acetaminophen (TYLENOL) 500 MG tablet Take 500-1,000 mg by mouth every 6 (six) hours as needed. For pain   Yes Historical Provider, MD  ibuprofen (ADVIL,MOTRIN) 200 MG tablet Take 400 mg by mouth every 6 (six) hours as needed. For pain   Yes Historical Provider, MD  oxyCODONE-acetaminophen (PERCOCET/ROXICET) 5-325 MG per tablet Take 1 tablet by mouth every 4 (four) hours as needed. 06/08/14  Yes Tammy L. Triplett, PA-C  sulfamethoxazole-trimethoprim (SEPTRA DS) 800-160 MG per tablet Take 1 tablet by mouth 2 (two) times daily. For 10 days 06/08/14  Yes Tammy L. Triplett, PA-C  oxyCODONE-acetaminophen (PERCOCET/ROXICET) 5-325 MG per tablet Take 1 tablet by mouth every 4 (four) hours as needed. 06/10/14   Burgess AmorJulie Latiffany Harwick, PA-C   Triage Vitals: BP 159/84  Pulse 95  Temp(Src) 98.3 F (36.8 C) (Oral)  Resp 20  Ht 6' (1.829 m)  Wt 285 lb (129.275 kg)  BMI 38.64 kg/m2  SpO2 98% Physical Exam  Nursing note and vitals reviewed. Constitutional: He appears well-developed and well-nourished.  HENT:  Head: Normocephalic and atraumatic.  Eyes: Conjunctivae are normal.  Neck: Normal range of motion.  Cardiovascular: Normal rate, regular rhythm, normal heart sounds and intact distal pulses.   Pulmonary/Chest: Effort normal and breath sounds normal. He has no wheezes.  Abdominal: Soft. Bowel sounds are normal. There is no tenderness.  Musculoskeletal: Normal range of motion.  Neurological: He is alert.  Skin: Skin is warm and dry.  4 cm raised indurated abscess to  posterior neck that is starting to drain slightly. No red streaking. Tender. Small papule inferior to the main abscess.  Psychiatric: He has a normal mood and affect.    ED Course  Procedures (including critical care time) DIAGNOSTIC STUDIES: Oxygen Saturation is 98% on RA, normal by my interpretation.   COORDINATION OF CARE: 7:38 PM- Will order pain medication and I & D abscess. Pt verbalizes understanding and agrees to plan.  INCISION AND DRAINAGE PROCEDURE NOTE: Patient identification was confirmed and verbal consent was obtained. This procedure was performed by [name], PA-C at 8:08 PM. Site: posterior neck Sterile procedures observed Needle size: 25 G Anesthetic used (type and amt): Lidocaine 1% without Epinephrine (3 mLs) Blade size: 11 Drainage: small amount of purulence and blood Complexity: Complex Packing used: Iodoform 1/4 inch Site anesthetized, incision made over site, wound drained and explored loculations, rinsed with copious amounts of normal saline, wound packed with sterile gauze, covered with dry, sterile dressing.  Pt tolerated procedure well without complications.  Instructions for care discussed verbally and pt provided with additional written instructions for homecare and f/u.   Medications  lidocaine (PF) (XYLOCAINE) 1 % injection 5 mL (5 mLs Other Given by Other 06/10/14 2059)  oxyCODONE-acetaminophen (PERCOCET/ROXICET) 5-325 MG per tablet 2 tablet (2 tablets Oral Given 06/10/14 1944)    Labs Review Labs Reviewed - No data to display  Imaging Review No results found.   EKG Interpretation None      MDM   Final diagnoses:  Abscess    Continue bactrim, oxycodone refilled.  Advised to remove the packing in the am (very small amount packed) to keep site open until he starts soaks in am). Recheck here for any worsened sx or if this site does not start responding within the next 2 days.  I personally performed the services described in this  documentation, which was scribed in my presence. The recorded information has been reviewed and is accurate.    [name], PA-C 06/11/14 1356

## 2014-06-13 NOTE — ED Provider Notes (Signed)
Medical screening examination/treatment/procedure(s) were performed by non-physician practitioner and as supervising physician I was immediately available for consultation/collaboration.   EKG Interpretation None        Rolland PorterMark Gianmarco, MD 06/13/14 [phone]

## 2014-07-03 ENCOUNTER — Emergency Department (HOSPITAL_COMMUNITY)
Admission: EM | Admit: 2014-07-03 | Discharge: 2014-07-03 | Disposition: A | Discharge status: Home / Self Care | Payer: Self-pay | Attending: Emergency Medicine | Admitting: Emergency Medicine

## 2014-07-03 ENCOUNTER — Encounter (HOSPITAL_COMMUNITY): Payer: Self-pay | Admitting: Emergency Medicine

## 2014-07-03 DIAGNOSIS — F17200 Nicotine dependence, unspecified, uncomplicated: Secondary | ICD-10-CM | POA: Insufficient documentation

## 2014-07-03 DIAGNOSIS — Z79899 Other long term (current) drug therapy: Secondary | ICD-10-CM | POA: Insufficient documentation

## 2014-07-03 DIAGNOSIS — L678 Other hair color and hair shaft abnormalities: Secondary | ICD-10-CM | POA: Insufficient documentation

## 2014-07-03 DIAGNOSIS — Z8739 Personal history of other diseases of the musculoskeletal system and connective tissue: Secondary | ICD-10-CM | POA: Insufficient documentation

## 2014-07-03 DIAGNOSIS — L739 Follicular disorder, unspecified: Secondary | ICD-10-CM

## 2014-07-03 DIAGNOSIS — L738 Other specified follicular disorders: Secondary | ICD-10-CM | POA: Insufficient documentation

## 2014-07-03 DIAGNOSIS — Z8614 Personal history of Methicillin resistant Staphylococcus aureus infection: Secondary | ICD-10-CM | POA: Insufficient documentation

## 2014-07-03 DIAGNOSIS — L0211 Cutaneous abscess of neck: Secondary | ICD-10-CM | POA: Insufficient documentation

## 2014-07-03 DIAGNOSIS — G8929 Other chronic pain: Secondary | ICD-10-CM | POA: Insufficient documentation

## 2014-07-03 DIAGNOSIS — L03221 Cellulitis of neck: Secondary | ICD-10-CM

## 2014-07-03 MED ORDER — OXYCODONE-ACETAMINOPHEN 5-325 MG PO TABS
1.0000 | ORAL_TABLET | Freq: Once | ORAL | Status: AC
  Administered 2014-07-03: 1 via ORAL
  Filled 2014-07-03: qty 1

## 2014-07-03 MED ORDER — OXYCODONE-ACETAMINOPHEN 5-325 MG PO TABS: 1.0000 | ORAL_TABLET | ORAL | Status: DC | PRN

## 2014-07-03 MED ORDER — CEPHALEXIN 500 MG PO CAPS
500.0000 mg | ORAL_CAPSULE | Freq: Once | ORAL | Status: AC
  Administered 2014-07-03: 500 mg via ORAL
  Filled 2014-07-03: qty 1

## 2014-07-03 MED ORDER — MUPIROCIN 2 % EX OINT
TOPICAL_OINTMENT | Freq: Two times a day (BID) | CUTANEOUS | Status: DC
  Administered 2014-07-03: 16:00:00 via TOPICAL
  Filled 2014-07-03: qty 22

## 2014-07-03 MED ORDER — CEPHALEXIN 500 MG PO CAPS: 500.0000 mg | ORAL_CAPSULE | Freq: Two times a day (BID) | ORAL | Status: DC

## 2014-07-03 NOTE — ED Notes (Signed)
PT recently tx for abscess infection and finished antibiotic therapy that was prescribed. PT has abscess area to right posterior neck with swelling/redness/pain x4 days.

## 2014-07-03 NOTE — Care Management Note (Signed)
ED/CM noted patient did not have health insurance and/or PCP listed in the computer.  Patient was given the [place] resource handout with information on the clinics, food pantries, and the handout for new health insurance sign-up.  Patient expressed appreciation for information received. Pt was also given a Rx discount card.   

## 2014-07-03 NOTE — Discharge Instructions (Signed)
Folliculitis  Folliculitis is redness, soreness, and swelling (inflammation) of the hair follicles. This condition can occur anywhere on the body. People with weakened immune systems, diabetes, or obesity have a greater risk of getting folliculitis. CAUSES  Bacterial infection. This is the most common cause.  Fungal infection.  Viral infection.  Contact with certain chemicals, especially oils and tars. Long-term folliculitis can result from bacteria that live in the nostrils. The bacteria may trigger multiple outbreaks of folliculitis over time. SYMPTOMS Folliculitis most commonly occurs on the scalp, thighs, legs, back, buttocks, and areas where hair is shaved frequently. An early sign of folliculitis is a small, white or yellow, pus-filled, itchy lesion (pustule). These lesions appear on a red, inflamed follicle. They are usually less than 0.2 inches (5 mm) wide. When there is an infection of the follicle that goes deeper, it becomes a boil or furuncle. A group of closely packed boils creates a larger lesion (carbuncle). Carbuncles tend to occur in hairy, sweaty areas of the body. DIAGNOSIS  Your caregiver can usually tell what is wrong by doing a physical exam. A sample may be taken from one of the lesions and tested in a lab. This can help determine what is causing your folliculitis. TREATMENT  Treatment may include:  Applying warm compresses to the affected areas.  Taking antibiotic medicines orally or applying them to the skin.  Draining the lesions if they contain a large amount of pus or fluid.  Laser hair removal for cases of long-lasting folliculitis. This helps to prevent regrowth of the hair. HOME CARE INSTRUCTIONS  Apply warm compresses to the affected areas as directed by your caregiver.  If antibiotics are prescribed, take them as directed. Finish them even if you start to feel better.  You may take over-the-counter medicines to relieve itching.  Do not shave  irritated skin.  Follow up with your caregiver as directed. SEEK IMMEDIATE MEDICAL CARE IF:   You have increasing redness, swelling, or pain in the affected area.  You have a fever. MAKE SURE YOU:  Understand these instructions.  Will watch your condition.  Will get help right away if you are not doing well or get worse. Document Released: 01/03/2002 Document Revised: 04/25/2012 Document Reviewed: 01/25/2012 [facility] Patient Information 2015 [place], [place]. This information is not intended to replace advice given to you by your health care provider. Make sure you discuss any questions you have with your health care provider.   Take your entire course of antibiotics.  Use the antibiotic ointment given twice daily for up to 14 days.  You may take the oxycodone prescribed for pain relief.  This will make you drowsy - do not drive within 4 hours of taking this medication. Apply a warm compress to your neck for 15 minutes twice daily to help facilitate healing.  Avoid squeezing this site.

## 2014-07-04 NOTE — ED Provider Notes (Signed)
CSN: [id number]     Arrival date & time 07/03/14  1235 History   First MD Initiated Contact with Patient 07/03/14 1356     Chief Complaint  Patient presents with  . Abscess     (Consider location/radiation/quality/duration/timing/severity/associated sxs/prior Treatment) The history is provided by the patient.   Steven Barnes is a 43 y.o. male presenting with return of abscess infection on his neck.  He was seen for this same problem at the beginning of this month at which time he underwent a ten-day course of Bactrim in addition to I&D of the site.  The initial wound site has healed although it still has an indurated nodule, but he has not developed 2 additional infections near this original site.  The new area developed 4 days ago.  He has applied warm compresses without relief or drainage.  He denies fevers or chills and has taken no medications prior to arrival for his symptoms.  He does not have a history of MRSA.     Past Medical History  Diagnosis Date  . Knee arthropathy   . Chronic knee pain    Past Surgical History  Procedure Laterality Date  . Knee surgery    . Knee arthroscopy and arthrotomy     Family History  Problem Relation Age of Onset  . Hypertension Father    History  Substance Use Topics  . Smoking status: Current Every Day Smoker -- 1.00 packs/day for 15 years    Types: Cigarettes  . Smokeless tobacco: Never Used  . Alcohol Use: No    Review of Systems  Constitutional: Negative for fever and chills.  Respiratory: Negative for shortness of breath and wheezing.   Skin: Positive for wound.  Neurological: Negative for numbness.      Allergies  Hornet venom and Aspirin  Home Medications   Prior to Admission medications   Medication Sig Start Date End Date Taking? Authorizing Provider  cephALEXin (KEFLEX) 500 MG capsule Take 1 capsule (500 mg total) by mouth 2 (two) times daily. 07/03/14   [name], PA-C  oxyCODONE-acetaminophen (PERCOCET/ROXICET)  5-325 MG per tablet Take 1 tablet by mouth every 4 (four) hours as needed. 06/10/14   [name], PA-C  oxyCODONE-acetaminophen (PERCOCET/ROXICET) 5-325 MG per tablet Take 1 tablet by mouth every 4 (four) hours as needed. 07/03/14   [name], PA-C  sulfamethoxazole-trimethoprim (SEPTRA DS) 800-160 MG per tablet Take 1 tablet by mouth 2 (two) times daily. For 10 days 06/08/14   Tammy L. Triplett, PA-C   BP 142/92  Pulse 84  Temp(Src) 98.2 F (36.8 C) (Oral)  Resp 16  Ht 6' (1.829 m)  Wt 288 lb (130.636 kg)  BMI 39.05 kg/m2  SpO2 99% Physical Exam  Constitutional: He appears well-developed and well-nourished. No distress.  HENT:  Head: Normocephalic.  Neck: Neck supple.  Cardiovascular: Normal rate.   Pulmonary/Chest: Effort normal. He has no wheezes.  Musculoskeletal: Normal range of motion. He exhibits no edema.  Skin:  There is a 0.5 cm raised and indurated papule with a central yellow eschar on his right posterior neck.  1 cm below this there is a smaller papule.  There is no drainage from either of these sites, no surrounding redness or red streaking.  The original I&D site from earlier this month is about 1 cm towards the midline and appears no longer infected, but remains indurated.    ED Course  Procedures (including critical care time)  Informal bedside ultrasound performed with no  drainable pus pocket identified. Labs Review Labs Reviewed - No data to display  Imaging Review No results found.   EKG Interpretation None      MDM   Final diagnoses:  Folliculitis    The patient has completed an entire course of Bactrim earlier this month which did not appear to resolve this skin infection completely.  He was placed on Keflex, additionally was given mupirocin ointment to apply twice a day to this area after washing the skin with antimicrobial soap.  He was prescribed oxycodone as he endorsed significant pain with any range of motion of his neck.  Warm compresses.   Recheck if symptoms are not improving or for any worsened symptoms.    [name], PA-C 07/04/14 1927

## 2014-07-05 NOTE — ED Provider Notes (Signed)
Medical screening examination/treatment/procedure(s) were performed by non-physician practitioner and as supervising physician I was immediately available for consultation/collaboration.   EKG Interpretation None      Devoria Albe, MD, Armando Gang   Ward Givens, MD 07/05/14 0900

## 2015-03-02 ENCOUNTER — Emergency Department (HOSPITAL_COMMUNITY)
Admission: EM | Admit: 2015-03-02 | Discharge: 2015-03-02 | Disposition: A | Discharge status: Home / Self Care | Payer: Self-pay | Attending: Emergency Medicine | Admitting: Emergency Medicine

## 2015-03-02 ENCOUNTER — Encounter (HOSPITAL_COMMUNITY): Payer: Self-pay | Admitting: Emergency Medicine

## 2015-03-02 ENCOUNTER — Emergency Department (HOSPITAL_COMMUNITY): Payer: Self-pay

## 2015-03-02 DIAGNOSIS — G8929 Other chronic pain: Secondary | ICD-10-CM | POA: Insufficient documentation

## 2015-03-02 DIAGNOSIS — Z79899 Other long term (current) drug therapy: Secondary | ICD-10-CM | POA: Insufficient documentation

## 2015-03-02 DIAGNOSIS — Y998 Other external cause status: Secondary | ICD-10-CM | POA: Insufficient documentation

## 2015-03-02 DIAGNOSIS — S3991XA Unspecified injury of abdomen, initial encounter: Secondary | ICD-10-CM | POA: Insufficient documentation

## 2015-03-02 DIAGNOSIS — W182XXA Fall in (into) shower or empty bathtub, initial encounter: Secondary | ICD-10-CM | POA: Insufficient documentation

## 2015-03-02 DIAGNOSIS — S8001XA Contusion of right knee, initial encounter: Secondary | ICD-10-CM

## 2015-03-02 DIAGNOSIS — Z792 Long term (current) use of antibiotics: Secondary | ICD-10-CM | POA: Insufficient documentation

## 2015-03-02 DIAGNOSIS — S79912A Unspecified injury of left hip, initial encounter: Secondary | ICD-10-CM | POA: Insufficient documentation

## 2015-03-02 DIAGNOSIS — W19XXXA Unspecified fall, initial encounter: Secondary | ICD-10-CM

## 2015-03-02 DIAGNOSIS — M170 Bilateral primary osteoarthritis of knee: Secondary | ICD-10-CM

## 2015-03-02 DIAGNOSIS — Y93E1 Activity, personal bathing and showering: Secondary | ICD-10-CM | POA: Insufficient documentation

## 2015-03-02 DIAGNOSIS — Z720 Tobacco use: Secondary | ICD-10-CM | POA: Insufficient documentation

## 2015-03-02 DIAGNOSIS — S8002XA Contusion of left knee, initial encounter: Secondary | ICD-10-CM

## 2015-03-02 DIAGNOSIS — Y92091 Bathroom in other non-institutional residence as the place of occurrence of the external cause: Secondary | ICD-10-CM | POA: Insufficient documentation

## 2015-03-02 DIAGNOSIS — S76912A Strain of unspecified muscles, fascia and tendons at thigh level, left thigh, initial encounter: Secondary | ICD-10-CM

## 2015-03-02 HISTORY — DX: Opioid abuse, uncomplicated: F11.10

## 2015-03-02 MED ORDER — HYDROCODONE-ACETAMINOPHEN 5-325 MG PO TABS
1.0000 | ORAL_TABLET | Freq: Once | ORAL | Status: AC
  Administered 2015-03-02: 1 via ORAL
  Filled 2015-03-02: qty 1

## 2015-03-02 MED ORDER — OXYCODONE-ACETAMINOPHEN 5-325 MG PO TABS: 1.0000 | ORAL_TABLET | ORAL | Status: DC | PRN

## 2015-03-02 MED ORDER — IBUPROFEN 800 MG PO TABS: 800.0000 mg | ORAL_TABLET | Freq: Three times a day (TID) | ORAL | Status: DC

## 2015-03-02 NOTE — Discharge Instructions (Signed)
Adductor Muscle Strain with Rehab  The adductor muscles of the thigh are responsible for moving the leg across the body and are susceptible to muscle strains. A strain is an injury to a muscle or a tendon that attaches the muscle to a bone. Strains of the adductor muscles occur where the muscle tendons attach to the pelvic bone. A muscle strain may be a complete or partial tear of the muscle and may involve one or more of the adductor muscles. These strains are usually classified as a grade 1 or 2 strain. A grade 1 strain has no obvious sign of tearing or stretching of the muscle or tendon, but may include significant inflammation. A grade 2 strain is a moderate strain in which the muscle or tendon has been partially torn and has been stretched. Grade 2 strains are usually accompanied with loss of strength. A grade 3 muscle strain rarely occurs in the adductor muscles. A grade 3 strain is a complete tear of the muscle or tendon. SYMPTOMS   Occasionally there is a sudden "pop" felt or heard in the groin or inner thigh at the time of injury.  There may be pain, tenderness, swelling, warmth, or redness over the inner thigh and groin. This may be worsened by moving the hip (especially when spreading the legs or hips, pushing the legs against each other or kicking with the affected leg). There may be bruising (contusion) in the groin and inner thigh within 48 hours following the injury.  There may be loss of fullness of the muscle with complete rupture (uncommon).  Muscle spasm in the groin and inner thigh can occur. CAUSES   Prolonged overuse or a sudden increase in intensity, frequency, or duration of activity.  Single episode of stressful overactivity, such as during kicking.  Single violent blow or force to the inner thigh (less common). RISK INCREASES WITH:  Sports that require repeated kicking (soccer, martial arts, football), as well as sports that require the legs to be brought together  (gymnastics, horseback riding).  Sports that require rapid acceleration (ice hockey, track and field).  Poor strength and flexibility.  Previous thigh injury. PREVENTION   Warm up and stretch properly before activity.  Maintain physical fitness:  Hip and thigh flexibility.  Muscle strength and endurance.  Cardiovascular fitness.  Complete the entire course of rehabilitation after any lower extremity injury. Do this before returning to competition or practice. Follow suggestions of your caregiver. PROGNOSIS  If treated properly, adductor muscle strains usually heal well within 2 to 6 weeks. RELATED COMPLICATIONS   Healing time will be prolonged if the condition is not appropriately treated. It needs adequate time to heal.  Do not return to activity too soon. Recurrence of symptoms and reinjury are possible.  If left untreated, the strain may progress to a complete tear (rare) or other injury caused by limping and favoring the injured leg.  Prolonged disability is possible. TREATMENT Treatment initially involves ice and medication to help reduce pain and inflammation. Strength and stretching exercises are recommended to maintain strength and a full range of motion. Strenuous activities should be modified to prevent further injury. Using crutches for the first few days may help to lessen pain. On rare occasions, surgery is necessary to reattach the tendon to the bone. If pain becomes persistent or chronic after more than 3 months of nonsurgical treatment, surgery may also be recommended. MEDICATION   If pain medication is necessary, nonsteroidal anti-inflammatory medications, such as aspirin and ibuprofen, or  other minor pain relievers, such as acetaminophen, are often recommended. °· Do not take pain medication for 7 days before surgery or as advised. °· Prescription pain relievers may be given. Use only as directed and only as much as you need. °· Ointments applied to the skin may  be helpful. °· Corticosteroid injections may be given to reduce inflammation. °HEAT AND COLD °· Cold treatment (icing) relieves pain and reduces inflammation. Cold treatment should be applied for 10 to 15 minutes every 2 to 3 hours for inflammation and pain and immediately after any activity that aggravates your symptoms. Use ice packs or an ice massage. °· Heat treatment may be used prior to performing the stretching and strengthening activities prescribed by your caregiver, physical therapist, or athletic trainer. Use a heat pack or a warm soak. °SEEK MEDICAL CARE IF:  °· Symptoms get worse or do not improve in 2 weeks, despite treatment. °· New, unexplained symptoms develop. (Drugs used in treatment may produce side effects.) °EXERCISES  °RANGE OF MOTION (ROM) AND STRETCHING EXERCISES - Adductor Muscle Strain °These exercises may help you when beginning to rehabilitate your injury. Your symptoms may resolve with or without further involvement from your physician, physical therapist, or athletic trainer. While completing these exercises, remember:  °· Restoring tissue flexibility helps normal motion to return to the joints. This allows healthier, less painful movement and activity. °· An effective stretch should be held for at least 30 seconds. °· A stretch should never be painful. You should only feel a gentle lengthening or release in the stretched tissue. °STRETCH - Adductors, Lunge °· While standing, spread your legs. °· Lean away from your right / left leg by bending your opposite knee. You may rest your hands on your thigh for balance. °· You should feel a stretch in your right / left inner thigh. Hold for __________ seconds. °Repeat __________ times. Complete this exercise __________ times per day.  °STRETCH - Adductors, Standing °· Place your right / left foot on a counter or stable table. Turn away from your leg so both hips line up with your right / left leg. °· Keeping your hips facing forward, slowly  bend your opposite leg until you feel a gentle stretch on the inside of your right / left thigh. °· Hold for __________ seconds. °Repeat __________ times. Complete this exercise __________ times per day.  °STRETCH - Hip Adductors, Sitting  °· Sit on the floor and place the bottoms of your feet together. Keep your chest up and look straight ahead to keep your back in proper alignment. Slide your feet in towards your body as far as you can without rounding your back or increasing any discomfort. °· Gently push down on your knees until you feel a gentle stretch in your inner thighs. Hold this position for __________ seconds. °Repeat __________ times. Complete this exercise __________ times per day.  °STRETCH - Hamstrings/Adductors, V-Sit °· Sit on the floor with your legs extended in a large "V," keeping your knees straight. °· With your head and chest upright, bend at your waist reaching for your left foot to stretch your right adductors. °· You should feel a stretch in your right inner thigh. Hold for __________ seconds. °· Return to the upright position to relax your leg muscles. °· Continuing to keep your chest upright, bend straight forward at your waist to stretch your hamstrings. °· You should feel a stretch behind both of your thighs and/or knees. Hold for __________ seconds. °· Return to   the upright position to relax your leg muscles.  Repeat steps 2 through 4 for the right leg to stretch your left inner thigh. Repeat __________ times. Complete this exercise __________ times per day.  STRENGTHENING EXERCISES - Adductor Muscle Strain These exercises may help you when beginning to rehabilitate your injury. They may resolve your symptoms with or without further involvement from your physician, physical therapist, or athletic trainer. While completing these exercises, remember:   Muscles can gain both the endurance and the strength needed for everyday activities through controlled exercises.  Complete  these exercises as instructed by your physician, physical therapist, or athletic trainer. Progress the resistance and repetitions only as guided.  You may experience muscle soreness or fatigue, but the pain or discomfort you are trying to eliminate should never worsen during these exercises. If this pain does worsen, stop and make certain you are following the directions exactly. If the pain is still present after adjustments, discontinue the exercise until you can discuss the trouble with your caregiver. STRENGTH - Hip Adductors, Isometrics   Sit on a firm chair so that your knees are about the same height as your hips.  Place a large ball, firm pillow, or rolled up bath towel between your thighs.  Squeeze your thighs together, gradually building tension. Hold for __________ seconds.  Release the tension gradually and allow your inner thigh muscles to relax completely before repeating the exercise. Repeat __________ times. Complete this exercise __________ times per day.  STRENGTH - Hip Adductors, Straight Leg Raises   Lie on your side so that your head, shoulders, knee and hip line up. You may place your upper foot in front to help maintain your balance. Your right / left leg should be on the bottom.  Roll your hips slightly forward, so that your hips are stacked directly over each other and your right / left knee is facing forward.  Tense the muscles in your inner thigh and lift your bottom leg 4-6 inches. Hold this position for __________ seconds.  Slowly lower your leg to the starting position. Allow the muscles to fully relax before beginning the next repetition. Repeat __________ times. Complete this exercise __________ times per day.  Document Released: 10/25/2005 Document Revised: 01/17/2012 Document Reviewed: 02/06/2009 [facility] Patient Information 2015 [place], [place]. This information is not intended to replace advice given to you by your health care provider. Make sure you  discuss any questions you have with your health care provider.  Arthritis, Nonspecific Arthritis is inflammation of a joint. This usually means pain, redness, warmth or swelling are present. One or more joints may be involved. There are a number of types of arthritis. Your caregiver may not be able to tell what type of arthritis you have right away. CAUSES  The most common cause of arthritis is the wear and tear on the joint (osteoarthritis). This causes damage to the cartilage, which can break down over time. The knees, hips, back and neck are most often affected by this type of arthritis. Other types of arthritis and common causes of joint pain include:  Sprains and other injuries near the joint. Sometimes minor sprains and injuries cause pain and swelling that develop hours later.  Rheumatoid arthritis. This affects hands, feet and knees. It usually affects both sides of your body at the same time. It is often associated with chronic ailments, fever, weight loss and general weakness.  Crystal arthritis. Gout and pseudo gout can cause occasional acute severe pain, redness and swelling in  the foot, ankle, or knee.  Infectious arthritis. Bacteria can get into a joint through a break in overlying skin. This can cause infection of the joint. Bacteria and viruses can also spread through the blood and affect your joints.  Drug, infectious and allergy reactions. Sometimes joints can become mildly painful and slightly swollen with these types of illnesses. SYMPTOMS   Pain is the main symptom.  Your joint or joints can also be red, swollen and warm or hot to the touch.  You may have a fever with certain types of arthritis, or even feel overall ill.  The joint with arthritis will hurt with movement. Stiffness is present with some types of arthritis. DIAGNOSIS  Your caregiver will suspect arthritis based on your description of your symptoms and on your exam. Testing may be needed to find the type  of arthritis:  Blood and sometimes urine tests.  X-ray tests and sometimes CT or MRI scans.  Removal of fluid from the joint (arthrocentesis) is done to check for bacteria, crystals or other causes. Your caregiver (or a specialist) will numb the area over the joint with a local anesthetic, and use a needle to remove joint fluid for examination. This procedure is only minimally uncomfortable.  Even with these tests, your caregiver may not be able to tell what kind of arthritis you have. Consultation with a specialist (rheumatologist) may be helpful. TREATMENT  Your caregiver will discuss with you treatment specific to your type of arthritis. If the specific type cannot be determined, then the following general recommendations may apply. Treatment of severe joint pain includes:  Rest.  Elevation.  Anti-inflammatory medication (for example, ibuprofen) may be prescribed. Avoiding activities that cause increased pain.  Only take over-the-counter or prescription medicines for pain and discomfort as recommended by your caregiver.  Cold packs over an inflamed joint may be used for 10 to 15 minutes every hour. Hot packs sometimes feel better, but do not use overnight. Do not use hot packs if you are diabetic without your caregiver's permission.  A cortisone shot into arthritic joints may help reduce pain and swelling.  Any acute arthritis that gets worse over the next 1 to 2 days needs to be looked at to be sure there is no joint infection. Long-term arthritis treatment involves modifying activities and lifestyle to reduce joint stress jarring. This can include weight loss. Also, exercise is needed to nourish the joint cartilage and remove waste. This helps keep the muscles around the joint strong. HOME CARE INSTRUCTIONS   Do not take aspirin to relieve pain if gout is suspected. This elevates uric acid levels.  Only take over-the-counter or prescription medicines for pain, discomfort or fever  as directed by your caregiver.  Rest the joint as much as possible.  If your joint is swollen, keep it elevated.  Use crutches if the painful joint is in your leg.  Drinking plenty of fluids may help for certain types of arthritis.  Follow your caregiver's dietary instructions.  Try low-impact exercise such as:  Swimming.  Water aerobics.  Biking.  Walking.  Morning stiffness is often relieved by a warm shower.  Put your joints through regular range-of-motion. SEEK MEDICAL CARE IF:   You do not feel better in 24 hours or are getting worse.  You have side effects to medications, or are not getting better with treatment. SEEK IMMEDIATE MEDICAL CARE IF:   You have a fever.  You develop severe joint pain, swelling or redness.  Many joints  are involved and become painful and swollen.  There is severe back pain and/or leg weakness.  You have loss of bowel or bladder control. Document Released: 12/02/2004 Document Revised: 01/17/2012 Document Reviewed: 12/18/2008 [facility] Patient Information 2015 [place], [place]. This information is not intended to replace advice given to you by your health care provider. Make sure you discuss any questions you have with your health care provider.

## 2015-03-02 NOTE — ED Notes (Signed)
Pt fell in shower x 2 days ago onto right knee. Swelling noted to right knee superior. Medial left knee swelling noted. Pt also c/o Left groin pain from fall. Nad. Denies hitting head. Has been ambulatory but with pain.

## 2015-03-02 NOTE — ED Provider Notes (Signed)
CSN: [id number]     Arrival date & time 03/02/15  1330 History  This chart was scribed for non-physician practitioner [name], PA-C working with Eber Hong, MD by Littie Deeds, ED Scribe. This patient was seen in room APFT22/APFT22 and the patient's care was started at 2:16 PM.     Chief Complaint  Patient presents with  . Fall   The history is provided by the patient. No language interpreter was used.    HPI Comments: Steven Barnes is a 44 y.o. male with a hx of knee arthropathy (left knee) and PSHx of knee arthroscopy and arthrotomy who presents to the Emergency Department complaining of a fall that occurred 2 days ago. Patient fell as he was getting out of the bathtub and landed on both of his knees, but in frog leg position causing strain to his left groin which continues to be painful with weight bearing and left leg movement. He reports having associated bilateral knee pain, left greater than right, although the right knee was swollen more, but now improved after applying ice. The groin pain is worsened when bearing weight and with abduction at the left hip.  He denies bruising. Patient does not have an orthopedist as he does not have insurance. He notes allergies to aspirin.  There is no radiation of pain into his lower legs or feet.   Past Medical History  Diagnosis Date  . Knee arthropathy   . Chronic knee pain   . Narcotic abuse    Past Surgical History  Procedure Laterality Date  . Knee surgery    . Knee arthroscopy and arthrotomy     Family History  Problem Relation Age of Onset  . Hypertension Father    History  Substance Use Topics  . Smoking status: Current Every Day Smoker -- 1.00 packs/day for 15 years    Types: Cigarettes  . Smokeless tobacco: Never Used  . Alcohol Use: No    Review of Systems  Constitutional: Negative for fever.  Cardiovascular: Negative for leg swelling.  Musculoskeletal: Positive for joint swelling and arthralgias. Negative for myalgias  and back pain.  Skin: Negative for color change and wound.  Neurological: Negative for weakness and numbness.      Allergies  Hornet venom and Aspirin  Home Medications   Prior to Admission medications   Medication Sig Start Date End Date Taking? Authorizing Provider  acetaminophen (TYLENOL) 500 MG tablet Take 1,000 mg by mouth every 6 (six) hours as needed.   Yes Historical Provider, MD  cetirizine (ZYRTEC) 10 MG tablet Take 10 mg by mouth daily.   Yes Historical Provider, MD  cephALEXin (KEFLEX) 500 MG capsule Take 1 capsule (500 mg total) by mouth 2 (two) times daily. Patient not taking: Reported on 03/02/2015 07/03/14   [name], PA-C  ibuprofen (ADVIL,MOTRIN) 800 MG tablet Take 1 tablet (800 mg total) by mouth 3 (three) times daily. 03/02/15   [name], PA-C  oxyCODONE-acetaminophen (PERCOCET/ROXICET) 5-325 MG per tablet Take 1 tablet by mouth every 4 (four) hours as needed. 03/02/15   [name], PA-C  sulfamethoxazole-trimethoprim (SEPTRA DS) 800-160 MG per tablet Take 1 tablet by mouth 2 (two) times daily. For 10 days Patient not taking: Reported on 03/02/2015 06/08/14   Tammy Triplett, PA-C   BP 145/84 mmHg  Pulse 67  Temp(Src) 97.4 F (36.3 C) (Oral)  Resp 16  Ht 6' (1.829 m)  Wt 280 lb (127.007 kg)  BMI 37.97 kg/m2  SpO2 98% Physical Exam  Constitutional: He appears well-developed and well-nourished.  HENT:  Head: Atraumatic.  Neck: Normal range of motion.  Cardiovascular:  Pulses equal bilaterally  Musculoskeletal: He exhibits tenderness.       Left hip: He exhibits bony tenderness. He exhibits no swelling and no deformity.       Right knee: He exhibits swelling. He exhibits no effusion, no erythema, normal alignment, no LCL laxity and no MCL laxity. Tenderness found. Lateral joint line tenderness noted.       Left knee: He exhibits swelling and bony tenderness. He exhibits no effusion, no ecchymosis, no LCL laxity and no MCL laxity. Tenderness found. Medial joint  line tenderness noted.  He is tender along his left anterior groin  And proximal thigh without palpable deformity or bruising.   Neurological: He is alert. He has normal strength. He displays normal reflexes. No sensory deficit.  Skin: Skin is warm and dry.  Psychiatric: He has a normal mood and affect.  Nursing note and vitals reviewed.   ED Course  Procedures  DIAGNOSTIC STUDIES: Oxygen Saturation is 100% on room air, normal by my interpretation.    COORDINATION OF CARE: 2:22 PM-Discussed treatment plan which includes pain medication and XR imaging with pt at bedside and pt agreed to plan.    Labs Review Labs Reviewed - No data to display  Imaging Review Dg Knee Complete 4 Views Left  03/02/2015   CLINICAL DATA:  Status post fall 2 days ago.  EXAM: LEFT KNEE - COMPLETE 4+ VIEW  COMPARISON:  04/07/2014  FINDINGS: The left knee demonstrates no acute fracture or dislocation. There is no significant joint effusion. There is moderate osteoarthritis of the medial femorotibial compartment with joint space narrowing and marginal osteophytosis. There is mild lateral femorotibial compartment osteoarthritis. There is moderate -severe patellofemoral compartment osteoarthritis.  IMPRESSION: 1. No acute osseous injury of the left knee. 2. Tricompartmental osteoarthritis most severe in the patellofemoral compartment and medial femorotibial compartment.   Electronically Signed   By: Elige Ko   On: 03/02/2015 16:05   Dg Knee Complete 4 Views Right  03/02/2015   CLINICAL DATA:  Status post fall.  Pain.  EXAM: RIGHT KNEE - COMPLETE 4+ VIEW  COMPARISON:  None  FINDINGS: The right knee demonstrates no acute fracture or dislocation. There is no significant joint effusion. There is mild tricompartmental osteoarthritis most severe in the patellofemoral compartment. Small joint effusion.  IMPRESSION: No acute osseous injury of the right knee. Mild tricompartmental osteoarthritis.   Electronically Signed   By:  Elige Ko   On: 03/02/2015 16:06   Dg Hip Unilat With Pelvis 2-3 Views Left  03/02/2015   CLINICAL DATA:  Fall 2 days ago.  Pain  EXAM: LEFT HIP (WITH PELVIS) 2-3 VIEWS  COMPARISON:  None.  FINDINGS: There is no evidence of hip fracture or dislocation. There is no evidence of arthropathy or other focal bone abnormality.  IMPRESSION: Negative.   Electronically Signed   By: Marlan Palau M.D.   On: 03/02/2015 16:06     EKG Interpretation None      MDM   Final diagnoses:  Fall  Muscle strain of thigh, left, initial encounter  Knee contusion, left, initial encounter  Knee contusion, right, initial encounter  Primary osteoarthritis of both knees    Patients labs and/or radiological studies were reviewed and considered during the medical decision making and disposition process.  Results were also discussed with patient. Pt with suspected left hip adductor strain without palpable deformity  or bruising, so doubt tendon or muscle rupture.  Bilateral knee contusions with advanced arthritis left, minimal right.  These findings were discussed with patient.  Advised ice tx to areas of pain, activity as tolerated.  Prescribed ibuprofen, oxycodone. Pt reports has seen Dr. Romeo AppleHarrison in the past but is currently limited by lack of insurance.  He has no pcp. Referral given for this, including [facility].  Strongly encouraged his need to establish pcp. Advised recheck by Dr. Romeo AppleHarrison or return here if his symptoms persist or worsen.    I personally performed the services described in this documentation, which was scribed in my presence. The recorded information has been reviewed and is accurate.   Burgess AmorJulie Dacen Frayre, PA-C 03/03/15 2224  Eber HongBrian Miller, MD 03/06/15 [phone]

## 2015-03-02 NOTE — ED Notes (Signed)
Patient with no complaints at this time. Respirations even and unlabored. Skin warm/dry. Discharge instructions reviewed with patient at this time. Patient given opportunity to voice concerns/ask questions. Patient discharged at this time and left Emergency Department with steady gait.   

## 2016-04-08 ENCOUNTER — Emergency Department (HOSPITAL_COMMUNITY): Payer: No Typology Code available for payment source

## 2016-04-08 ENCOUNTER — Encounter (HOSPITAL_COMMUNITY): Payer: Self-pay | Admitting: Emergency Medicine

## 2016-04-08 ENCOUNTER — Emergency Department (HOSPITAL_COMMUNITY)
Admission: EM | Admit: 2016-04-08 | Discharge: 2016-04-08 | Disposition: A | Discharge status: Home / Self Care | Payer: No Typology Code available for payment source | Attending: Emergency Medicine | Admitting: Emergency Medicine

## 2016-04-08 DIAGNOSIS — Y999 Unspecified external cause status: Secondary | ICD-10-CM | POA: Insufficient documentation

## 2016-04-08 DIAGNOSIS — M25511 Pain in right shoulder: Secondary | ICD-10-CM

## 2016-04-08 DIAGNOSIS — Y92410 Unspecified street and highway as the place of occurrence of the external cause: Secondary | ICD-10-CM | POA: Diagnosis not present

## 2016-04-08 DIAGNOSIS — F17210 Nicotine dependence, cigarettes, uncomplicated: Secondary | ICD-10-CM | POA: Insufficient documentation

## 2016-04-08 DIAGNOSIS — Y9389 Activity, other specified: Secondary | ICD-10-CM | POA: Insufficient documentation

## 2016-04-08 MED ORDER — CYCLOBENZAPRINE HCL 10 MG PO TABS
10.0000 mg | ORAL_TABLET | Freq: Once | ORAL | Status: AC
  Administered 2016-04-08: 10 mg via ORAL
  Filled 2016-04-08: qty 1

## 2016-04-08 MED ORDER — HYDROCODONE-ACETAMINOPHEN 5-325 MG PO TABS
1.0000 | ORAL_TABLET | ORAL | Status: DC | PRN
Start: 2016-04-08 — End: 2018-05-24

## 2016-04-08 MED ORDER — CYCLOBENZAPRINE HCL 10 MG PO TABS: 10.0000 mg | ORAL_TABLET | Freq: Two times a day (BID) | ORAL | Status: DC | PRN

## 2016-04-08 MED ORDER — OXYCODONE-ACETAMINOPHEN 5-325 MG PO TABS: 2.0000 | ORAL_TABLET | ORAL | Status: DC | PRN
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
## 2016-04-08 NOTE — Discharge Instructions (Signed)
Do not drive while taking the muscle relaxant or narcotic because they will make you sleepy. Follow up with Dr. Romeo AppleHarrison or Dr. Hilda LiasKeeling if symptoms persist. Take your arm out of the sling several times a day and do range of motion.
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
## 2016-04-08 NOTE — ED Notes (Signed)
Patient given discharge instruction, verbalized understand pt given prepack pain medication and explain. Patient ambulatory out of the department with wife

## 2016-04-14 MED FILL — Oxycodone w/ Acetaminophen Tab 5-325 MG: ORAL | Qty: 6 | Status: AC

## 2016-07-17 ENCOUNTER — Emergency Department (HOSPITAL_COMMUNITY)
Admission: EM | Admit: 2016-07-17 | Discharge: 2016-07-18 | Disposition: A | Discharge status: Home / Self Care | Payer: Self-pay | Attending: Emergency Medicine | Admitting: Emergency Medicine

## 2016-07-17 ENCOUNTER — Emergency Department (HOSPITAL_COMMUNITY): Payer: Self-pay

## 2016-07-17 ENCOUNTER — Encounter (HOSPITAL_COMMUNITY): Payer: Self-pay | Admitting: Emergency Medicine

## 2016-07-17 DIAGNOSIS — F17210 Nicotine dependence, cigarettes, uncomplicated: Secondary | ICD-10-CM | POA: Insufficient documentation

## 2016-07-17 DIAGNOSIS — M7989 Other specified soft tissue disorders: Secondary | ICD-10-CM | POA: Insufficient documentation

## 2016-07-17 DIAGNOSIS — M25562 Pain in left knee: Secondary | ICD-10-CM | POA: Insufficient documentation

## 2016-07-17 MED ORDER — HYDROCODONE-ACETAMINOPHEN 5-325 MG PO TABS: 1.0000 | ORAL_TABLET | Freq: Four times a day (QID) | ORAL | 0 refills | Status: DC | PRN

## 2016-07-17 MED ORDER — HYDROMORPHONE HCL 2 MG/ML IJ SOLN
2.0000 mg | Freq: Once | INTRAMUSCULAR | Status: AC
  Administered 2016-07-17: 2 mg via INTRAMUSCULAR
  Filled 2016-07-17: qty 1

## 2016-07-17 MED ORDER — NAPROXEN 500 MG PO TABS: 500.0000 mg | ORAL_TABLET | Freq: Two times a day (BID) | ORAL | 0 refills | Status: DC

## 2016-07-17 NOTE — Discharge Instructions (Signed)
Immobilizer as needed for comfort. Crutches as needed.  follow-up with orthopedics. Take the Naprosyn as directed and supplement with the hydrocodone as needed for additional pain relief. Return for any new or worse symptoms.

## 2016-07-17 NOTE — ED Triage Notes (Signed)
Pt reports waking with L knee edema and pain. Pt unable to bear weight on the knee. Knee edematous and red.

## 2016-07-17 NOTE — ED Provider Notes (Addendum)
[facility]-EMERGENCY DEPT Provider Note   CSN: [id number] Arrival date & time: 07/17/16  2046  By signing my name below, I, Steven Barnes, attest that this documentation has been prepared under the direction and in the presence of [name], MD . Electronically Signed: Majel Barnes, Scribe. 07/17/2016. 10:00 PM.  History   Chief Complaint Chief Complaint  Patient presents with  . Knee Pain   The history is provided by the patient. No language interpreter was used.   HPI Comments: Steven Barnes is a 45 y.o. male with PMHx of chronic knee pain and left knee arthroplasty, who presents to the Emergency Department complaining of gradually worsening, 9/10, left knee pain that began this morning and worsened PTA. Pt reports he woke up with redness and swelling to his left knee. He states he has used ice with no relief. He denies recent fall or injury, wounds on his left knee and hx of gout.   Past Medical History:  Diagnosis Date  . Chronic knee pain   . Knee arthropathy   . Narcotic abuse    There are no active problems to display for this patient.  Past Surgical History:  Procedure Laterality Date  . KNEE ARTHROSCOPY AND ARTHROTOMY    . KNEE SURGERY      Home Medications    Prior to Admission medications   Medication Sig Start Date End Date Taking? Authorizing Provider  acetaminophen (TYLENOL) 500 MG tablet Take 1,000 mg by mouth every 6 (six) hours as needed.    Historical Provider, MD  cetirizine (ZYRTEC) 10 MG tablet Take 10 mg by mouth daily.    Historical Provider, MD  cyclobenzaprine (FLEXERIL) 10 MG tablet Take 1 tablet (10 mg total) by mouth 2 (two) times daily as needed for muscle spasms. 04/08/16   [name], NP  HYDROcodone-acetaminophen (NORCO/VICODIN) 5-325 MG tablet Take 1 tablet by mouth every 4 (four) hours as needed. 04/08/16   [name], NP  HYDROcodone-acetaminophen (NORCO/VICODIN) 5-325 MG tablet Take 1-2 tablets by mouth every 6 (six) hours as needed. 07/17/16   [name], MD  ibuprofen (ADVIL,MOTRIN) 800 MG tablet Take 1 tablet (800 mg total) by mouth 3 (three) times daily. 03/02/15   [name], PA-C  naproxen (NAPROSYN) 500 MG tablet Take 1 tablet (500 mg total) by mouth 2 (two) times daily. 07/17/16   [name], MD  oxyCODONE-acetaminophen (PERCOCET/ROXICET) 5-325 MG tablet Take 2 tablets by mouth every 4 (four) hours as needed for severe pain. 04/08/16   [name], NP    Family History Family History  Problem Relation Age of Onset  . Hypertension Father     Social History Social History  Substance Use Topics  . Smoking status: Current Every Day Smoker    Packs/day: 1.00    Years: 15.00    Types: Cigarettes  . Smokeless tobacco: Never Used  . Alcohol use No     Allergies   Hornet venom and Aspirin  Review of Systems Review of Systems  Constitutional: Negative for fever.  HENT: Negative for rhinorrhea and sore throat.   Eyes: Negative for visual disturbance.  Respiratory: Negative for cough and shortness of breath.   Cardiovascular: Positive for leg swelling (left knee). Negative for chest pain.  Gastrointestinal: Negative for abdominal pain, diarrhea, nausea and vomiting.  Genitourinary: Negative for dysuria.  Musculoskeletal: Positive for arthralgias. Negative for back pain.  Skin: Negative for rash.  Hematological: Does not bruise/bleed easily.   Physical Exam Updated Vital  Signs BP 159/79 (BP Location: Left Arm)   Pulse 95   Temp 98.5 F (36.9 C) (Oral)   Resp 20   Ht 6' (1.829 m)   Wt 272 lb (123.4 kg)   SpO2 100%   BMI 36.89 kg/m   Physical Exam  Constitutional: He is oriented to person, place, and time. He appears well-developed and well-nourished.  HENT:  Head: Normocephalic and atraumatic.  Mouth/Throat: No oropharyngeal exudate.  Moist mucous membranes  Eyes: Conjunctivae and EOM are normal. Pupils are equal, round, and reactive to light. Right eye exhibits no discharge. Left eye exhibits no  discharge. No scleral icterus.  Eyes tracking normal  Neck: Normal range of motion. No JVD present. No tracheal deviation present.  Cardiovascular: Normal rate and regular rhythm.   Pulmonary/Chest: Effort normal. No stridor.  Lungs clear bilaterally  Abdominal: Bowel sounds are normal. There is no tenderness.  Musculoskeletal: He exhibits edema.  Cap refill 1 second to left big toe  No swelling in ankles Do not feel DP pulse but PT pulse is 1+ Right knee: kneecap is in place, no effusion Left knee: increased warmth, slight bit of redness but not a lot, no joint effusion, swelling and puffiness above knee medially and laterally  Neurological: He is alert and oriented to person, place, and time. Coordination normal.  Psychiatric: He has a normal mood and affect. His behavior is normal. Judgment and thought content normal.  Nursing note and vitals reviewed.  ED Treatments / Results  Labs (all labs ordered are listed, but only abnormal results are displayed) Labs Reviewed - No data to display  EKG  EKG Interpretation None       Radiology Dg Knee Complete 4 Views Left  Result Date: 07/17/2016 CLINICAL DATA:  Pain.  No reported trauma. EXAM: LEFT KNEE - COMPLETE 4+ VIEW COMPARISON:  None. FINDINGS: Severe degenerative changes are seen most prominent in medial patellofemoral compartments. There is a suprapatellar joint effusion. No other acute abnormalities. No fractures. IMPRESSION: Degenerative changes.  Suprapatellar joint effusion. Electronically Signed   By: Gerome Samavid  Williams III M.D   On: 07/17/2016 23:10    Procedures Procedures  DIAGNOSTIC STUDIES:  Oxygen Saturation is 100% on RA, normal by my interpretation.    COORDINATION OF CARE:  10:00 PM Discussed treatment plan with pt at bedside and pt agreed to plan.  Medications Ordered in ED Medications  HYDROmorphone (DILAUDID) injection 2 mg (2 mg Intramuscular Given 07/17/16 2215)     Initial Impression / Assessment and  Plan / ED Course  I have reviewed the triage vital signs and the nursing notes.  Pertinent labs & imaging results that were available during my care of the patient were reviewed by me and considered in my medical decision making (see chart for details).  Clinical Course    Patient with old injury to the left knee. Clinically he had a suprapatellar effusion. X-rays confirm that. No acute bony injuries. Will treat symptomatically with crutches knee immobilizer Naprosyn and hydrocodone and follow-up with orthopedics.  In addition patient's had no new injury to the knee. No break in the skin.  I personally performed the services described in this documentation, which was scribed in my presence. The recorded information has been reviewed and is accurate.   Final Clinical Impressions(s) / ED Diagnoses   Final diagnoses:  Knee pain, acute, left    New Prescriptions New Prescriptions   HYDROCODONE-ACETAMINOPHEN (NORCO/VICODIN) 5-325 MG TABLET    Take 1-2 tablets by mouth every  6 (six) hours as needed.   NAPROXEN (NAPROSYN) 500 MG TABLET    Take 1 tablet (500 mg total) by mouth 2 (two) times daily.     [name], MD 07/17/16 2329    [name], MD 07/17/16 2329

## 2017-04-30 ENCOUNTER — Emergency Department (HOSPITAL_COMMUNITY)
Admission: EM | Admit: 2017-04-30 | Discharge: 2017-04-30 | Disposition: A | Discharge status: Home / Self Care | Payer: Self-pay | Attending: Emergency Medicine | Admitting: Emergency Medicine

## 2017-04-30 ENCOUNTER — Encounter (HOSPITAL_COMMUNITY): Payer: Self-pay | Admitting: Emergency Medicine

## 2017-04-30 DIAGNOSIS — J3489 Other specified disorders of nose and nasal sinuses: Secondary | ICD-10-CM | POA: Insufficient documentation

## 2017-04-30 DIAGNOSIS — F17210 Nicotine dependence, cigarettes, uncomplicated: Secondary | ICD-10-CM | POA: Insufficient documentation

## 2017-04-30 DIAGNOSIS — R0981 Nasal congestion: Secondary | ICD-10-CM | POA: Insufficient documentation

## 2017-04-30 DIAGNOSIS — J029 Acute pharyngitis, unspecified: Secondary | ICD-10-CM | POA: Insufficient documentation

## 2017-04-30 MED ORDER — AZITHROMYCIN 250 MG PO TABS: ORAL_TABLET | ORAL | 0 refills | Status: DC

## 2017-04-30 NOTE — ED Triage Notes (Signed)
PT c/o nasal congestion, sore throat, and loosing his voice at times x2 weeks unrelieved by OTC allergy medications.

## 2017-04-30 NOTE — Discharge Instructions (Signed)
Continue taking the mucinex and claritin as directed.  Drink plenty of fluids.  Follow-up with your doctor for recheck

## 2017-04-30 NOTE — ED Provider Notes (Signed)
[facility]-EMERGENCY DEPT Provider Note   CSN: [id number] Arrival date & time: 04/30/17  0900     History   Chief Complaint Chief Complaint  Patient presents with  . Nasal Congestion    HPI Steven Barnes is a 46 y.o. male.  HPI   Steven Barnes is a 46 y.o. male who presents to the Emergency Department complaining of nasal congestion, sore throat and lose of voice.  Symptoms present for 2 weeks.  He has been taking OTC allergy and cough medication without relief.  He reports pain to his throat with swallowing.  Lose of voice is intermittent.  He denies cough, shortness of breath, fever and chills.    Past Medical History:  Diagnosis Date  . Chronic knee pain   . Knee arthropathy   . Narcotic abuse     There are no active problems to display for this patient.   Past Surgical History:  Procedure Laterality Date  . KNEE ARTHROSCOPY AND ARTHROTOMY    . KNEE SURGERY         Home Medications    Prior to Admission medications   Medication Sig Start Date End Date Taking? Authorizing Provider  acetaminophen (TYLENOL) 500 MG tablet Take 1,000 mg by mouth every 6 (six) hours as needed.    [provider]  azithromycin (ZITHROMAX) 250 MG tablet Take first 2 tablets together, then 1 every day until finished. 04/30/17   Casmira Cramer, PA-C  cetirizine (ZYRTEC) 10 MG tablet Take 10 mg by mouth daily.    [provider]  cyclobenzaprine (FLEXERIL) 10 MG tablet Take 1 tablet (10 mg total) by mouth 2 (two) times daily as needed for muscle spasms. 04/08/16   Janne NapoleonNeese, Hope M, NP  HYDROcodone-acetaminophen (NORCO/VICODIN) 5-325 MG tablet Take 1 tablet by mouth every 4 (four) hours as needed. 04/08/16   Janne NapoleonNeese, Hope M, NP  HYDROcodone-acetaminophen (NORCO/VICODIN) 5-325 MG tablet Take 1-2 tablets by mouth every 6 (six) hours as needed. 07/17/16   Vanetta MuldersZackowski, Scott, MD  ibuprofen (ADVIL,MOTRIN) 800 MG tablet Take 1 tablet (800 mg total) by mouth 3 (three) times daily. 03/02/15   Burgess AmorIdol,  Julie, PA-C  naproxen (NAPROSYN) 500 MG tablet Take 1 tablet (500 mg total) by mouth 2 (two) times daily. 07/17/16   Vanetta MuldersZackowski, Scott, MD  oxyCODONE-acetaminophen (PERCOCET/ROXICET) 5-325 MG tablet Take 2 tablets by mouth every 4 (four) hours as needed for severe pain. 04/08/16   Janne NapoleonNeese, Hope M, NP    Family History Family History  Problem Relation Age of Onset  . Hypertension Father     Social History Social History  Substance Use Topics  . Smoking status: Current Every Day Smoker    Packs/day: 1.00    Years: 15.00    Types: Cigarettes  . Smokeless tobacco: Never Used  . Alcohol use No     Allergies   Hornet venom and Aspirin   Review of Systems Review of Systems  Constitutional: Negative for activity change, appetite change, chills and fever.  HENT: Positive for congestion, rhinorrhea, sore throat and voice change. Negative for facial swelling and trouble swallowing.   Eyes: Negative for visual disturbance.  Respiratory: Negative for cough, shortness of breath, wheezing and stridor.   Gastrointestinal: Negative for nausea and vomiting.  Musculoskeletal: Negative for neck pain and neck stiffness.  Skin: Negative.  Negative for rash.  Neurological: Negative for dizziness, weakness, numbness and headaches.  Hematological: Negative for adenopathy.  Psychiatric/Behavioral: Negative for confusion.  All other systems reviewed and are  negative.    Physical Exam Updated Vital Signs BP (!) 157/91 (BP Location: Left Arm)   Pulse 78   Temp 97.6 F (36.4 C) (Oral)   Resp 18   Ht 6' (1.829 m)   Wt 127 kg (280 lb)   SpO2 97%   BMI 37.97 kg/m   Physical Exam  Constitutional: He is oriented to person, place, and time. He appears well-developed and well-nourished. No distress.  HENT:  Head: Normocephalic and atraumatic.  Right Ear: Tympanic membrane and ear canal normal.  Left Ear: Tympanic membrane and ear canal normal.  Nose: Mucosal edema and rhinorrhea present. Right  sinus exhibits no maxillary sinus tenderness and no frontal sinus tenderness. Left sinus exhibits no maxillary sinus tenderness and no frontal sinus tenderness.  Mouth/Throat: Uvula is midline and mucous membranes are normal. No trismus in the jaw. No uvula swelling. Posterior oropharyngeal erythema present. No oropharyngeal exudate, posterior oropharyngeal edema or tonsillar abscesses.  Eyes: Conjunctivae are normal.  Neck: Normal range of motion and phonation normal. Neck supple. No Brudzinski's sign and no Kernig's sign noted.  Cardiovascular: Normal rate, regular rhythm, normal heart sounds and intact distal pulses.   No murmur heard. Pulmonary/Chest: Effort normal and breath sounds normal. No respiratory distress. He has no wheezes. He has no rales.  Abdominal: Soft. He exhibits no distension. There is no tenderness. There is no rebound and no guarding.  Musculoskeletal: Normal range of motion. He exhibits no edema.  Lymphadenopathy:    He has no cervical adenopathy.  Neurological: He is alert and oriented to person, place, and time. He exhibits normal muscle tone. Coordination normal.  Skin: Skin is warm and dry.  Nursing note and vitals reviewed.    ED Treatments / Results  Labs (all labs ordered are listed, but only abnormal results are displayed) Labs Reviewed - No data to display  EKG  EKG Interpretation None       Radiology No results found.  Procedures Procedures (including critical care time)  Medications Ordered in ED Medications - No data to display   Initial Impression / Assessment and Plan / ED Course  I have reviewed the triage vital signs and the nursing notes.  Pertinent labs & imaging results that were available during my care of the patient were reviewed by me and considered in my medical decision making (see chart for details).     Pt well appearing.  Vitals stable.  Airway patent.  Tolerating fluids.  No evidence of PTA or pharyngeal abscess. Will  treat and agrees to PCP f/u if needed.   Final Clinical Impressions(s) / ED Diagnoses   Final diagnoses:  Sore throat    New Prescriptions Discharge Medication List as of 04/30/2017  9:37 AM    START taking these medications   Details  azithromycin (ZITHROMAX) 250 MG tablet Take first 2 tablets together, then 1 every day until finished., Print         Pauline Aus, PA-C 04/30/17 1102    [name], MD 05/06/17 1720

## 2017-05-26 DIAGNOSIS — E139 Other specified diabetes mellitus without complications: Secondary | ICD-10-CM | POA: Insufficient documentation

## 2017-06-16 DIAGNOSIS — I10 Essential (primary) hypertension: Secondary | ICD-10-CM | POA: Insufficient documentation

## 2017-06-16 DIAGNOSIS — L84 Corns and callosities: Secondary | ICD-10-CM | POA: Insufficient documentation

## 2018-05-24 ENCOUNTER — Emergency Department (HOSPITAL_COMMUNITY)
Admission: EM | Admit: 2018-05-24 | Discharge: 2018-05-25 | Disposition: A | Discharge status: Home / Self Care | Payer: Self-pay | Attending: Emergency Medicine | Admitting: Emergency Medicine

## 2018-05-24 ENCOUNTER — Other Ambulatory Visit: Payer: Self-pay

## 2018-05-24 ENCOUNTER — Emergency Department (HOSPITAL_COMMUNITY): Payer: Self-pay

## 2018-05-24 ENCOUNTER — Encounter (HOSPITAL_COMMUNITY): Payer: Self-pay | Admitting: *Deleted

## 2018-05-24 DIAGNOSIS — Y93H3 Activity, building and construction: Secondary | ICD-10-CM | POA: Insufficient documentation

## 2018-05-24 DIAGNOSIS — Y9269 Other specified industrial and construction area as the place of occurrence of the external cause: Secondary | ICD-10-CM | POA: Insufficient documentation

## 2018-05-24 DIAGNOSIS — L03115 Cellulitis of right lower limb: Secondary | ICD-10-CM | POA: Insufficient documentation

## 2018-05-24 DIAGNOSIS — S91331A Puncture wound without foreign body, right foot, initial encounter: Secondary | ICD-10-CM | POA: Insufficient documentation

## 2018-05-24 DIAGNOSIS — F17210 Nicotine dependence, cigarettes, uncomplicated: Secondary | ICD-10-CM | POA: Insufficient documentation

## 2018-05-24 DIAGNOSIS — Y990 Civilian activity done for income or pay: Secondary | ICD-10-CM | POA: Insufficient documentation

## 2018-05-24 DIAGNOSIS — Z23 Encounter for immunization: Secondary | ICD-10-CM | POA: Insufficient documentation

## 2018-05-24 DIAGNOSIS — W450XXA Nail entering through skin, initial encounter: Secondary | ICD-10-CM | POA: Insufficient documentation

## 2018-05-24 LAB — CBC WITH DIFFERENTIAL/PLATELET
Basophils Absolute: 0 10*3/uL (ref 0.0–0.1)
Basophils Relative: 0 %
EOS PCT: 1 %
Eosinophils Absolute: 0.2 10*3/uL (ref 0.0–0.7)
HCT: 42.1 % (ref 39.0–52.0)
HEMOGLOBIN: 14.5 g/dL (ref 13.0–17.0)
LYMPHS PCT: 18 %
Lymphs Abs: 2.8 10*3/uL (ref 0.7–4.0)
MCH: 29.8 pg (ref 26.0–34.0)
MCHC: 34.4 g/dL (ref 30.0–36.0)
MCV: 86.4 fL (ref 78.0–100.0)
Monocytes Absolute: 1.6 10*3/uL — ABNORMAL HIGH (ref 0.1–1.0)
Monocytes Relative: 10 %
NEUTROS PCT: 71 %
Neutro Abs: 10.8 10*3/uL — ABNORMAL HIGH (ref 1.7–7.7)
PLATELETS: 222 10*3/uL (ref 150–400)
RBC: 4.87 MIL/uL (ref 4.22–5.81)
RDW: 13.2 % (ref 11.5–15.5)
WBC: 15.4 10*3/uL — AB (ref 4.0–10.5)

## 2018-05-24 MED ORDER — PIPERACILLIN-TAZOBACTAM IN DEX 2-0.25 GM/50ML IV SOLN: 2.2500 g | Freq: Once | INTRAVENOUS | Status: DC

## 2018-05-24 MED ORDER — TETANUS-DIPHTH-ACELL PERTUSSIS 5-2.5-18.5 LF-MCG/0.5 IM SUSP
0.5000 mL | Freq: Once | INTRAMUSCULAR | Status: AC
  Administered 2018-05-24: 0.5 mL via INTRAMUSCULAR
  Filled 2018-05-24: qty 0.5

## 2018-05-24 MED ORDER — MORPHINE SULFATE (PF) 4 MG/ML IV SOLN
4.0000 mg | Freq: Once | INTRAVENOUS | Status: AC
  Administered 2018-05-24: 4 mg via INTRAVENOUS
  Filled 2018-05-24: qty 1

## 2018-05-24 NOTE — ED Provider Notes (Addendum)
[facility] EMERGENCY DEPARTMENT Provider Note   CSN: [id number] Arrival date & time: 05/24/18  2225     History   Chief Complaint Chief Complaint  Patient presents with  . Foot Pain    HPI Steven Barnes is a 47 y.o. male with PMH/o narcotic abuse who presents for evaluation of right foot wound, redness, swelling.  Patient reports that yesterday, he found an nail in the plantar surface of his right foot.  Patient reports that the nail had gone through his boot into his foot.  He is unsure of how long it had been there.  Patient reports that he does work at a [employer] site.  Patient reports he was able to remove the nail himself.  Patient reports that since then he has had worsening pain to the area.  Additionally, he notes redness and swelling that has begun to spread towards the dorsal aspect of his foot.  He reports he has been able to ambulate but does report worsening pain with ambulation.  Patient denies any history of diabetes or neuropathy.  He states that he will some time to have some tingling sensation in his foot.  Patient denies any fevers, numbness/weakness.  She is unsure when his last tetanus was.  The history is provided by the patient.    Past Medical History:  Diagnosis Date  . Chronic knee pain   . Knee arthropathy   . Narcotic abuse (HCC)     There are no active problems to display for this patient.   Past Surgical History:  Procedure Laterality Date  . KNEE ARTHROSCOPY AND ARTHROTOMY    . KNEE SURGERY          Home Medications    Prior to Admission medications   Medication Sig Start Date End Date Taking? Authorizing Provider  loratadine (CLARITIN) 10 MG tablet Take 10 mg by mouth at bedtime.   Yes [provider]  Multiple Vitamin (MULTIVITAMIN WITH MINERALS) TABS tablet Take 1 tablet by mouth at bedtime.   Yes [provider]  ciprofloxacin (CIPRO) 500 MG tablet Take 1 tablet (500 mg total) by mouth 2 (two) times daily for 7  days. 05/25/18 06/01/18  Maxwell Caul, PA-C  clindamycin (CLEOCIN) 150 MG capsule Take 3 capsules (450 mg total) by mouth every 6 (six) hours for 7 days. 05/25/18 06/01/18  Maxwell Caul, PA-C  HYDROcodone-acetaminophen (NORCO/VICODIN) 5-325 MG tablet Take 1-2 tablets by mouth every 6 (six) hours as needed. 05/25/18   Maxwell Caul, PA-C    Family History Family History  Problem Relation Age of Onset  . Hypertension Father     Social History Social History   Tobacco Use  . Smoking status: Current Every Day Smoker    Packs/day: 1.00    Years: 15.00    Pack years: 15.00    Types: Cigarettes  . Smokeless tobacco: Never Used  Substance Use Topics  . Alcohol use: No  . Drug use: No     Allergies   Hornet venom and Aspirin   Review of Systems Review of Systems  Constitutional: Negative for fever.  Gastrointestinal: Negative for vomiting.  Skin: Positive for color change and wound.  Neurological: Negative for weakness and numbness.  All other systems reviewed and are negative.    Physical Exam Updated Vital Signs BP 131/78   Pulse 89   Temp 97.8 F (36.6 C)   Resp 18   Ht 6' (1.829 m)   Wt 120.2 kg (265  lb)   SpO2 95%   BMI 35.94 kg/m   Physical Exam  Constitutional: He appears well-developed and well-nourished.  HENT:  Head: Normocephalic and atraumatic.  Eyes: Conjunctivae and EOM are normal. Right eye exhibits no discharge. Left eye exhibits no discharge. No scleral icterus.  Cardiovascular:  Pulses:      Dorsalis pedis pulses are 2+ on the right side, and 2+ on the left side.  Pulmonary/Chest: Effort normal.  Musculoskeletal:  Tenderness palpation noted to the plantar surface of the foot.  Dorsiflexion and plantarflexion of right foot intact without any difficulty.  No tenderness palpation to the distal tib-fib, proximal tib-fib, knee.  No abnormalities of the left lower extremity.   Neurological: He is alert.  Sensation intact along major nerve  distributions of BLE  Skin: Skin is warm and dry. Capillary refill takes less than 2 seconds.  Good distal cap refill. RLE is not dusky in appearance or cool to touch.  Patient with diffuse thickened skin, calluses noted to the plantar surface of the right foot.  He has an approximately 1cm wound with area of skin breakdown.  Patient with warmth, erythema, edema noted to the lateral edge of the foot that extends to the dorsal midfoot area.  This does not extend over the joint line of the ankle.    Psychiatric: He has a normal mood and affect. His speech is normal and behavior is normal.  Nursing note and vitals reviewed.    ED Treatments / Results  Labs (all labs ordered are listed, but only abnormal results are displayed) Labs Reviewed  CBC WITH DIFFERENTIAL/PLATELET - Abnormal; Notable for the following components:      Result Value   WBC 15.4 (*)    Neutro Abs 10.8 (*)    Monocytes Absolute 1.6 (*)    All other components within normal limits  COMPREHENSIVE METABOLIC PANEL - Abnormal; Notable for the following components:   Glucose, Bld 251 (*)    All other components within normal limits    EKG None  Radiology Dg Foot Complete Right  Result Date: 05/24/2018 CLINICAL DATA:  47 y/o M; pain, swelling, and redness to the right foot. EXAM: RIGHT FOOT COMPLETE - 3+ VIEW COMPARISON:  None. FINDINGS: No acute fracture or dislocation identified. Lisfranc alignment is maintained. Osteoarthrosis of the first metatarsophalangeal joint. Intertarsal osteoarthrosis with osteophytosis. Productive changes of the first digit sesamoids. IMPRESSION: 1. No acute fracture or dislocation identified. 2. First metatarsophalangeal, first metatarso-sesamoid, and intertarsal joint osteoarthrosis. Electronically Signed   By: Mitzi Hansen M.D.   On: 05/24/2018 22:58    Procedures Procedures (including critical care time)  Medications Ordered in ED Medications  morphine 4 MG/ML injection 4 mg  (4 mg Intravenous Given 05/24/18 2330)  Tdap (BOOSTRIX) injection 0.5 mL (0.5 mLs Intramuscular Given 05/24/18 2331)  piperacillin-tazobactam (ZOSYN) IVPB 3.375 g (0 g Intravenous Stopped 05/25/18 0108)     Initial Impression / Assessment and Plan / ED Course  I have reviewed the triage vital signs and the nursing notes.  Pertinent labs & imaging results that were available during my care of the patient were reviewed by me and considered in my medical decision making (see chart for details).  Clinical Course as of May 25 120  Thu May 25, 2018  0042 HCT: 42.1 [LL]    Clinical Course User Index [LL] Maxwell Caul, PA-C    47 y.o. M with past history of narcotic abuse who presents for evaluation of right foot wound  that began yesterday.  Patient states that he pulled his boot off and noted a nail that was in his foot.  Unsure when he got there.  Denies any history of diabetes or neuropathy but this he has some tingling in his feet intermittently.  States that since then, the wound has become more painful and he says started to have redness, swelling noted to the foot.  No fevers. Patient is afebrile, non-toxic appearing, sitting comfortably on examination table. Vital signs reviewed and stable. Patient is neurovascularly intact.  On exam, patient has a 1 cm area of wound with skin breakdown noted to the plantar surface of the right foot.  There is surrounding erythema, warmth noted to the dorsal aspect of the foot.  We will plan to check basic labs and do IV antibiotics while here in the ED.  Plan to update tetanus here in the department.  X-ray ordered at triage.  Concern for cellulitis versus open fracture.  History/physical exam is not concerning for septic arthritis, DVT of lower extremity, acute arterial embolism.  Foot x-ray negative for any acute fracture dislocation.  CBC shows leukocytosis of 15.4.  Otherwise unremarkable.  CMP shows hyperglycemia of 251.  No priors for comparison.     Reevaluation after analgesics.  Vital signs improved.  Patient is afebrile.  I personally irrigated the wound with sterile saline.  Full expiration of the wound showed that it was not deep but was rather very superficial.  Approximately 1 to 2 mm of a sterile Q-tip had could be inserted in the wound but no further.  The warmth, erythema was demarcated with a skin marker.  I discussed treatment options with patient.  We discussed both admission versus outpatient therapy.  Patient with no known history of diabetes but does have high extensive hyperglycemia here in the ED.  His vital signs are otherwise stable.  His warmth, erythema does not extend over joint line and he is able to easily flex and extend his foot without any difficulty.  He has no indications for septic arthritis at this time.  Additionally, his x-rays negative for any acute abnormality.  His wound is explored and does not show that is very deep.  We discussed risk first benefits of both options.  Patient states that he would rather try outpatient therapy first rather than be admitted.  I offered to call hospitalist for admission but patient states that he would rather try outpatient therapy.  At this time, given his overall well appearance, I feel that this is reasonable.  Patient instructed to take antibiotic as directed.  He does not have a primary care doctor.  We [facility] as well as clinic here in town for primary care establishment.  Additionally, will plan to cover patient for both Pseudomonas, staph. Patient reviewed on PMP. He has no recent narcotic prescriptions. His last one was in 2017. He does have a history of narcotic abuse, but at this time, he does have a wound that appaer to be infected and causing significant pain. Will give short course of pain medication. Patient instructed to closely monitor symptoms and if any of the warmth, erythema extends beyond the marked line, to return the  emergency department immediately. Patient had ample opportunity for questions and discussion. All patient's questions were answered with full understanding. Strict return precautions discussed. Patient expresses understanding and agreement to plan.    Final Clinical Impressions(s) / ED Diagnoses   Final diagnoses:  Cellulitis  of right lower extremity  Puncture wound of right foot, initial encounter    ED Discharge Orders        Ordered    ciprofloxacin (CIPRO) 500 MG tablet  2 times daily     05/25/18 0105    clindamycin (CLEOCIN) 150 MG capsule  Every 6 hours     05/25/18 0105    HYDROcodone-acetaminophen (NORCO/VICODIN) 5-325 MG tablet  Every 6 hours PRN     05/25/18 0105       Maxwell Caul, PA-C 05/25/18 0121    Maxwell Caul, PA-C 05/25/18 0124    Eber Hong, MD 05/30/18 2104

## 2018-05-24 NOTE — ED Triage Notes (Signed)
Pt states that found a nail in the bottom of his foot yesterday, swelling and redness has increased today,

## 2018-05-25 LAB — COMPREHENSIVE METABOLIC PANEL
ALT: 22 U/L (ref 0–44)
AST: 15 U/L (ref 15–41)
Albumin: 3.9 g/dL (ref 3.5–5.0)
Alkaline Phosphatase: 86 U/L (ref 38–126)
Anion gap: 9 (ref 5–15)
BILIRUBIN TOTAL: 0.5 mg/dL (ref 0.3–1.2)
BUN: 10 mg/dL (ref 6–20)
CHLORIDE: 101 mmol/L (ref 98–111)
CO2: 27 mmol/L (ref 22–32)
CREATININE: 0.81 mg/dL (ref 0.61–1.24)
Calcium: 9.1 mg/dL (ref 8.9–10.3)
Glucose, Bld: 251 mg/dL — ABNORMAL HIGH (ref 70–99)
POTASSIUM: 3.7 mmol/L (ref 3.5–5.1)
Sodium: 137 mmol/L (ref 135–145)
TOTAL PROTEIN: 7.8 g/dL (ref 6.5–8.1)

## 2018-05-25 MED ORDER — HYDROCODONE-ACETAMINOPHEN 5-325 MG PO TABS: 1.0000 | ORAL_TABLET | Freq: Four times a day (QID) | ORAL | 0 refills | Status: DC | PRN

## 2018-05-25 MED ORDER — CLINDAMYCIN HCL 150 MG PO CAPS: 450.0000 mg | ORAL_CAPSULE | Freq: Four times a day (QID) | ORAL | 0 refills | Status: AC

## 2018-05-25 MED ORDER — PIPERACILLIN-TAZOBACTAM 3.375 G IVPB 30 MIN
3.3750 g | Freq: Once | INTRAVENOUS | Status: AC
  Administered 2018-05-25: 3.375 g via INTRAVENOUS
  Filled 2018-05-25: qty 50

## 2018-05-25 MED ORDER — CIPROFLOXACIN HCL 500 MG PO TABS: 500.0000 mg | ORAL_TABLET | Freq: Two times a day (BID) | ORAL | 0 refills | Status: AC

## 2018-05-25 NOTE — Discharge Instructions (Signed)
Keep the wound clean and dry.  Make sure you are changing the dressing 2 times a day.  Make sure that the wound is completely dry before putting any dressing.  You can take 1000 mg of Tylenol.  Do not exceed 4000 mg of Tylenol a day.  You can take pain medication for severe or breakthrough pain.   Take antibiotics as directed. Please take all of your antibiotics until finished.  As we discussed, it is very important for you to get follow-up with a primary care doctor.  I provided some options in the paperwork.  Your blood sugar was high here today in the emergency department.  He needs to be reevaluated for evaluation of possible diabetes.  Please follow-up with 1 of these doctors.  As we discussed, closely monitor your symptoms.  If you the redness, swelling, warmth extends out side of the border today or you develop fever, worsening pain or any other worsening conditions, please return to the emergency department immediately.

## 2018-05-28 ENCOUNTER — Encounter (HOSPITAL_COMMUNITY): Payer: Self-pay | Admitting: *Deleted

## 2018-05-28 ENCOUNTER — Emergency Department (HOSPITAL_COMMUNITY)
Admission: EM | Admit: 2018-05-28 | Discharge: 2018-05-29 | Disposition: A | Discharge status: Home / Self Care | Payer: Self-pay | Attending: Emergency Medicine | Admitting: Emergency Medicine

## 2018-05-28 ENCOUNTER — Other Ambulatory Visit: Payer: Self-pay

## 2018-05-28 DIAGNOSIS — R2241 Localized swelling, mass and lump, right lower limb: Secondary | ICD-10-CM | POA: Insufficient documentation

## 2018-05-28 DIAGNOSIS — L03115 Cellulitis of right lower limb: Secondary | ICD-10-CM | POA: Insufficient documentation

## 2018-05-28 DIAGNOSIS — R739 Hyperglycemia, unspecified: Secondary | ICD-10-CM

## 2018-05-28 DIAGNOSIS — F17210 Nicotine dependence, cigarettes, uncomplicated: Secondary | ICD-10-CM | POA: Insufficient documentation

## 2018-05-28 DIAGNOSIS — Z79899 Other long term (current) drug therapy: Secondary | ICD-10-CM | POA: Insufficient documentation

## 2018-05-28 DIAGNOSIS — M79671 Pain in right foot: Secondary | ICD-10-CM | POA: Insufficient documentation

## 2018-05-28 NOTE — ED Provider Notes (Signed)
[facility] EMERGENCY DEPARTMENT Provider Note   CSN: [id number] Arrival date & time: 05/28/18  2009     History   Chief Complaint Chief Complaint  Patient presents with  . Wound Check    HPI Steven Barnes is a 47 y.o. male.  Patient is a 47 year old male presenting with complaints of right foot pain and swelling.  5 days ago, he found a nail head gotten into his work boot and poked into the bottom of his foot.  He removed the nail, then developed redness and drainage from the puncture site.  He was seen here the following day and prescribed Cipro and clindamycin.  He has been taking this for 3 days, how the redness persists and he is having increased swelling into his ankle and lower leg.  He denies any fevers or chills.  The history is provided by the patient.  Wound Check  This is a new problem. Episode onset: 5 days ago. The problem occurs constantly. The problem has been gradually worsening. The symptoms are aggravated by walking. Nothing relieves the symptoms. He has tried nothing for the symptoms.    Past Medical History:  Diagnosis Date  . Chronic knee pain   . Knee arthropathy   . Narcotic abuse (HCC)     There are no active problems to display for this patient.   Past Surgical History:  Procedure Laterality Date  . KNEE ARTHROSCOPY AND ARTHROTOMY    . KNEE SURGERY          Home Medications    Prior to Admission medications   Medication Sig Start Date End Date Taking? Authorizing Provider  ciprofloxacin (CIPRO) 500 MG tablet Take 1 tablet (500 mg total) by mouth 2 (two) times daily for 7 days. 05/25/18 06/01/18  Maxwell CaulLayden, Lindsey A, PA-C  clindamycin (CLEOCIN) 150 MG capsule Take 3 capsules (450 mg total) by mouth every 6 (six) hours for 7 days. 05/25/18 06/01/18  Maxwell CaulLayden, Lindsey A, PA-C  HYDROcodone-acetaminophen (NORCO/VICODIN) 5-325 MG tablet Take 1-2 tablets by mouth every 6 (six) hours as needed. 05/25/18   Maxwell CaulLayden, Lindsey A, PA-C  loratadine (CLARITIN) 10 MG  tablet Take 10 mg by mouth at bedtime.    [provider]  Multiple Vitamin (MULTIVITAMIN WITH MINERALS) TABS tablet Take 1 tablet by mouth at bedtime.    [provider]    Family History Family History  Problem Relation Age of Onset  . Hypertension Father     Social History Social History   Tobacco Use  . Smoking status: Current Every Day Smoker    Packs/day: 1.00    Years: 15.00    Pack years: 15.00    Types: Cigarettes  . Smokeless tobacco: Never Used  Substance Use Topics  . Alcohol use: No  . Drug use: No     Allergies   Hornet venom and Aspirin   Review of Systems Review of Systems  All other systems reviewed and are negative.    Physical Exam Updated Vital Signs BP (!) 154/86 (BP Location: Right Arm)   Pulse 90   Temp 97.8 F (36.6 C) (Oral)   Resp 18   Ht 6' (1.829 m)   Wt 120.2 kg (265 lb)   SpO2 95%   BMI 35.94 kg/m   Physical Exam  Constitutional: He is oriented to person, place, and time. He appears well-developed and well-nourished. No distress.  HENT:  Head: Normocephalic and atraumatic.  Mouth/Throat: Oropharynx is clear and moist.  Neck: Normal range  of motion. Neck supple.  Cardiovascular: Normal rate and regular rhythm. Exam reveals no friction rub.  No murmur heard. Pulmonary/Chest: Effort normal and breath sounds normal. No respiratory distress. He has no wheezes. He has no rales.  Abdominal: Soft. Bowel sounds are normal. He exhibits no distension. There is no tenderness.  Musculoskeletal: Normal range of motion. He exhibits no edema.  The plantar surface of the right foot is noted to have a puncture just proximal to the third phalanx.  There is erythema extending to the dorsum of the foot and swelling which extends beyond the ankle and into the lower leg.  DP pulses are palpable and capillary refill is brisk.  Neurological: He is alert and oriented to person, place, and time. Coordination normal.  Skin: Skin is  warm and dry. He is not diaphoretic.  Nursing note and vitals reviewed.    ED Treatments / Results  Labs (all labs ordered are listed, but only abnormal results are displayed) Labs Reviewed - No data to display  EKG None  Radiology No results found.  Procedures Procedures (including critical care time)  Medications Ordered in ED Medications - No data to display   Initial Impression / Assessment and Plan / ED Course  I have reviewed the triage vital signs and the nursing notes.  Pertinent labs & imaging results that were available during my care of the patient were reviewed by me and considered in my medical decision making (see chart for details).  Patient with continuing cellulitis in the right foot.  He has been on both Cipro and clindamycin.  He was given IV vancomycin here.  Medicine has been consulted for possible admission.  He was seen by Dr. Sherryll Burger who believes the patient is appropriate for discharge and continued outpatient therapy.  He will be discharged with continued oral antibiotics and follow-up as needed.  We will also prescribe metformin.  To follow-up as an outpatient.  Final Clinical Impressions(s) / ED Diagnoses   Final diagnoses:  None    ED Discharge Orders    None       Geoffery Lyons, MD 05/29/18 [phone]

## 2018-05-28 NOTE — ED Triage Notes (Signed)
Pt was seen in er on 05/24/2018 for cellulitis of right foot after having a nail stuck in bottom of foot, pt reports that he does not feel that the foot is getting any better, continues to be red and swollen,

## 2018-05-29 DIAGNOSIS — E11628 Type 2 diabetes mellitus with other skin complications: Secondary | ICD-10-CM

## 2018-05-29 DIAGNOSIS — L03119 Cellulitis of unspecified part of limb: Secondary | ICD-10-CM

## 2018-05-29 LAB — CBC WITH DIFFERENTIAL/PLATELET
BASOS PCT: 0 %
Basophils Absolute: 0.1 10*3/uL (ref 0.0–0.1)
EOS ABS: 0.5 10*3/uL (ref 0.0–0.7)
Eosinophils Relative: 4 %
HCT: 41.4 % (ref 39.0–52.0)
HEMOGLOBIN: 14.5 g/dL (ref 13.0–17.0)
Lymphocytes Relative: 22 %
Lymphs Abs: 2.7 10*3/uL (ref 0.7–4.0)
MCH: 30.2 pg (ref 26.0–34.0)
MCHC: 35 g/dL (ref 30.0–36.0)
MCV: 86.3 fL (ref 78.0–100.0)
MONO ABS: 1.1 10*3/uL — AB (ref 0.1–1.0)
MONOS PCT: 9 %
NEUTROS PCT: 65 %
Neutro Abs: 8.2 10*3/uL — ABNORMAL HIGH (ref 1.7–7.7)
Platelets: 261 10*3/uL (ref 150–400)
RBC: 4.8 MIL/uL (ref 4.22–5.81)
RDW: 12.9 % (ref 11.5–15.5)
WBC: 12.5 10*3/uL — ABNORMAL HIGH (ref 4.0–10.5)

## 2018-05-29 LAB — BASIC METABOLIC PANEL
Anion gap: 9 (ref 5–15)
BUN: 11 mg/dL (ref 6–20)
CALCIUM: 9 mg/dL (ref 8.9–10.3)
CO2: 26 mmol/L (ref 22–32)
CREATININE: 0.81 mg/dL (ref 0.61–1.24)
Chloride: 100 mmol/L (ref 98–111)
GFR calc non Af Amer: >60 mL/min (ref >60)
Glucose, Bld: 258 mg/dL — ABNORMAL HIGH (ref 70–99)
Potassium: 3.6 mmol/L (ref 3.5–5.1)
Sodium: 135 mmol/L (ref 135–145)

## 2018-05-29 MED ORDER — METFORMIN HCL 500 MG PO TABS: 500.0000 mg | ORAL_TABLET | Freq: Two times a day (BID) | ORAL | 0 refills | Status: DC

## 2018-05-29 MED ORDER — VANCOMYCIN HCL IN DEXTROSE 1-5 GM/200ML-% IV SOLN
1000.0000 mg | Freq: Once | INTRAVENOUS | Status: AC
  Administered 2018-05-29: 1000 mg via INTRAVENOUS
  Filled 2018-05-29: qty 200

## 2018-05-29 NOTE — Progress Notes (Signed)
Patient seen and evaluated in the emergency department.  His cellulitis is actually improving.  Patient's concern is simply some persistent swelling that has been present over the last 2 to 3 days.  I have recommended ice and lower extremity elevation as well as some compression at home.    He continues to take his medications without any problems and is noted to have an improvement in his leukocytosis from 15,000 to12,000 now on laboratory data.  He denies any fevers or chills and states that his pain is improving as well.  I discussed with the patient that I could bring him in for observation, but I did not feel that this was necessary and the patient agrees.  He was seeking reassurance that his cellulitis would improve and I stated that he should go ahead and complete the course of his antibiotics for which he has 3-4 more days left.  I recommend that he continue oral antibiotics at home and return to the ED should his symptoms worsen.  Additionally due to his elevated blood glucose readings and concern for type 2 diabetes I would recommend initiating metformin 500 mg twice daily and provide a list of primary care providers for him to follow-up with.  Finally he requires a work note excuse.  He will also need to purchase a glucometer at a local pharmacy and monitor fasting blood glucose levels at home.

## 2018-05-29 NOTE — Discharge Instructions (Signed)
Continue antibiotics as previously prescribed.  Begin taking metformin as prescribed today.  Follow-up with a primary doctor to have your blood sugars checked.

## 2018-06-18 ENCOUNTER — Emergency Department (HOSPITAL_COMMUNITY): Payer: Self-pay

## 2018-06-18 ENCOUNTER — Emergency Department (HOSPITAL_COMMUNITY)
Admission: EM | Admit: 2018-06-18 | Discharge: 2018-06-19 | Disposition: A | Discharge status: Home / Self Care | Payer: Self-pay | Attending: Emergency Medicine | Admitting: Emergency Medicine

## 2018-06-18 ENCOUNTER — Encounter (HOSPITAL_COMMUNITY): Payer: Self-pay | Admitting: Emergency Medicine

## 2018-06-18 ENCOUNTER — Other Ambulatory Visit: Payer: Self-pay

## 2018-06-18 DIAGNOSIS — L03115 Cellulitis of right lower limb: Secondary | ICD-10-CM | POA: Insufficient documentation

## 2018-06-18 DIAGNOSIS — E119 Type 2 diabetes mellitus without complications: Secondary | ICD-10-CM | POA: Insufficient documentation

## 2018-06-18 DIAGNOSIS — Z7984 Long term (current) use of oral hypoglycemic drugs: Secondary | ICD-10-CM | POA: Insufficient documentation

## 2018-06-18 DIAGNOSIS — F17210 Nicotine dependence, cigarettes, uncomplicated: Secondary | ICD-10-CM | POA: Insufficient documentation

## 2018-06-18 LAB — CBC WITH DIFFERENTIAL/PLATELET
BASOS PCT: 0 %
Basophils Absolute: 0 10*3/uL (ref 0.0–0.1)
Eosinophils Absolute: 0.1 10*3/uL (ref 0.0–0.7)
Eosinophils Relative: 1 %
HEMATOCRIT: 39.6 % (ref 39.0–52.0)
Hemoglobin: 13.5 g/dL (ref 13.0–17.0)
Lymphocytes Relative: 17 %
Lymphs Abs: 1.7 10*3/uL (ref 0.7–4.0)
MCH: 29.8 pg (ref 26.0–34.0)
MCHC: 34.1 g/dL (ref 30.0–36.0)
MCV: 87.4 fL (ref 78.0–100.0)
MONO ABS: 0.8 10*3/uL (ref 0.1–1.0)
Monocytes Relative: 7 %
NEUTROS ABS: 7.7 10*3/uL (ref 1.7–7.7)
NEUTROS PCT: 74 %
Platelets: 233 10*3/uL (ref 150–400)
RBC: 4.53 MIL/uL (ref 4.22–5.81)
RDW: 13.2 % (ref 11.5–15.5)
WBC: 10.3 10*3/uL (ref 4.0–10.5)

## 2018-06-18 LAB — URINALYSIS, ROUTINE W REFLEX MICROSCOPIC
Bilirubin Urine: NEGATIVE
Glucose, UA: NEGATIVE mg/dL
Hgb urine dipstick: NEGATIVE
KETONES UR: NEGATIVE mg/dL
LEUKOCYTES UA: NEGATIVE
NITRITE: NEGATIVE
PH: 6 (ref 5.0–8.0)
PROTEIN: NEGATIVE mg/dL
Specific Gravity, Urine: 1.014 (ref 1.005–1.030)

## 2018-06-18 LAB — COMPREHENSIVE METABOLIC PANEL
ALBUMIN: 3.9 g/dL (ref 3.5–5.0)
ALT: 25 U/L (ref 0–44)
ANION GAP: 8 (ref 5–15)
AST: 20 U/L (ref 15–41)
Alkaline Phosphatase: 57 U/L (ref 38–126)
BILIRUBIN TOTAL: 0.6 mg/dL (ref 0.3–1.2)
BUN: 15 mg/dL (ref 6–20)
CO2: 27 mmol/L (ref 22–32)
Calcium: 8.9 mg/dL (ref 8.9–10.3)
Chloride: 105 mmol/L (ref 98–111)
Creatinine, Ser: 0.96 mg/dL (ref 0.61–1.24)
GFR calc Af Amer: >60 mL/min (ref >60)
GFR calc non Af Amer: >60 mL/min (ref >60)
GLUCOSE: 140 mg/dL — AB (ref 70–99)
POTASSIUM: 3.7 mmol/L (ref 3.5–5.1)
SODIUM: 140 mmol/L (ref 135–145)
TOTAL PROTEIN: 7.3 g/dL (ref 6.5–8.1)

## 2018-06-18 LAB — CBG MONITORING, ED: GLUCOSE-CAPILLARY: 145 mg/dL — AB (ref 70–99)

## 2018-06-18 MED ORDER — CEFAZOLIN SODIUM-DEXTROSE 1-4 GM/50ML-% IV SOLN
1.0000 g | Freq: Once | INTRAVENOUS | Status: AC
  Administered 2018-06-18: 1 g via INTRAVENOUS
  Filled 2018-06-18: qty 50

## 2018-06-18 MED ORDER — DOXYCYCLINE HYCLATE 100 MG PO TABS
100.0000 mg | ORAL_TABLET | Freq: Once | ORAL | Status: AC
  Administered 2018-06-18: 100 mg via ORAL
  Filled 2018-06-18: qty 1

## 2018-06-18 NOTE — ED Triage Notes (Addendum)
Pt c/o of right foot swelling and redness with cracking underneath the toes, pt reports he is diabetic , has previously been treated here for same, pt states began after a puncture wound from a nail in July 2019, CBG in triage: 145

## 2018-06-18 NOTE — ED Provider Notes (Signed)
[facility] EMERGENCY DEPARTMENT Provider Note   CSN: [id number] Arrival date & time: 06/18/18  2111     History   Chief Complaint Chief Complaint  Patient presents with  . Foot Injury    HPI Steven Barnes is a 47 y.o. male.  Patient is a 47 year old male who presents to the emergency department with swelling and redness of his right foot.  Patient states this problem started on July 17 when he found that he possibly stepped on a nail.  He was also found to be a new diabetic.  He was started on metformin.  He was placed on 2 different antibiotics.  The patient continued to have issues with cellulitis approximately a week later and had his antibiotics rearranged.  He states that it gradually got some better, but in the last 3 or 4 days it seems as though the problem is getting worse.  He has swelling of his toes and the dorsum of his foot.  He has some increased warmth in his foot.  And he says there is some redness behind the second and third toe.  There is been no new injury reported.  The patient has not had nausea or vomiting.  He says he has been monitoring his glucose recently and it seems to be coming down from where it was on July 17.  He has not been able to arrange for a primary care physician, but he is still calling different offices and attempting to find an office that we will give him an appointment.  No high fevers reported.  No drainage from the initial puncture site.  The history is provided by the patient.  Foot Injury      Past Medical History:  Diagnosis Date  . Chronic knee pain   . Knee arthropathy   . Narcotic abuse (HCC)     There are no active problems to display for this patient.   Past Surgical History:  Procedure Laterality Date  . KNEE ARTHROSCOPY AND ARTHROTOMY    . KNEE SURGERY          Home Medications    Prior to Admission medications   Medication Sig Start Date End Date Taking? Authorizing Provider  loratadine (CLARITIN) 10 MG tablet  Take 10 mg by mouth at bedtime.   Yes [provider]  metFORMIN (GLUCOPHAGE) 500 MG tablet Take 1 tablet (500 mg total) by mouth 2 (two) times daily with a meal. 05/29/18  Yes Delo, Riley Lamouglas, MD  Multiple Vitamin (MULTIVITAMIN WITH MINERALS) TABS tablet Take 1 tablet by mouth at bedtime.   Yes [provider]  HYDROcodone-acetaminophen (NORCO/VICODIN) 5-325 MG tablet Take 1-2 tablets by mouth every 6 (six) hours as needed. 05/25/18   Maxwell CaulLayden, Lindsey A, PA-C    Family History Family History  Problem Relation Age of Onset  . Hypertension Father     Social History Social History   Tobacco Use  . Smoking status: Current Every Day Smoker    Packs/day: 1.00    Years: 15.00    Pack years: 15.00    Types: Cigarettes  . Smokeless tobacco: Never Used  Substance Use Topics  . Alcohol use: No  . Drug use: No     Allergies   Hornet venom and Aspirin   Review of Systems Review of Systems  Musculoskeletal:       Foot pain  Skin: Positive for wound.       Puncture wound to the right foot  Physical Exam Updated Vital Signs BP (!) 141/100 (BP Location: Right Arm)   Pulse (!) 105   Temp 98.4 F (36.9 C) (Oral)   Resp 16   Ht 6' (1.829 m)   Wt 122.5 kg   SpO2 96%   BMI 36.62 kg/m   Physical Exam  Constitutional: He is oriented to person, place, and time. He appears well-developed and well-nourished.  Non-toxic appearance.  HENT:  Head: Normocephalic.  Right Ear: Tympanic membrane and external ear normal.  Left Ear: Tympanic membrane and external ear normal.  Eyes: Pupils are equal, round, and reactive to light. EOM and lids are normal.  Neck: Normal range of motion. Neck supple. Carotid bruit is not present.  Cardiovascular: Regular rhythm, normal heart sounds, intact distal pulses and normal pulses. Tachycardia present.  Pulmonary/Chest: Breath sounds normal. No respiratory distress.  Abdominal: Soft. Bowel sounds are normal. There is no tenderness.  There is no guarding.  Musculoskeletal: Normal range of motion.  There is swelling of the dorsum of the foot.  There is some increased warmth of the dorsum of the foot behind the second and third toes.  There is some increased redness just behind the second and third toes.  The dorsalis pedis pulses 2+.  In the capillary refill is less than 2 seconds.  There is a healing puncture wound at the plantar surface with minimal drainage present.  No red streaks noted.   Lymphadenopathy:       Head (right side): No submandibular adenopathy present.       Head (left side): No submandibular adenopathy present.    He has no cervical adenopathy.  Neurological: He is alert and oriented to person, place, and time. He has normal strength. No cranial nerve deficit or sensory deficit.  Skin: Skin is warm and dry.  Psychiatric: He has a normal mood and affect. His speech is normal.  Nursing note and vitals reviewed.    ED Treatments / Results  Labs (all labs ordered are listed, but only abnormal results are displayed) Labs Reviewed  COMPREHENSIVE METABOLIC PANEL - Abnormal; Notable for the following components:      Result Value   Glucose, Bld 140 (*)    All other components within normal limits  CBG MONITORING, ED - Abnormal; Notable for the following components:   Glucose-Capillary 145 (*)    All other components within normal limits  CBC WITH DIFFERENTIAL/PLATELET  URINALYSIS, ROUTINE W REFLEX MICROSCOPIC    EKG None  Radiology Dg Foot Complete Right  Result Date: 06/18/2018 CLINICAL DATA:  Infected puncture wound to the right foot with swelling and redness underneath the toes. History of diabetes. Puncture wound from a nail in July 2019. EXAM: RIGHT FOOT COMPLETE - 3+ VIEW COMPARISON:  05/24/2018 FINDINGS: Degenerative changes in the right foot mostly involving the first metatarsal-phalangeal and tarsometatarsal joints as well as the intertarsal joints. Prominent osteophyte formation in the  intertarsal joints. No bone changes that would suggest osteomyelitis. No radiopaque soft tissue foreign bodies or soft tissue gas identified. No focal bone lesion or bone destruction. IMPRESSION: Degenerative changes in the right foot. No acute bony abnormalities. No radiopaque soft tissue foreign bodies. Electronically Signed   By: Burman Nieves M.D.   On: 06/18/2018 22:55    Procedures Procedures (including critical care time)  Medications Ordered in ED Medications  ceFAZolin (ANCEF) IVPB 1 g/50 mL premix (0 g Intravenous Stopped 06/18/18 2335)  doxycycline (VIBRA-TABS) tablet 100 mg (100 mg Oral Given 06/18/18 2307)  Initial Impression / Assessment and Plan / ED Course  I have reviewed the triage vital signs and the nursing notes.  Pertinent labs & imaging results that were available during my care of the patient were reviewed by me and considered in my medical decision making (see chart for details).       Final Clinical Impressions(s) / ED Diagnoses MDM  The initial vital signs showed a tachycardia present.  After the patient had been in the emergency department for a while this improved greatly.  There is no temperature elevation during this emergency department visit.  The urine analysis was well within normal limits.  The complete blood count was well within normal limits.  The comprehensive metabolic panel showed the electrolytes to be within normal limits, the sugar was slightly elevated at 140, the renal and hepatic functions were all normal. X-ray of the right foot shows degenerative changes in multiple sites with some osteophyte formation.  There is no soft tissue foreign body noted.  And there is no soft tissue gas appreciated.  No bone lesions or bone destruction appreciated. Pt seen with me by Dr Adriana Simas. The examination favors cellulitis of the foot.  The patient was given IV Ancef here in the emergency department.  Prescription for Keflex and doxycycline given to the  patient.  I have asked the patient to do warm Epson salt soaks daily, to dry his feet carefully, and to use clean white socks until this has resolved.  The patient is to change the dressing to the original puncture wound daily.  The patient is going to be calling to see if he can get an appointment this week.  I have asked the patient to return to the emergency department for recheck if he is unsuccessful in securing an appointment concerning his foot during this week.  The patient acknowledges understanding along with his significant other of these instructions.   Final diagnoses:  Cellulitis of right foot    ED Discharge Orders         Ordered    cephALEXin (KEFLEX) 500 MG capsule  4 times daily     06/19/18 0003    doxycycline (VIBRAMYCIN) 100 MG capsule  2 times daily     06/19/18 0003           Ivery Quale, PA-C 06/19/18 1610    Donnetta Hutching, MD 06/21/18 [phone]

## 2018-06-19 MED ORDER — DOXYCYCLINE HYCLATE 100 MG PO CAPS: 100.0000 mg | ORAL_CAPSULE | Freq: Two times a day (BID) | ORAL | 0 refills | Status: DC

## 2018-06-19 MED ORDER — CEPHALEXIN 500 MG PO CAPS: 500.0000 mg | ORAL_CAPSULE | Freq: Four times a day (QID) | ORAL | 0 refills | Status: DC

## 2018-06-19 NOTE — Discharge Instructions (Addendum)
Your vital signs are within normal limits.  Your blood sugar is slightly elevated at 146.  Your complete blood count shows your white blood cells to be within normal limits.  The x-ray of your foot shows that there is no infection in the bone, and no gas as a result of infection in the foot.  Your examination however suggest that you have a cellulitis involving your right foot.  Please use warm Epson salt soaks for about 10 to 15 minutes daily.  Please keep your foot elevated above your waist when you are sitting and above your heart when you are lying down to help improve the swelling.  Please use Keflex 4 times daily with breakfast, lunch, dinner, and at bedtime.  Use doxycycline 2 times daily with food.  Please keep your appointment with your primary care physician this week.  Please return to the emergency department if any high fever, excessive drainage from the puncture area of your foot, or signs of advancing cellulitis and infection. It is important that you stop smoking, as smoking hinders the total healing process, especially in a lower extremity.

## 2018-10-06 ENCOUNTER — Emergency Department (HOSPITAL_COMMUNITY)
Admission: EM | Admit: 2018-10-06 | Discharge: 2018-10-06 | Disposition: A | Discharge status: Home / Self Care | Payer: Self-pay | Attending: Emergency Medicine | Admitting: Emergency Medicine

## 2018-10-06 ENCOUNTER — Emergency Department (HOSPITAL_COMMUNITY): Payer: Self-pay

## 2018-10-06 ENCOUNTER — Encounter (HOSPITAL_COMMUNITY): Payer: Self-pay | Admitting: Emergency Medicine

## 2018-10-06 ENCOUNTER — Other Ambulatory Visit: Payer: Self-pay

## 2018-10-06 DIAGNOSIS — L97419 Non-pressure chronic ulcer of right heel and midfoot with unspecified severity: Secondary | ICD-10-CM | POA: Insufficient documentation

## 2018-10-06 DIAGNOSIS — Z7984 Long term (current) use of oral hypoglycemic drugs: Secondary | ICD-10-CM | POA: Insufficient documentation

## 2018-10-06 DIAGNOSIS — E08621 Diabetes mellitus due to underlying condition with foot ulcer: Secondary | ICD-10-CM

## 2018-10-06 DIAGNOSIS — I10 Essential (primary) hypertension: Secondary | ICD-10-CM | POA: Insufficient documentation

## 2018-10-06 DIAGNOSIS — E11621 Type 2 diabetes mellitus with foot ulcer: Secondary | ICD-10-CM | POA: Insufficient documentation

## 2018-10-06 DIAGNOSIS — F17210 Nicotine dependence, cigarettes, uncomplicated: Secondary | ICD-10-CM | POA: Insufficient documentation

## 2018-10-06 DIAGNOSIS — L97418 Non-pressure chronic ulcer of right heel and midfoot with other specified severity: Secondary | ICD-10-CM

## 2018-10-06 DIAGNOSIS — Z79899 Other long term (current) drug therapy: Secondary | ICD-10-CM | POA: Insufficient documentation

## 2018-10-06 HISTORY — DX: Essential (primary) hypertension: I10

## 2018-10-06 HISTORY — DX: Type 2 diabetes mellitus without complications: E11.9

## 2018-10-06 LAB — CBC WITH DIFFERENTIAL/PLATELET
Abs Immature Granulocytes: 0.03 10*3/uL (ref 0.00–0.07)
BASOS PCT: 1 %
Basophils Absolute: 0.1 10*3/uL (ref 0.0–0.1)
EOS ABS: 0.4 10*3/uL (ref 0.0–0.5)
Eosinophils Relative: 4 %
HEMATOCRIT: 44.8 % (ref 39.0–52.0)
Hemoglobin: 14.5 g/dL (ref 13.0–17.0)
Immature Granulocytes: 0 %
LYMPHS ABS: 3.3 10*3/uL (ref 0.7–4.0)
Lymphocytes Relative: 29 %
MCH: 28.6 pg (ref 26.0–34.0)
MCHC: 32.4 g/dL (ref 30.0–36.0)
MCV: 88.4 fL (ref 80.0–100.0)
MONOS PCT: 8 %
Monocytes Absolute: 0.9 10*3/uL (ref 0.1–1.0)
Neutro Abs: 6.7 10*3/uL (ref 1.7–7.7)
Neutrophils Relative %: 58 %
PLATELETS: 200 10*3/uL (ref 150–400)
RBC: 5.07 MIL/uL (ref 4.22–5.81)
RDW: 13.4 % (ref 11.5–15.5)
WBC: 11.4 10*3/uL — ABNORMAL HIGH (ref 4.0–10.5)
nRBC: 0 % (ref 0.0–0.2)

## 2018-10-06 LAB — BASIC METABOLIC PANEL
ANION GAP: 8 (ref 5–15)
BUN: 13 mg/dL (ref 6–20)
CALCIUM: 9.1 mg/dL (ref 8.9–10.3)
CO2: 25 mmol/L (ref 22–32)
Chloride: 104 mmol/L (ref 98–111)
Creatinine, Ser: 1.01 mg/dL (ref 0.61–1.24)
GFR calc Af Amer: >60 mL/min (ref >60)
GLUCOSE: 97 mg/dL (ref 70–99)
POTASSIUM: 4.2 mmol/L (ref 3.5–5.1)
Sodium: 137 mmol/L (ref 135–145)

## 2018-10-06 MED ORDER — DOXYCYCLINE HYCLATE 100 MG PO TABS
100.0000 mg | ORAL_TABLET | Freq: Once | ORAL | Status: AC
  Administered 2018-10-06: 100 mg via ORAL
  Filled 2018-10-06: qty 1

## 2018-10-06 MED ORDER — DOXYCYCLINE HYCLATE 100 MG PO CAPS: 100.0000 mg | ORAL_CAPSULE | Freq: Two times a day (BID) | ORAL | 0 refills | Status: DC

## 2018-10-06 NOTE — Discharge Instructions (Signed)
Please start doxycycline twice a day for the next 10 days to treat this infection in your foot.  You will need to drink plenty of water, follow-up with the podiatrist exactly as prescribed above.  If you should develop increasing pain swelling fever or drainage from the wound come back to the emergency department immediately.

## 2018-10-06 NOTE — ED Provider Notes (Signed)
[facility] EMERGENCY DEPARTMENT Provider Note   CSN: [id number] Arrival date & time: 10/06/18  1412     History   Chief Complaint Chief Complaint  Patient presents with  . Wound Check    HPI Steven Barnes is a 47 y.o. male.  HPI  The patient is a 47 year old male, he has a known diabetic, he has a history of some chronic pain but also has recently suffered an injury to the bottom of his right foot where he had an open wound which was thought to come from stepping on a nail back in July.  He has noticed recently that he is started to have some increased swelling and his wife has noticed that when she changes his dressings there is a larger hole, a foul smell.  He denies any objective swelling of the foot or fevers or vomiting or any other systemic symptoms.  This is gradually worsening, he states there does not much sensation to the bottom of his feet secondary to his diabetes.  He does endorse smoking cigarettes but is trying to stop.  He has not seen a podiatrist, he has followed with the [facility] department but not recently.  He has a follow-up coming up in the next week or so.  Past Medical History:  Diagnosis Date  . Chronic knee pain   . Diabetes mellitus without complication (HCC)   . Hypertension   . Knee arthropathy   . Narcotic abuse (HCC)     There are no active problems to display for this patient.   Past Surgical History:  Procedure Laterality Date  . KNEE ARTHROSCOPY AND ARTHROTOMY    . KNEE SURGERY          Home Medications    Prior to Admission medications   Medication Sig Start Date End Date Taking? Authorizing Provider  lisinopril (PRINIVIL,ZESTRIL) 10 MG tablet Take 10 mg by mouth every morning.   Yes [provider]  loratadine (CLARITIN) 10 MG tablet Take 10 mg by mouth at bedtime.   Yes [provider]  metFORMIN (GLUCOPHAGE-XR) 500 MG 24 hr tablet Take 500-1,000 mg by mouth See admin instructions. 1000mg  in the  morning and 500mg  at bedtime   Yes [provider]  Multiple Vitamin (MULTIVITAMIN WITH MINERALS) TABS tablet Take 1 tablet by mouth at bedtime.   Yes [provider]  simvastatin (ZOCOR) 10 MG tablet Take 10 mg by mouth at bedtime.   Yes [provider]  doxycycline (VIBRAMYCIN) 100 MG capsule Take 1 capsule (100 mg total) by mouth 2 (two) times daily. 10/06/18   Eber Hong, MD    Family History Family History  Problem Relation Age of Onset  . Hypertension Father     Social History Social History   Tobacco Use  . Smoking status: Current Every Day Smoker    Packs/day: 1.00    Years: 15.00    Pack years: 15.00    Types: Cigarettes  . Smokeless tobacco: Never Used  Substance Use Topics  . Alcohol use: No  . Drug use: No     Allergies   Hornet venom and Aspirin   Review of Systems Review of Systems  All other systems reviewed and are negative.    Physical Exam Updated Vital Signs BP (!) 143/82   Pulse 84   Temp 98 F (36.7 C) (Oral)   Resp 18   Ht 1.829 m (6')   Wt 108 kg   SpO2 98%  BMI 32.28 kg/m   Physical Exam  Constitutional: He appears well-developed and well-nourished. No distress.  HENT:  Head: Normocephalic and atraumatic.  Mouth/Throat: Oropharynx is clear and moist. No oropharyngeal exudate.  Eyes: Pupils are equal, round, and reactive to light. Conjunctivae and EOM are normal. Right eye exhibits no discharge. Left eye exhibits no discharge. No scleral icterus.  Neck: Normal range of motion. Neck supple. No JVD present. No thyromegaly present.  Cardiovascular: Normal rate, regular rhythm, normal heart sounds and intact distal pulses. Exam reveals no gallop and no friction rub.  No murmur heard. Pulmonary/Chest: Effort normal and breath sounds normal. No respiratory distress. He has no wheezes. He has no rales.  Abdominal: Soft. Bowel sounds are normal. He exhibits no distension and no mass. There is no tenderness.    Musculoskeletal: Normal range of motion. He exhibits tenderness. He exhibits no edema.  There is mild swelling to the bottom of the right foot, there is no purulent drainage from the open wound on the plantar aspect of the pad of the foot.  There is some black and green necrotic hard tissue but no drainage, no purulence, no surrounding erythema, no tenderness of the rest of the foot, normal range of motion of the ankle.  Lymphadenopathy:    He has no cervical adenopathy.  Neurological: He is alert. Coordination normal.  Decreased sensation of the bilateral feet  Skin: Skin is warm and dry. No rash noted. No erythema.  Wound as above  Psychiatric: He has a normal mood and affect. His behavior is normal.  Nursing note and vitals reviewed.    ED Treatments / Results  Labs (all labs ordered are listed, but only abnormal results are displayed) Labs Reviewed  CBC WITH DIFFERENTIAL/PLATELET - Abnormal; Notable for the following components:      Result Value   WBC 11.4 (*)    All other components within normal limits  BASIC METABOLIC PANEL    EKG None  Radiology Dg Foot Complete Right  Result Date: 10/06/2018 CLINICAL DATA:  16 year old who stepped on a nail with the RIGHT foot in July, 2019, with a persistent wound at the site. Current history of hypertension and diabetes. Subsequent encounter. EXAM: RIGHT FOOT COMPLETE - 3+ VIEW COMPARISON:  06/18/2018, 05/24/2018. FINDINGS: No evidence of acute or subacute fracture or dislocation. Apparent gas within the soft tissues of the plantar surface at the level of the MCP joints or proximal phalanges, difficult to localize on the oblique or AP view. No evidence of osteomyelitis. Subchondral cystic changes involving the base of the proximal phalanx of the great toe, progressive since July, associated with stable mild narrowing of the first MTP joint space. Moderate midfoot degenerative changes are stable. IMPRESSION: 1. Apparent gas in the plantar  soft tissues at the level of the MCP joints or proximal phalanges on the LATERAL image, difficult to localize on the AP image. 2. No radiographic evidence of osteomyelitis. 3. Osteoarthritis. Electronically Signed   By: Hulan Saas M.D.   On: 10/06/2018 16:08    Procedures Procedures (including critical care time)  Medications Ordered in ED Medications - No data to display   Initial Impression / Assessment and Plan / ED Course  I have reviewed the triage vital signs and the nursing notes.  Pertinent labs & imaging results that were available during my care of the patient were reviewed by me and considered in my medical decision making (see chart for details).  Clinical Course as of Oct 06 1637  Fri Oct 06, 2018  1632 The patient's blood work shows a slight leukocytosis of 11,400 with a normal metabolic panel.  We will start with antibiotics   [BM]  1632 The x-ray reveals no signs of osteomyelitis, there is some gas in the tissue at the opening of the ulcer, this is limited to that direct area, doubt necrotizing fasciitis especially given the big clinical picture with essentially no tenderness of the foot and a chronic open wound in a diabetic foot.   [BM]  1633 Started on doxycycline, referred to podiatry, patient agreeable   [BM]    Clinical Course User Index [BM] Eber HongMiller, Miray Mancino, MD    See picture attached, has an open diabetic ulcer.  Slight foul smell but no drainage.  No objective swelling of the foot other than around the wound, no redness or cellulitis, no ascending lymphangitis, no fever, no tachycardia.  Labs pending to evaluate his hyperglycemic state as well as his potential leukocytosis, he will need antibiotics and referral to podiatry.  I counseled both the patient and his spouse on the need for this, when they state they cannot afford it because of no insurance I counseled them on the ability to afford it if they would stop smoking and allocate that money towards the  medical procedure to which they agreed  Plain film imaging of the foot shows no signs of osteomyelitis, patient agreeable to follow-up in the outpatient setting with podiatry, information given, doxycycline started.      Final Clinical Impressions(s) / ED Diagnoses   Final diagnoses:  Diabetic ulcer of right midfoot associated with diabetes mellitus due to underlying condition, with other ulcer severity [facility](HCC)    ED Discharge Orders         Ordered    doxycycline (VIBRAMYCIN) 100 MG capsule  2 times daily     10/06/18 1637           Eber HongMiller, Lindsay Soulliere, MD 10/06/18 1641

## 2018-10-06 NOTE — ED Triage Notes (Addendum)
Pt reports stepped on a nail in July. Pt reports has been seen for same at ED and health department. Pt family reports has been soaking foot and reports scab came off today and reports site is raised, bruised, with odor on bottom of foot. Pt reports was diagnosed with hypertension and diabetes since July.

## 2018-10-10 ENCOUNTER — Other Ambulatory Visit
Admission: RE | Admit: 2018-10-10 | Discharge: 2018-10-10 | Disposition: A | Discharge status: Home / Self Care | Payer: Self-pay | Source: Ambulatory Visit | Attending: Physician Assistant | Admitting: Physician Assistant

## 2018-10-10 ENCOUNTER — Encounter: Payer: Self-pay | Attending: Physician Assistant | Admitting: Physician Assistant

## 2018-10-10 DIAGNOSIS — E11621 Type 2 diabetes mellitus with foot ulcer: Secondary | ICD-10-CM | POA: Insufficient documentation

## 2018-10-10 DIAGNOSIS — I10 Essential (primary) hypertension: Secondary | ICD-10-CM | POA: Insufficient documentation

## 2018-10-10 DIAGNOSIS — Z87891 Personal history of nicotine dependence: Secondary | ICD-10-CM | POA: Insufficient documentation

## 2018-10-10 DIAGNOSIS — L089 Local infection of the skin and subcutaneous tissue, unspecified: Secondary | ICD-10-CM | POA: Insufficient documentation

## 2018-10-10 DIAGNOSIS — L97512 Non-pressure chronic ulcer of other part of right foot with fat layer exposed: Secondary | ICD-10-CM | POA: Insufficient documentation

## 2018-10-14 NOTE — Progress Notes (Signed)
NICLAS, MARKELL ([id number]) Visit Report for 10/10/2018 Allergy List Details Patient Name: Steven Barnes, Steven Barnes. Date of Service: 10/10/2018 1:00 PM Medical Record Number: [id number] Patient Account Number: [id number] Date of Birth/Sex: October 05, 1971 (47 y.o. M) Treating RN: Rema Jasmine Primary Care Charmian Forbis: MUSE, Lorayne Marek Other Clinician: Referring Dmya Long: MUSE, ROCHELLE Treating Azizah Lisle/Extender: STONE III, HOYT Weeks in Treatment: 0 Allergies Active Allergies bees Reaction: edema Severity: Severe Allergy Notes Electronic Signature(s) Signed: 10/10/2018 4:16:42 PM By: Rema Jasmine Entered By: Rema Jasmine on 10/10/2018 13:39:23 Steven Barnes ([id number]) -------------------------------------------------------------------------------- Arrival Information Details Patient Name: Steven Mannan A. Date of Service: 10/10/2018 1:00 PM Medical Record Number: [id number] Patient Account Number: [id number] Date of Birth/Sex: 09-21-1971 (47 y.o. M) Treating RN: Rema Jasmine Primary Care Andrewjames Weirauch: MUSE, ROCHELLE Other Clinician: Referring Tecora Eustache: MUSE, ROCHELLE Treating Safi Culotta/Extender: Linwood Dibbles, HOYT Weeks in Treatment: 0 Visit Information Patient Arrived: Ambulatory Arrival Time: 13:08 Accompanied By: family Transfer Assistance: None Patient Identification Verified: Yes Secondary Verification Process Completed: Yes Electronic Signature(s) Signed: 10/10/2018 4:16:42 PM By: Rema Jasmine Entered By: Rema Jasmine on 10/10/2018 13:27:01 Steven Barnes ([id number]) -------------------------------------------------------------------------------- Clinic Level of Care Assessment Details Patient Name: Steven Barnes. Date of Service: 10/10/2018 1:00 PM Medical Record Number: [id number] Patient Account Number: [id number] Date of Birth/Sex: Jan 31, 1971 (47 y.o. M) Treating RN: Curtis Sites Primary Care Glennis Borger: MUSE, ROCHELLE Other Clinician: Referring Ree Alcalde: MUSE, ROCHELLE Treating Taniqua Issa/Extender: STONE III,  HOYT Weeks in Treatment: 0 Clinic Level of Care Assessment Items TOOL 1 Quantity Score []  - Use when EandM and Procedure is performed on INITIAL visit 0 ASSESSMENTS - Nursing Assessment / Reassessment X - General Physical Exam (combine w/ comprehensive assessment (listed just below) when 1 20 performed on new pt. evals) X- 1 25 Comprehensive Assessment (HX, ROS, Risk Assessments, Wounds Hx, etc.) ASSESSMENTS - Wound and Skin Assessment / Reassessment []  - Dermatologic / Skin Assessment (not related to wound area) 0 ASSESSMENTS - Ostomy and/or Continence Assessment and Care []  - Incontinence Assessment and Management 0 []  - 0 Ostomy Care Assessment and Management (repouching, etc.) PROCESS - Coordination of Care X - Simple Patient / Family Education for ongoing care 1 15 []  - 0 Complex (extensive) Patient / Family Education for ongoing care X- 1 10 Staff obtains Chiropractor, Records, Test Results / Process Orders []  - 0 Staff telephones HHA, Nursing Homes / Clarify orders / etc []  - 0 Routine Transfer to another Facility (non-emergent condition) []  - 0 Routine Hospital Admission (non-emergent condition) X- 1 15 New Admissions / Manufacturing engineer / Ordering NPWT, Apligraf, etc. []  - 0 Emergency Hospital Admission (emergent condition) PROCESS - Special Needs []  - Pediatric / Minor Patient Management 0 []  - 0 Isolation Patient Management []  - 0 Hearing / Language / Visual special needs []  - 0 Assessment of Community assistance (transportation, D/C planning, etc.) []  - 0 Additional assistance / Altered mentation []  - 0 Support Surface(s) Assessment (bed, cushion, seat, etc.) Zeiders, Fardeen A. ([id number]) INTERVENTIONS - Miscellaneous []  - External ear exam 0 []  - 0 Patient Transfer (multiple staff / Nurse, adult / Similar devices) []  - 0 Simple Staple / Suture removal (25 or less) []  - 0 Complex Staple / Suture removal (26 or more) []  - 0 Hypo/Hyperglycemic Management  (do not check if billed separately) X- 1 15 Ankle / Brachial Index (ABI) - do not check if billed separately Has the patient been seen at the hospital within the last three years: Yes Total Score: 100 Level Of Care: New/Established - Level  3 Electronic Signature(s) Signed: 10/10/2018 5:21:38 PM By: Curtis Sitesorthy, Joanna Entered By: Curtis Sitesorthy, Joanna on 10/10/2018 14:39:53 Steven ArchWRAY, [name] ([id number]) -------------------------------------------------------------------------------- Encounter Discharge Information Details Patient Name: Steven MannanWRAY, [name] Date of Service: 10/10/2018 1:00 PM Medical Record Number: [id number] Patient Account Number: [id number] Date of Birth/Sex: 03/11/1971 (47 y.o. M) Treating RN: Arnette NorrisBiell, Kristina Primary Care Shayden Bobier: MUSE, Lorayne MarekOCHELLE Other Clinician: Referring Tiler Brandis: MUSE, ROCHELLE Treating Brylee Mcgreal/Extender: STONE III, HOYT Weeks in Treatment: 0 Encounter Discharge Information Items Post Procedure Vitals Discharge Condition: Stable Temperature (F): 98.0 Ambulatory Status: Ambulatory Pulse (bpm): 71 Discharge Destination: Home Respiratory Rate (breaths/min): 16 Transportation: Private Auto Blood Pressure (mmHg): 130/95 Accompanied By: wife Schedule Follow-up Appointment: Yes Clinical Summary of Care: Electronic Signature(s) Signed: 10/10/2018 5:26:41 PM By: Arnette NorrisBiell, Kristina Entered By: Arnette NorrisBiell, Kristina on 10/10/2018 14:48:26 Steven ArchWRAY, [name] ([id number]) -------------------------------------------------------------------------------- Lower Extremity Assessment Details Patient Name: Steven MannanWRAY, [name] Date of Service: 10/10/2018 1:00 PM Medical Record Number: [id number] Patient Account Number: [id number] Date of Birth/Sex: 07/02/1971 (47 y.o. M) Treating RN: Rema JasmineNg, Wendi Primary Care Tyress Loden: MUSE, ROCHELLE Other Clinician: Referring Rylynn Schoneman: MUSE, ROCHELLE Treating Romelia Bromell/Extender: STONE III, HOYT Weeks in Treatment: 0 Edema Assessment Assessed: [Left: No] [Right:  No] Edema: [Left: No] [Right: No] Calf Left: Right: Point of Measurement: 35 cm From Medial Instep 40 cm 39 cm Ankle Left: Right: Point of Measurement: 12 cm From Medial Instep 22 cm 22 cm Vascular Assessment Claudication: Claudication Assessment [Left:None] [Right:None] Pulses: Dorsalis Pedis Palpable: [Left:Yes] [Right:Yes] Posterior Tibial Extremity colors, hair growth, and conditions: Extremity Color: [Left:Normal] [Right:Normal] Hair Growth on Extremity: [Right:No] Temperature of Extremity: [Left:Warm] [Right:Warm] Capillary Refill: [Left:< 3 seconds] [Right:< 3 seconds] Blood Pressure: Brachial: [Right:120] Dorsalis Pedis: 150 [Left:Dorsalis Pedis: 160] Ankle: Posterior Tibial: 140 [Left:Posterior Tibial: 160 1.25] [Right:1.33] Toe Nail Assessment Left: Right: Thick: Yes Yes Discolored: No No Deformed: No No Improper Length and Hygiene: No No Electronic Signature(s) Signed: 10/10/2018 4:16:42 PM By: Rema JasmineNg, Wendi Entered By: Rema JasmineNg, Wendi on 10/10/2018 13:38:26 Steven ArchWRAY, [name] ([id number]) -------------------------------------------------------------------------------- Multi Wound Chart Details Patient Name: Steven MannanWRAY, [name] Date of Service: 10/10/2018 1:00 PM Medical Record Number: [id number] Patient Account Number: [id number] Date of Birth/Sex: 12/29/1970 (47 y.o. M) Treating RN: Curtis Sitesorthy, Joanna Primary Care Joesphine Schemm: MUSE, ROCHELLE Other Clinician: Referring Mahealani Sulak: MUSE, ROCHELLE Treating Maghen Group/Extender: STONE III, HOYT Weeks in Treatment: 0 Vital Signs Height(in): 72 Pulse(bpm): 71 Weight(lbs): 235 Blood Pressure(mmHg): 130/95 Body Mass Index(BMI): 32 Temperature(F): 98.0 Respiratory Rate 16 (breaths/min): Photos: [1:No Photos] [N/A:N/A] Wound Location: [1:Right Metatarsal head third] [N/A:N/A] Wounding Event: [1:Trauma] [N/A:N/A] Primary Etiology: [1:Diabetic Wound/Ulcer of the N/A Lower Extremity] Secondary Etiology: [1:Trauma, Other]  [N/A:N/A] Comorbid History: [1:Hypertension, Type II Diabetes N/A] Date Acquired: [1:05/24/2018] [N/A:N/A] Weeks of Treatment: [1:0] [N/A:N/A] Wound Status: [1:Open] [N/A:N/A] Measurements L x W x D [1:1.3x0.8x1.1] [N/A:N/A] (cm) Area (cm) : [1:0.817] [N/A:N/A] Volume (cm) : [1:0.898] [N/A:N/A] % Reduction in Area: [1:0.00%] [N/A:N/A] % Reduction in Volume: [1:0.00%] [N/A:N/A] Starting Position 1 [1:9] (o'clock): Ending Position 1 [1:10] (o'clock): Maximum Distance 1 (cm): [1:0.4] Undermining: [1:Yes] [N/A:N/A] Classification: [1:Grade 1] [N/A:N/A] Exudate Amount: [1:Medium] [N/A:N/A] Exudate Type: [1:Serous] [N/A:N/A] Exudate Color: [1:amber] [N/A:N/A] Wound Margin: [1:Distinct, outline attached] [N/A:N/A] Granulation Amount: [1:None Present (0%)] [N/A:N/A] Necrotic Amount: [1:Large (67-100%)] [N/A:N/A] Necrotic Tissue: [1:Eschar] [N/A:N/A] Exposed Structures: [1:Fat Layer (Subcutaneous Tissue) Exposed: Yes Fascia: No Tendon: No Muscle: No Joint: No Bone: No] [N/A:N/A] Periwound Skin Texture: Callus: Yes N/A N/A Excoriation: No Induration: No Crepitus: No Rash: No Scarring: No Periwound Skin Moisture: Maceration: No N/A N/A Dry/Scaly: No Periwound Skin Color:  Atrophie Blanche: No N/A N/A Cyanosis: No Ecchymosis: No Erythema: No Hemosiderin Staining: No Mottled: No Pallor: No Rubor: No Tenderness on Palpation: No N/A N/A Wound Preparation: Ulcer Cleansing: N/A N/A Rinsed/Irrigated with Saline Topical Anesthetic Applied: Other: lidocaine 4% Treatment Notes Electronic Signature(s) Signed: 10/10/2018 5:21:38 PM By: Curtis Sites Entered By: Curtis Sites on 10/10/2018 14:02:40 Steven Barnes ([id number]) -------------------------------------------------------------------------------- Multi-Disciplinary Care Plan Details Patient Name: Steven Mannan A. Date of Service: 10/10/2018 1:00 PM Medical Record Number: [id number] Patient Account Number: [id number] Date  of Birth/Sex: [birth year] (47 y.o. M) Treating RN: Curtis Sites Primary Care Annarae Macnair: MUSE, ROCHELLE Other Clinician: Referring Traevon Meiring: MUSE, ROCHELLE Treating Rosalia Mcavoy/Extender: STONE III, HOYT Weeks in Treatment: 0 Active Inactive Abuse / Safety / Falls / Self Care Management Nursing Diagnoses: Impaired physical mobility Goals: Patient will not experience any injury related to falls Date Initiated: 10/10/2018 Target Resolution Date: 01/13/2019 Goal Status: Active Interventions: Assess fall risk on admission and as needed Notes: Orientation to the Wound Care Program Nursing Diagnoses: Knowledge deficit related to the wound healing center program Goals: Patient/caregiver will verbalize understanding of the Wound Healing Center Program Date Initiated: 10/10/2018 Target Resolution Date: 01/13/2019 Goal Status: Active Interventions: Provide education on orientation to the wound center Notes: Wound/Skin Impairment Nursing Diagnoses: Impaired tissue integrity Goals: Ulcer/skin breakdown will heal within 14 weeks Date Initiated: 10/10/2018 Target Resolution Date: 01/13/2019 Goal Status: Active Interventions: Assess patient/caregiver ability to obtain necessary supplies OLEY, LAHAIE ([id number]) Assess patient/caregiver ability to perform ulcer/skin care regimen upon admission and as needed Assess ulceration(s) every visit Notes: Electronic Signature(s) Signed: 10/10/2018 5:21:38 PM By: Curtis Sites Entered By: Curtis Sites on 10/10/2018 14:02:30 Steven Barnes ([id number]) -------------------------------------------------------------------------------- Pain Assessment Details Patient Name: Steven Mannan A. Date of Service: 10/10/2018 1:00 PM Medical Record Number: [id number] Patient Account Number: [id number] Date of Birth/Sex: October 10, 1971 (47 y.o. M) Treating RN: Rema Jasmine Primary Care Marlyne Totaro: MUSE, ROCHELLE Other Clinician: Referring Jerrika Ledlow: MUSE, ROCHELLE Treating  Brek Reece/Extender: STONE III, HOYT Weeks in Treatment: 0 Active Problems Location of Pain Severity and Description of Pain Patient Has Paino Yes Site Locations Pain Location: Pain in Ulcers Character of Pain Describe the Pain: Throbbing Pain Management and Medication Current Pain Management: Goals for Pain Management 7/10 to wound. enc to see primary PRN. Electronic Signature(s) Signed: 10/10/2018 4:16:42 PM By: Rema Jasmine Entered By: Rema Jasmine on 10/10/2018 13:13:53 Steven Barnes ([id number]) -------------------------------------------------------------------------------- Patient/Caregiver Education Details Patient Name: Steven Mannan A. Date of Service: 10/10/2018 1:00 PM Medical Record Number: [id number] Patient Account Number: [id number] Date of Birth/Gender: 22-Jun-1971 (47 y.o. M) Treating RN: Curtis Sites Primary Care Physician: MUSE, ROCHELLE Other Clinician: Referring Physician: MUSE, ROCHELLE Treating Physician/Extender: Linwood Dibbles, HOYT Weeks in Treatment: 0 Education Assessment Education Provided To: Patient and Caregiver Education Topics Provided Offloading: Handouts: Other: offloading foot Methods: Explain/Verbal Responses: State content correctly Wound/Skin Impairment: Handouts: Other: wound care as ordered Methods: Demonstration, Explain/Verbal Responses: State content correctly Electronic Signature(s) Signed: 10/10/2018 5:26:41 PM By: Arnette Norris Entered By: Arnette Norris on 10/10/2018 14:48:31 Steven Barnes ([id number]) -------------------------------------------------------------------------------- Wound Assessment Details Patient Name: Steven Mannan A. Date of Service: 10/10/2018 1:00 PM Medical Record Number: [id number] Patient Account Number: [id number] Date of Birth/Sex: [birth year] (47 y.o. M) Treating RN: Rema Jasmine Primary Care Swan [place]: MUSE, ROCHELLE Other Clinician: Referring Naidelin Gugliotta: MUSE, ROCHELLE Treating Sydelle Sherfield/Extender: STONE III,  HOYT Weeks in Treatment: 0 Wound Status Wound Number: 1 Primary Etiology: Diabetic Wound/Ulcer of the Lower Extremity Wound Location: Right Metatarsal head third Secondary Trauma, Other Wounding Event:  Trauma Etiology: Date Acquired: 05/24/2018 Wound Status: Open Weeks Of Treatment: 0 Comorbid History: Hypertension, Type II Diabetes Clustered Wound: No Photos Photo Uploaded By: Rema Jasmine on 10/10/2018 14:43:17 Wound Measurements Length: (cm) 1.3 % Reductio Width: (cm) 0.8 % Reductio Depth: (cm) 1.1 Tunneling: Area: (cm) 0.817 Undermini Volume: (cm) 0.898 Starti Ending Maximum n in Area: 0% n in Volume: 0% No ng: Yes ng Position (o'clock): 9 Position (o'clock): 10 Distance: (cm) 0.4 Wound Description Classification: Grade 1 Foul Odor Wound Margin: Distinct, outline attached Slough/Fib Exudate Amount: Medium Exudate Type: Serous Exudate Color: amber After Cleansing: No rino No Wound Bed Granulation Amount: None Present (0%) Exposed Structure Necrotic Amount: Large (67-100%) Fascia Exposed: No Necrotic Quality: Eschar Fat Layer (Subcutaneous Tissue) Exposed: Yes Tendon Exposed: No Muscle Exposed: No Joint Exposed: No Filosa, Hy A. ([id number]) Bone Exposed: No Periwound Skin Texture Texture Color No Abnormalities Noted: No No Abnormalities Noted: No Callus: Yes Atrophie Blanche: No Crepitus: No Cyanosis: No Excoriation: No Ecchymosis: No Induration: No Erythema: No Rash: No Hemosiderin Staining: No Scarring: No Mottled: No Pallor: No Moisture Rubor: No No Abnormalities Noted: No Dry / Scaly: No Maceration: No Wound Preparation Ulcer Cleansing: Rinsed/Irrigated with Saline Topical Anesthetic Applied: Other: lidocaine 4%, Treatment Notes Wound #1 (Right Metatarsal head third) Notes gentamicin to wound bed, silver cell, adhesive telfa border Electronic Signature(s) Signed: 10/10/2018 4:16:42 PM By: Rema Jasmine Signed: 10/12/2018 1:15:36 AM  By: Lenda Kelp PA-C Entered By: Lenda Kelp on 10/10/2018 13:56:48 Steven Barnes ([id number]) -------------------------------------------------------------------------------- Vitals Details Patient Name: Steven Mannan A. Date of Service: 10/10/2018 1:00 PM Medical Record Number: [id number] Patient Account Number: [id number] Date of Birth/Sex: 1971-10-18 (47 y.o. M) Treating RN: Rema Jasmine Primary Care Cassey Hurrell: MUSE, ROCHELLE Other Clinician: Referring Vergia Chea: MUSE, ROCHELLE Treating Zinedine Ellner/Extender: STONE III, HOYT Weeks in Treatment: 0 Vital Signs Time Taken: 13:14 Temperature (F): 98.0 Height (in): 72 Pulse (bpm): 71 Source: Stated Respiratory Rate (breaths/min): 16 Weight (lbs): 235 Blood Pressure (mmHg): 130/95 Body Mass Index (BMI): 31.9 Reference Range: 80 - 120 mg / dl Electronic Signature(s) Signed: 10/10/2018 4:16:42 PM By: Rema Jasmine Entered ByRema Jasmine on 10/10/2018 13:16:21

## 2018-10-14 NOTE — Progress Notes (Signed)
TILMON, WISEHART ([id number]) Visit Report for 10/10/2018 Chief Complaint Document Details Patient Name: Steven Barnes, Steven Barnes. Date of Service: 10/10/2018 1:00 PM Medical Record Number: [id number] Patient Account Number: [id number] Date of Birth/Sex: 1971-04-01 (47 y.o. M) Treating RN: Primary Care Provider: Kizzie Furnish Other Clinician: Referring Provider: MUSE, ROCHELLE Treating Provider/Extender: STONE III, Andera Cranmer Weeks in Treatment: 0 Information Obtained from: Patient Chief Complaint Right plantar foot ulcer Electronic Signature(s) Signed: 10/12/2018 1:15:36 AM By: Lenda Kelp PA-C Entered By: Lenda Kelp on 10/10/2018 13:58:12 Steven Barnes ([id number]) -------------------------------------------------------------------------------- Debridement Details Patient Name: Steven Mannan A. Date of Service: 10/10/2018 1:00 PM Medical Record Number: [id number] Patient Account Number: [id number] Date of Birth/Sex: [birth year] (47 y.o. M) Treating RN: Curtis Sites Primary Care Provider: MUSE, ROCHELLE Other Clinician: Referring Provider: MUSE, ROCHELLE Treating Provider/Extender: STONE III, Sharen Youngren Weeks in Treatment: 0 Debridement Performed for Wound #1 Right Metatarsal head third Assessment: Performed By: Physician STONE III, Neema Barreira E., PA-C Debridement Type: Debridement Severity of Tissue Pre Fat layer exposed Debridement: Level of Consciousness (Pre- Awake and Alert procedure): Pre-procedure Verification/Time Yes - 14:09 Out Taken: Start Time: 14:09 Pain Control: Lidocaine 4% Topical Solution Total Area Debrided (L x W): 1.3 (cm) x 0.8 (cm) = 1.04 (cm) Tissue and other material Viable, Callus, Slough, Subcutaneous, Skin: Dermis , Skin: Epidermis, Slough debrided: Level: Skin/Subcutaneous Tissue Debridement Description: Excisional Instrument: Curette Specimen: Swab, Number of Specimens Taken: 1 Bleeding: Moderate Hemostasis Achieved: Pressure End Time: 14:21 Procedural Pain:  0 Post Procedural Pain: 0 Response to Treatment: Procedure was tolerated well Level of Consciousness Awake and Alert (Post-procedure): Post Debridement Measurements of Total Wound Length: (cm) 1.4 Width: (cm) 1 Depth: (cm) 0.8 Volume: (cm) 0.88 Character of Wound/Ulcer Post Debridement: Improved Severity of Tissue Post Debridement: Fat layer exposed Post Procedure Diagnosis Same as Pre-procedure Electronic Signature(s) Signed: 10/10/2018 5:21:38 PM By: Curtis Sites Signed: 10/12/2018 1:15:36 AM By: Lenda Kelp PA-C Entered By: Curtis Sites on 10/10/2018 14:52:07 Steven Barnes ([id number]) Steven Barnes, Steven Barnes ([id number]) -------------------------------------------------------------------------------- HPI Details Patient Name: Steven Mannan A. Date of Service: 10/10/2018 1:00 PM Medical Record Number: [id number] Patient Account Number: [id number] Date of Birth/Sex: 1970-12-06 (47 y.o. M) Treating RN: Primary Care Provider: MUSE, Lorayne Marek Other Clinician: Referring Provider: MUSE, ROCHELLE Treating Provider/Extender: STONE III, Jahyra Sukup Weeks in Treatment: 0 History of Present Illness HPI Description: 10/10/18 on evaluation today patient presents for evaluation and clinic concerning an issue that he's been having with his right plantar foot at the third metatarsal head secondary to what he believes to be a nail that he stepped on July 2019. This was on the 17th of the month. Since that time is had a lot of issues with this area and he has not been able to get it to heal appropriately. Fortunately there does not appear to be any evidence of infection. Unfortunately though he doesn't have any significant pain due to neuropathy he also does not seem to be healing as appropriately as we would like. There is in fact a significant amount of necrotic tissue in the central portion of the wound this one does have some depth although there is a significant amount of callous buildup surrounding the  wound bed as well all of this is leading to a very hard to heal ulcer which desperately needs some debridement to clear this way and hopefully allow this a better chance to heal. No fevers, chills, nausea, or vomiting noted at this time. Other than this current issue the patient is fairly healthy  he works as an Personnel officer. Electronic Signature(s) Signed: 10/12/2018 1:15:36 AM By: Lenda Kelp PA-C Entered By: Lenda Kelp on 10/12/2018 00:50:21 Steven Barnes ([id number]) -------------------------------------------------------------------------------- Physical Exam Details Patient Name: Steven Mannan A. Date of Service: 10/10/2018 1:00 PM Medical Record Number: [id number] Patient Account Number: [id number] Date of Birth/Sex: [birth year] (47 y.o. M) Treating RN: Primary Care Provider: MUSE, Lorayne Marek Other Clinician: Referring Provider: MUSE, ROCHELLE Treating Provider/Extender: STONE III, Bernese Doffing Weeks in Treatment: 0 Constitutional patient is hypertensive.. pulse regular and within target range for patient.Marland Kitchen respirations regular, non-labored and within target range for patient.Marland Kitchen temperature within target range for patient.. Well-nourished and well-hydrated in no acute distress. Eyes conjunctiva clear no eyelid edema noted. pupils equal round and reactive to light and accommodation. Ears, Nose, Mouth, and Throat no gross abnormality of ear auricles or external auditory canals. normal hearing noted during conversation. mucus membranes moist. Respiratory normal breathing without difficulty. clear to auscultation bilaterally. Cardiovascular regular rate and rhythm with normal S1, S2. 2+ dorsalis pedis/posterior tibialis pulses. no clubbing, cyanosis, significant edema, <3 sec cap refill. Gastrointestinal (GI) soft, non-tender, non-distended, +BS. no ventral hernia noted. Musculoskeletal normal gait and posture. no significant deformity or arthritic changes, no loss or range of motion, no  clubbing. Psychiatric this patient is able to make decisions and demonstrates good insight into disease process. Alert and Oriented x 3. pleasant and cooperative. Notes Patient's wound bed currently shows evidence of good arterial flow into the lower extremity. His ABI was decent at 1.33 with excellent capillary refill less than three seconds. This coupled with excellent pulses leads me to believe that he really has no significant issues at this point with arterial flow. Nonetheless I think that he has a good chance of healing if we can get the wound clean. I did perform an extensive debridement of the patient's plantar foot order to clear away the necrotic material which he tolerated today without complication post debridement the wound bed appears to be doing much better. The patient was very nervous about this procedure and very please with the end result. As was his family member who was present in the office with him today. Electronic Signature(s) Signed: 10/12/2018 1:15:36 AM By: Lenda Kelp PA-C Entered By: Lenda Kelp on 10/12/2018 00:54:42 Steven Barnes ([id number]) -------------------------------------------------------------------------------- Physician Orders Details Patient Name: Steven Mannan A. Date of Service: 10/10/2018 1:00 PM Medical Record Number: [id number] Patient Account Number: [id number] Date of Birth/Sex: [birth year] (47 y.o. M) Treating RN: Curtis Sites Primary Care Provider: MUSE, ROCHELLE Other Clinician: Referring Provider: MUSE, ROCHELLE Treating Provider/Extender: Linwood Dibbles, Mukhtar Shams Weeks in Treatment: 0 Verbal / Phone Orders: No Diagnosis Coding ICD-10 Coding Code Description E11.621 Type 2 diabetes mellitus with foot ulcer L97.512 Non-pressure chronic ulcer of other part of right foot with fat layer exposed Wound Cleansing Wound #1 Right Metatarsal head third o Clean wound with Normal Saline. o Cleanse wound with mild soap and water o May  Shower, gently pat wound dry prior to applying new dressing. Anesthetic (add to Medication List) Wound #1 Right Metatarsal head third o Topical Lidocaine 4% cream applied to wound bed prior to debridement (In Clinic Only). Primary Wound Dressing Wound #1 Right Metatarsal head third o Silver Alginate - over cream o Other: - gentamicin cream on wound bed Secondary Dressing Wound #1 Right Metatarsal head third o [place] Dressing Change Frequency Wound #1 Right Metatarsal head third o Change dressing every day. Follow-up Appointments Wound #1 Right Metatarsal head third o Return  Appointment in 1 week. Off-Loading Wound #1 Right Metatarsal head third o Open toe surgical shoe to: - darco shoe with peg assist Laboratory o Bacteria identified in Wound by Culture (MICRO) oooo LOINC Code: 6462-6 Steven Barnes, Steven Barnes ([id number]) oooo Convenience Name: Wound culture routine Radiology o Magnetic Resonance Imaging (MRI) Patient Medications Allergies: bees Notifications Medication Indication Start End gentamicin 10/10/2018 DOSE topical 0.1 % cream - cream topical applied in a thin film to the wound bed with each dressing change Electronic Signature(s) Signed: 10/10/2018 2:37:59 PM By: Lenda Kelp PA-C Entered By: Lenda Kelp on 10/10/2018 14:37:58 Steven Barnes ([id number]) -------------------------------------------------------------------------------- Problem List Details Patient Name: Steven Mannan A. Date of Service: 10/10/2018 1:00 PM Medical Record Number: [id number] Patient Account Number: [id number] Date of Birth/Sex: November 11, 1970 (47 y.o. M) Treating RN: Primary Care Provider: Kizzie Furnish Other Clinician: Referring Provider: MUSE, ROCHELLE Treating Provider/Extender: STONE III, Keari Miu Weeks in Treatment: 0 Active Problems ICD-10 Evaluated Encounter Code Description Active Date Today Diagnosis E11.621 Type 2 diabetes mellitus with foot ulcer 10/10/2018 No  Yes L97.512 Non-pressure chronic ulcer of other part of right foot with fat 10/10/2018 No Yes layer exposed Inactive Problems Resolved Problems Electronic Signature(s) Signed: 10/12/2018 1:15:36 AM By: Lenda Kelp PA-C Entered By: Lenda Kelp on 10/10/2018 13:57:54 Steven Barnes ([id number]) -------------------------------------------------------------------------------- Progress Note Details Patient Name: Steven Mannan A. Date of Service: 10/10/2018 1:00 PM Medical Record Number: [id number] Patient Account Number: [id number] Date of Birth/Sex: [birth year] (47 y.o. M) Treating RN: Primary Care Provider: MUSE, Lorayne Marek Other Clinician: Referring Provider: MUSE, ROCHELLE Treating Provider/Extender: STONE III, Baer Hinton Weeks in Treatment: 0 Subjective Chief Complaint Information obtained from Patient Right plantar foot ulcer History of Present Illness (HPI) 10/10/18 on evaluation today patient presents for evaluation and clinic concerning an issue that he's been having with his right plantar foot at the third metatarsal head secondary to what he believes to be a nail that he stepped on July 2019. This was on the 17th of the month. Since that time is had a lot of issues with this area and he has not been able to get it to heal appropriately. Fortunately there does not appear to be any evidence of infection. Unfortunately though he doesn't have any significant pain due to neuropathy he also does not seem to be healing as appropriately as we would like. There is in fact a significant amount of necrotic tissue in the central portion of the wound this one does have some depth although there is a significant amount of callous buildup surrounding the wound bed as well all of this is leading to a very hard to heal ulcer which desperately needs some debridement to clear this way and hopefully allow this a better chance to heal. No fevers, chills, nausea, or vomiting noted at this time. Other than this  current issue the patient is fairly healthy he works as an Personnel officer. Wound History Patient reportedly has not tested positive for osteomyelitis. Patient reportedly has not had testing performed to evaluate circulation in the legs. Patient experiences the following problems associated with their wounds: infection. Patient History Information obtained from Patient. Allergies bees (Severity: Severe, Reaction: edema) Family History Diabetes - Mother, Heart Disease - Mother, Hypertension - Mother,Father, Kidney Disease - Father, Lung Disease - Mother, Stroke - Mother, Thyroid Problems - Father, Tuberculosis - Maternal Grandparents, No family history of Cancer, Hereditary Spherocytosis. Social History Former smoker - ended on 11/08/2018, Marital Status - Married, Alcohol Use - Rarely, Drug Use -  No History, Caffeine Use - Daily. Medical History Eyes Denies history of Cataracts, Glaucoma, Optic Neuritis Ear/Nose/Mouth/Throat Denies history of Chronic sinus problems/congestion, Middle ear problems Hematologic/Lymphatic Denies history of Anemia, Hemophilia, Human Immunodeficiency Virus, Lymphedema, Sickle Cell Disease Respiratory Denies history of Aspiration, Asthma, Chronic Obstructive Pulmonary Disease (COPD), Pneumothorax, Sleep Apnea, Tuberculosis Yerian, Rosendo A. ([id number]) Cardiovascular Patient has history of Hypertension Denies history of Angina, Arrhythmia, Congestive Heart Failure, Coronary Artery Disease, Deep Vein Thrombosis, Hypotension, Myocardial Infarction, Peripheral Arterial Disease, Peripheral Venous Disease, Phlebitis, Vasculitis Gastrointestinal Denies history of Cirrhosis , Colitis, Crohn s, Hepatitis A, Hepatitis B, Hepatitis C Endocrine Patient has history of Type II Diabetes Denies history of Type I Diabetes Genitourinary Denies history of End Stage Renal Disease Immunological Denies history of Lupus Erythematosus, Raynaud s, Scleroderma Integumentary  (Skin) Denies history of History of Burn, History of pressure wounds Musculoskeletal Denies history of Gout, Rheumatoid Arthritis, Osteoarthritis, Osteomyelitis Neurologic Denies history of Dementia, Neuropathy, Quadriplegia, Paraplegia, Seizure Disorder Oncologic Denies history of Received Chemotherapy, Received Radiation Psychiatric Denies history of Anorexia/bulimia, Confinement Anxiety Patient is treated with Oral Agents. Blood sugar is tested. Review of Systems (ROS) Constitutional Symptoms (General Health) Denies complaints or symptoms of Fatigue, Fever, Chills, Marked Weight Change. Eyes Complains or has symptoms of Glasses / Contacts. Denies complaints or symptoms of Vision Changes. Ear/Nose/Mouth/Throat Denies complaints or symptoms of Difficult clearing ears, Sinusitis. Hematologic/Lymphatic Denies complaints or symptoms of Bleeding / Clotting Disorders, Human Immunodeficiency Virus. Respiratory Denies complaints or symptoms of Chronic or frequent coughs, Shortness of Breath. Cardiovascular Denies complaints or symptoms of Chest pain, LE edema. Gastrointestinal Denies complaints or symptoms of Frequent diarrhea, Nausea, Vomiting. Endocrine Denies complaints or symptoms of Hepatitis, Thyroid disease, Polydypsia (Excessive Thirst). Genitourinary Denies complaints or symptoms of Kidney failure/ Dialysis, Incontinence/dribbling. Immunological Denies complaints or symptoms of Hives, Itching. Integumentary (Skin) Denies complaints or symptoms of Wounds, Bleeding or bruising tendency, Breakdown, Swelling. Musculoskeletal Denies complaints or symptoms of Muscle Pain, Muscle Weakness. Neurologic Denies complaints or symptoms of Numbness/parasthesias, Focal/Weakness. Psychiatric Denies complaints or symptoms of Anxiety, Claustrophobia. Steven Barnes, Steven Barnes ([id number]) Objective Constitutional patient is hypertensive.. pulse regular and within target range for patient.Marland Kitchen  respirations regular, non-labored and within target range for patient.Marland Kitchen temperature within target range for patient.. Well-nourished and well-hydrated in no acute distress. Vitals Time Taken: 1:14 PM, Height: 72 in, Source: Stated, Weight: 235 lbs, BMI: 31.9, Temperature: 98.0 F, Pulse: 71 bpm, Respiratory Rate: 16 breaths/min, Blood Pressure: 130/95 mmHg. Eyes conjunctiva clear no eyelid edema noted. pupils equal round and reactive to light and accommodation. Ears, Nose, Mouth, and Throat no gross abnormality of ear auricles or external auditory canals. normal hearing noted during conversation. mucus membranes moist. Respiratory normal breathing without difficulty. clear to auscultation bilaterally. Cardiovascular regular rate and rhythm with normal S1, S2. 2+ dorsalis pedis/posterior tibialis pulses. no clubbing, cyanosis, significant edema, Gastrointestinal (GI) soft, non-tender, non-distended, +BS. no ventral hernia noted. Musculoskeletal normal gait and posture. no significant deformity or arthritic changes, no loss or range of motion, no clubbing. Psychiatric this patient is able to make decisions and demonstrates good insight into disease process. Alert and Oriented x 3. pleasant and cooperative. General Notes: Patient's wound bed currently shows evidence of good arterial flow into the lower extremity. His ABI was decent at 1.33 with excellent capillary refill less than three seconds. This coupled with excellent pulses leads me to believe that he really has no significant issues at this point with arterial flow. Nonetheless I think that he has a  good chance of healing if we can get the wound clean. I did perform an extensive debridement of the patient's plantar foot order to clear away the necrotic material which he tolerated today without complication post debridement the wound bed appears to be doing much better. The patient was very nervous about this procedure and very please  with the end result. As was his family member who was present in the office with him today. Integumentary (Hair, Skin) Wound #1 status is Open. Original cause of wound was Trauma. The wound is located on the Right Metatarsal head third. The wound measures 1.3cm length x 0.8cm width x 1.1cm depth; 0.817cm^2 area and 0.898cm^3 volume. There is Fat Layer (Subcutaneous Tissue) Exposed exposed. There is no tunneling noted, however, there is undermining starting at 9:00 and ending at 10:00 with a maximum distance of 0.4cm. There is a medium amount of serous drainage noted. The wound margin is distinct with the outline attached to the wound base. There is no granulation within the wound bed. There is a large (67- 100%) amount of necrotic tissue within the wound bed including Eschar. The periwound skin appearance exhibited: Callus. The periwound skin appearance did not exhibit: Crepitus, Excoriation, Induration, Rash, Scarring, Dry/Scaly, Maceration, Atrophie Blanche, Cyanosis, Ecchymosis, Hemosiderin Staining, Mottled, Pallor, Rubor, Erythema. Steven Barnes, Steven Barnes ([id number]) Assessment Active Problems ICD-10 Type 2 diabetes mellitus with foot ulcer Non-pressure chronic ulcer of other part of right foot with fat layer exposed Procedures Wound #1 Pre-procedure diagnosis of Wound #1 is a Diabetic Wound/Ulcer of the Lower Extremity located on the Right Metatarsal head third .Severity of Tissue Pre Debridement is: Fat layer exposed. There was a Excisional Skin/Subcutaneous Tissue Debridement with a total area of 1.04 sq cm performed by STONE III, Nylen Creque E., PA-C. With the following instrument(s): Curette to remove Viable tissue/material. Material removed includes Callus, Subcutaneous Tissue, Slough, Skin: Dermis, and Skin: Epidermis after achieving pain control using Lidocaine 4% Topical Solution. 1 specimen was taken by a Swab and sent to the lab per facility protocol. A time out was conducted at 14:09, prior  to the start of the procedure. A Moderate amount of bleeding was controlled with Pressure. The procedure was tolerated well with a pain level of 0 throughout and a pain level of 0 following the procedure. Post Debridement Measurements: 1.4cm length x 1cm width x 0.8cm depth; 0.88cm^3 volume. Character of Wound/Ulcer Post Debridement is improved. Severity of Tissue Post Debridement is: Fat layer exposed. Post procedure Diagnosis Wound #1: Same as Pre-Procedure Plan Wound Cleansing: Wound #1 Right Metatarsal head third: Clean wound with Normal Saline. Cleanse wound with mild soap and water May Shower, gently pat wound dry prior to applying new dressing. Anesthetic (add to Medication List): Wound #1 Right Metatarsal head third: Topical Lidocaine 4% cream applied to wound bed prior to debridement (In Clinic Only). Primary Wound Dressing: Wound #1 Right Metatarsal head third: Silver Alginate - over cream Other: - gentamicin cream on wound bed Secondary Dressing: Wound #1 Right Metatarsal head third: [place] Dressing Change Frequency: Wound #1 Right Metatarsal head third: Change dressing every day. Follow-up Appointments: Wound #1 Right Metatarsal head third: Return Appointment in 1 week. Off-Loading: SEYMOUR, PAVLAK ([id number]) Wound #1 Right Metatarsal head third: Open toe surgical shoe to: - darco shoe with peg assist Laboratory ordered were: Wound culture routine Radiology ordered were: Magnetic Resonance Imaging (MRI) The following medication(s) was prescribed: gentamicin topical 0.1 % cream cream topical applied in a thin film to the wound  bed with each dressing change starting 10/10/2018 I'm gonna recommend currently that we initiate the above wound care measures. The patient is in agreement the plan. I did perform a culture today and depending on the results of the culture we will initiate antibiotic therapy as needed. Subsequently I am gonna send in a prescription for the  gentamicin cream as I discussed with him today as well. Lastly I did order an MRI of his foot in order to evaluate for the possibility of deeper infection. If everything checks out okay on MRI I will be planning in the near future to go ahead and place the patient in a total contact cast in order to attend to get this area to heal as quickly as possible. He understands. Please see above for specific wound care orders. We will see patient for re-evaluation in 1 week(s) here in the clinic. If anything worsens or changes patient will contact our office for additional recommendations. Electronic Signature(s) Signed: 10/12/2018 1:15:36 AM By: Lenda KelpStone III, Donya Hitch PA-C Entered By: Lenda KelpStone III, Ladarren Steiner on 10/12/2018 00:55:43 Steven ArchWRAY, [name] ([id number]) -------------------------------------------------------------------------------- ROS/PFSH Details Patient Name: Steven Barnes, [name] Date of Service: 10/10/2018 1:00 PM Medical Record Number: [id number] Patient Account Number: [id number] Date of Birth/Sex: 10/11/1971 (47 y.o. M) Treating RN: Rema JasmineNg, Wendi Primary Care Provider: MUSE, ROCHELLE Other Clinician: Referring Provider: MUSE, ROCHELLE Treating Provider/Extender: STONE III, Sneha Willig Weeks in Treatment: 0 Information Obtained From Patient Wound History Do you currently have one or more open woundso No Have you tested positive for osteomyelitis (bone infection)o No Have you had any tests for circulation on your legso No Have you had other problems associated with your woundso Infection Constitutional Symptoms (General Health) Complaints and Symptoms: Negative for: Fatigue; Fever; Chills; Marked Weight Change Eyes Complaints and Symptoms: Positive for: Glasses / Contacts Negative for: Vision Changes Medical History: Negative for: Cataracts; Glaucoma; Optic Neuritis Ear/Nose/Mouth/Throat Complaints and Symptoms: Negative for: Difficult clearing ears; Sinusitis Medical History: Negative for: Chronic sinus  problems/congestion; Middle ear problems Hematologic/Lymphatic Complaints and Symptoms: Negative for: Bleeding / Clotting Disorders; Human Immunodeficiency Virus Medical History: Negative for: Anemia; Hemophilia; Human Immunodeficiency Virus; Lymphedema; Sickle Cell Disease Respiratory Complaints and Symptoms: Negative for: Chronic or frequent coughs; Shortness of Breath Medical History: Negative for: Aspiration; Asthma; Chronic Obstructive Pulmonary Disease (COPD); Pneumothorax; Sleep Apnea; Tuberculosis Cardiovascular Steven Barnes, [name] ([id number]) Complaints and Symptoms: Negative for: Chest pain; LE edema Medical History: Positive for: Hypertension Negative for: Angina; Arrhythmia; Congestive Heart Failure; Coronary Artery Disease; Deep Vein Thrombosis; Hypotension; Myocardial Infarction; Peripheral Arterial Disease; Peripheral Venous Disease; Phlebitis; Vasculitis Gastrointestinal Complaints and Symptoms: Negative for: Frequent diarrhea; Nausea; Vomiting Medical History: Negative for: Cirrhosis ; Colitis; Crohnos; Hepatitis A; Hepatitis B; Hepatitis C Endocrine Complaints and Symptoms: Negative for: Hepatitis; Thyroid disease; Polydypsia (Excessive Thirst) Medical History: Positive for: Type II Diabetes Negative for: Type I Diabetes Treated with: Oral agents Blood sugar tested every day: Yes Tested : 2/day Genitourinary Complaints and Symptoms: Negative for: Kidney failure/ Dialysis; Incontinence/dribbling Medical History: Negative for: End Stage Renal Disease Immunological Complaints and Symptoms: Negative for: Hives; Itching Medical History: Negative for: Lupus Erythematosus; Raynaudos; Scleroderma Integumentary (Skin) Complaints and Symptoms: Negative for: Wounds; Bleeding or bruising tendency; Breakdown; Swelling Medical History: Negative for: History of Burn; History of pressure wounds Musculoskeletal Complaints and Symptoms: Negative for: Muscle Pain; Muscle  Weakness Medical History: Negative for: Gout; Rheumatoid Arthritis; Osteoarthritis; Osteomyelitis Steven Barnes, [name] ([id number]) Neurologic Complaints and Symptoms: Negative for: Numbness/parasthesias; Focal/Weakness Medical History: Negative for: Dementia; Neuropathy; Quadriplegia; Paraplegia;  Seizure Disorder Psychiatric Complaints and Symptoms: Negative for: Anxiety; Claustrophobia Medical History: Negative for: Anorexia/bulimia; Confinement Anxiety Oncologic Medical History: Negative for: Received Chemotherapy; Received Radiation Immunizations Pneumococcal Vaccine: Received Pneumococcal Vaccination: No Implantable Devices Family and Social History Cancer: No; Diabetes: Yes - Mother; Heart Disease: Yes - Mother; Hereditary Spherocytosis: No; Hypertension: Yes - Mother,Father; Kidney Disease: Yes - Father; Lung Disease: Yes - Mother; Stroke: Yes - Mother; Thyroid Problems: Yes - Father; Tuberculosis: Yes - Maternal Grandparents; Former smoker - ended on 11/08/2018; Marital Status - Married; Alcohol Use: Rarely; Drug Use: No History; Caffeine Use: Daily; Financial Concerns: Yes; Food, Clothing or Shelter Needs: No; Support System Lacking: No; Transportation Concerns: No; Advanced Directives: No; Patient wants information on Advanced Directives; Advanced Directives information was provided to patient; Do not resuscitate: No; Living Will: No; Medical Power of Attorney: No Electronic Signature(s) Signed: 10/10/2018 4:16:42 PM By: Rema Jasmine Signed: 10/12/2018 1:15:36 AM By: Lenda Kelp PA-C Entered By: Rema Jasmine on 10/10/2018 13:47:53 Steven Barnes ([id number]) -------------------------------------------------------------------------------- SuperBill Details Patient Name: Steven Mannan A. Date of Service: 10/10/2018 Medical Record Number: [id number] Patient Account Number: [id number] Date of Birth/Sex: 08-20-1971 (47 y.o. M) Treating RN: Primary Care Provider: MUSE, Lorayne Marek Other  Clinician: Referring Provider: MUSE, ROCHELLE Treating Provider/Extender: STONE III, Shogo Larkey Weeks in Treatment: 0 Diagnosis Coding ICD-10 Codes Code Description E11.621 Type 2 diabetes mellitus with foot ulcer L97.512 Non-pressure chronic ulcer of other part of right foot with fat layer exposed Facility Procedures CPT4 Code: 78295621 Description: 99213 - WOUND CARE VISIT-LEV 3 EST PT Modifier: Quantity: 1 CPT4 Code: 30865784 Description: 11042 - DEB SUBQ TISSUE 20 SQ CM/< ICD-10 Diagnosis Description L97.512 Non-pressure chronic ulcer of other part of right foot with fat Modifier: layer exposed Quantity: 1 Physician Procedures CPT4 Code: 6962952 Description: 99204 - WC PHYS LEVEL 4 - NEW PT ICD-10 Diagnosis Description E11.621 Type 2 diabetes mellitus with foot ulcer L97.512 Non-pressure chronic ulcer of other part of right foot with fat Modifier: 25 layer exposed Quantity: 1 CPT4 Code: 8413244 Description: 11042 - WC PHYS SUBQ TISS 20 SQ CM ICD-10 Diagnosis Description L97.512 Non-pressure chronic ulcer of other part of right foot with fat Modifier: layer exposed Quantity: 1 Electronic Signature(s) Signed: 10/12/2018 1:15:36 AM By: Lenda Kelp PA-C Entered By: Lenda Kelp on 10/12/2018 00:55:56

## 2018-10-15 LAB — AEROBIC/ANAEROBIC CULTURE W GRAM STAIN (SURGICAL/DEEP WOUND)

## 2018-10-15 LAB — AEROBIC/ANAEROBIC CULTURE (SURGICAL/DEEP WOUND)

## 2018-10-17 ENCOUNTER — Encounter: Payer: Self-pay | Admitting: Physician Assistant

## 2018-10-22 NOTE — Progress Notes (Signed)
ZAYVIAN, MCMURTRY ([id number]) Visit Report for 10/17/2018 Arrival Information Details Patient Name: Steven, Barnes. Date of Service: 10/17/2018 1:45 PM Medical Record Number: [id number] Patient Account Number: [id number] Date of Birth/Sex: Apr 13, 1971 (47 y.o. M) Treating RN: Arnette Norris Primary Care Brinlynn Gorton: MUSE, ROCHELLE Other Clinician: Referring Natalina Wieting: MUSE, ROCHELLE Treating Herminia Warren/Extender: Linwood Dibbles, HOYT Weeks in Treatment: 1 Visit Information History Since Last Visit Added or deleted any medications: No Patient Arrived: Ambulatory Any new allergies or adverse reactions: No Arrival Time: 14:12 Had a fall or experienced change in No Accompanied By: wife activities of daily living that may affect Transfer Assistance: None risk of falls: Patient Identification Verified: Yes Signs or symptoms of abuse/neglect since last visito No Secondary Verification Process Completed: Yes Hospitalized since last visit: No Has Dressing in Place as Prescribed: Yes Pain Present Now: No Electronic Signature(s) Signed: 10/17/2018 5:05:35 PM By: Arnette Norris Entered By: Arnette Norris on 10/17/2018 14:13:30 Steven Arch ([id number]) -------------------------------------------------------------------------------- Encounter Discharge Information Details Patient Name: Steven Mannan A. Date of Service: 10/17/2018 1:45 PM Medical Record Number: [id number] Patient Account Number: [id number] Date of Birth/Sex: 03/05/1971 (47 y.o. M) Treating RN: Curtis Sites Primary Care Geri Hepler: MUSE, ROCHELLE Other Clinician: Referring Analise Glotfelty: MUSE, ROCHELLE Treating Shamira Toutant/Extender: STONE III, HOYT Weeks in Treatment: 1 Encounter Discharge Information Items Post Procedure Vitals Discharge Condition: Stable Temperature (F): 97.8 Ambulatory Status: Ambulatory Pulse (bpm): 69 Discharge Destination: Home Respiratory Rate (breaths/min): 16 Transportation: Private Auto Blood Pressure (mmHg):  143/80 Accompanied By: sister Schedule Follow-up Appointment: Yes Clinical Summary of Care: Electronic Signature(s) Signed: 10/17/2018 5:28:58 PM By: Curtis Sites Entered By: Curtis Sites on 10/17/2018 14:52:59 Steven Arch ([id number]) -------------------------------------------------------------------------------- Lower Extremity Assessment Details Patient Name: Steven Mannan A. Date of Service: 10/17/2018 1:45 PM Medical Record Number: [id number] Patient Account Number: [id number] Date of Birth/Sex: 01/27/1971 (47 y.o. M) Treating RN: Arnette Norris Primary Care Nivea Wojdyla: Kizzie Furnish Other Clinician: Referring Shalese Strahan: MUSE, ROCHELLE Treating Obinna Ehresman/Extender: Linwood Dibbles, HOYT Weeks in Treatment: 1 Electronic Signature(s) Signed: 10/17/2018 5:05:35 PM By: Arnette Norris Entered By: Arnette Norris on 10/17/2018 14:14:17 Steven Arch ([id number]) -------------------------------------------------------------------------------- Multi Wound Chart Details Patient Name: Steven Mannan A. Date of Service: 10/17/2018 1:45 PM Medical Record Number: [id number] Patient Account Number: [id number] Date of Birth/Sex: Feb 11, 1971 (47 y.o. M) Treating RN: Curtis Sites Primary Care Thora Scherman: MUSE, ROCHELLE Other Clinician: Referring Zaynah Chawla: MUSE, ROCHELLE Treating Kaylana Fenstermacher/Extender: STONE III, HOYT Weeks in Treatment: 1 Vital Signs Height(in): 72 Pulse(bpm): 69 Weight(lbs): 235 Blood Pressure(mmHg): 143/80 Body Mass Index(BMI): 32 Temperature(F): 97.8 Respiratory Rate 16 (breaths/min): Photos: [1:No Photos] [N/A:N/A] Wound Location: [1:Right Metatarsal head third] [N/A:N/A] Wounding Event: [1:Trauma] [N/A:N/A] Primary Etiology: [1:Diabetic Wound/Ulcer of the N/A Lower Extremity] Secondary Etiology: [1:Trauma, Other] [N/A:N/A] Comorbid History: [1:Hypertension, Type II Diabetes N/A] Date Acquired: [1:05/24/2018] [N/A:N/A] Weeks of Treatment: [1:1] [N/A:N/A] Wound Status:  [1:Open] [N/A:N/A] Measurements L x W x D [1:0.4x0.3x0.5] [N/A:N/A] (cm) Area (cm) : [1:0.094] [N/A:N/A] Volume (cm) : [1:0.047] [N/A:N/A] % Reduction in Area: [1:88.50%] [N/A:N/A] % Reduction in Volume: [1:94.80%] [N/A:N/A] Classification: [1:Grade 1] [N/A:N/A] Exudate Amount: [1:Medium] [N/A:N/A] Exudate Type: [1:Serous] [N/A:N/A] Exudate Color: [1:amber] [N/A:N/A] Wound Margin: [1:Distinct, outline attached] [N/A:N/A] Granulation Amount: [1:Small (1-33%)] [N/A:N/A] Granulation Quality: [1:Pale] [N/A:N/A] Necrotic Amount: [1:Large (67-100%)] [N/A:N/A] Necrotic Tissue: [1:Eschar] [N/A:N/A] Exposed Structures: [1:Fat Layer (Subcutaneous Tissue) Exposed: Yes Fascia: No Tendon: No Muscle: No Joint: No Bone: No] [N/A:N/A] Epithelialization: [1:None] [N/A:N/A] Periwound Skin Texture: [1:Callus: Yes Excoriation: No Induration: No Crepitus: No] [N/A:N/A] Rash: No Scarring: No Periwound Skin Moisture: Maceration: No N/A  N/A Dry/Scaly: No Periwound Skin Color: Atrophie Blanche: No N/A N/A Cyanosis: No Ecchymosis: No Erythema: No Hemosiderin Staining: No Mottled: No Pallor: No Rubor: No Tenderness on Palpation: No N/A N/A Wound Preparation: Ulcer Cleansing: N/A N/A Rinsed/Irrigated with Saline Topical Anesthetic Applied: Other: lidocaine 4% Treatment Notes Electronic Signature(s) Signed: 10/17/2018 5:28:58 PM By: Curtis Sitesorthy, Joanna Entered By: Curtis Sitesorthy, Joanna on 10/17/2018 14:44:01 Steven Barnes, [name] ([id number]) -------------------------------------------------------------------------------- Multi-Disciplinary Care Plan Details Patient Name: Steven Barnes, Tyrez A. Date of Service: 10/17/2018 1:45 PM Medical Record Number: [id number] Patient Account Number: [id number] Date of Birth/Sex: 06/10/1971 (47 y.o. M) Treating RN: Curtis Sitesorthy, Joanna Primary Care Maryiah Olvey: MUSE, ROCHELLE Other Clinician: Referring Esco Joslyn: MUSE, ROCHELLE Treating Jamiaya Bina/Extender: STONE III, HOYT Weeks in  Treatment: 1 Active Inactive Abuse / Safety / Falls / Self Care Management Nursing Diagnoses: Impaired physical mobility Goals: Patient will not experience any injury related to falls Date Initiated: 10/10/2018 Target Resolution Date: 01/13/2019 Goal Status: Active Interventions: Assess fall risk on admission and as needed Notes: Orientation to the Wound Care Program Nursing Diagnoses: Knowledge deficit related to the wound healing center program Goals: Patient/caregiver will verbalize understanding of the Wound Healing Center Program Date Initiated: 10/10/2018 Target Resolution Date: 01/13/2019 Goal Status: Active Interventions: Provide education on orientation to the wound center Notes: Wound/Skin Impairment Nursing Diagnoses: Impaired tissue integrity Goals: Ulcer/skin breakdown will heal within 14 weeks Date Initiated: 10/10/2018 Target Resolution Date: 01/13/2019 Goal Status: Active Interventions: Assess patient/caregiver ability to obtain necessary supplies Steven Barnes, [name] ([id number]) Assess patient/caregiver ability to perform ulcer/skin care regimen upon admission and as needed Assess ulceration(s) every visit Notes: Electronic Signature(s) Signed: 10/17/2018 5:28:58 PM By: Curtis Sitesorthy, Joanna Entered By: Curtis Sitesorthy, Joanna on 10/17/2018 14:43:55 Steven Barnes, [name] ([id number]) -------------------------------------------------------------------------------- Pain Assessment Details Patient Name: Steven Barnes, [name] Date of Service: 10/17/2018 1:45 PM Medical Record Number: [id number] Patient Account Number: [id number] Date of Birth/Sex: 05/19/1971 (47 y.o. M) Treating RN: Arnette NorrisBiell, Kristina Primary Care Catina Nuss: MUSE, Lorayne MarekOCHELLE Other Clinician: Referring Riordan Walle: MUSE, ROCHELLE Treating Taesean Reth/Extender: STONE III, HOYT Weeks in Treatment: 1 Active Problems Location of Pain Severity and Description of Pain Patient Has Paino No Site Locations Pain Management and Medication Current Pain  Management: Electronic Signature(s) Signed: 10/17/2018 5:05:35 PM By: Arnette NorrisBiell, Kristina Entered By: Arnette NorrisBiell, Kristina on 10/17/2018 14:13:37 Steven Barnes, [name] ([id number]) -------------------------------------------------------------------------------- Patient/Caregiver Education Details Patient Name: Steven Barnes, [name] Date of Service: 10/17/2018 1:45 PM Medical Record Number: [id number] Patient Account Number: [id number] Date of Birth/Gender: 08/12/1971 (47 y.o. M) Treating RN: Curtis Sitesorthy, Joanna Primary Care Physician: MUSE, ROCHELLE Other Clinician: Referring Physician: MUSE, ROCHELLE Treating Physician/Extender: Skeet SimmerSTONE III, HOYT Weeks in Treatment: 1 Education Assessment Education Provided To: Patient and Caregiver Education Topics Provided Wound/Skin Impairment: Handouts: Other: wound care as ordered Methods: Demonstration, Explain/Verbal Responses: State content correctly Electronic Signature(s) Signed: 10/17/2018 5:28:58 PM By: Curtis Sitesorthy, Joanna Entered By: Curtis Sitesorthy, Joanna on 10/17/2018 14:53:12 Steven Barnes, [name] ([id number]) -------------------------------------------------------------------------------- Wound Assessment Details Patient Name: Steven Barnes, [name] Date of Service: 10/17/2018 1:45 PM Medical Record Number: [id number] Patient Account Number: [id number] Date of Birth/Sex: 03/19/1971 (47 y.o. M) Treating RN: Arnette NorrisBiell, Kristina Primary Care Genie Mirabal: MUSE, Lorayne MarekOCHELLE Other Clinician: Referring Kamauri Kathol: MUSE, ROCHELLE Treating Haden Suder/Extender: STONE III, HOYT Weeks in Treatment: 1 Wound Status Wound Number: 1 Primary Etiology: Diabetic Wound/Ulcer of the Lower Extremity Wound Location: Right Metatarsal head third Secondary Trauma, Other Wounding Event: Trauma Etiology: Date Acquired: 05/24/2018 Wound Status: Open Weeks Of Treatment: 1 Comorbid History: Hypertension, Type II Diabetes Clustered Wound: No Wound Measurements Length: (cm) 0.4 Width: (cm) 0.3 Depth: (  cm) 0.5 Area:  (cm) 0.094 Volume: (cm) 0.047 % Reduction in Area: 88.5% % Reduction in Volume: 94.8% Epithelialization: None Tunneling: No Undermining: No Wound Description Classification: Grade 1 Wound Margin: Distinct, outline attached Exudate Amount: Medium Exudate Type: Serous Exudate Color: amber Foul Odor After Cleansing: No Slough/Fibrino No Wound Bed Granulation Amount: Small (1-33%) Exposed Structure Granulation Quality: Pale Fascia Exposed: No Necrotic Amount: Large (67-100%) Fat Layer (Subcutaneous Tissue) Exposed: Yes Necrotic Quality: Eschar Tendon Exposed: No Muscle Exposed: No Joint Exposed: No Bone Exposed: No Periwound Skin Texture Texture Color No Abnormalities Noted: No No Abnormalities Noted: No Callus: Yes Atrophie Blanche: No Crepitus: No Cyanosis: No Excoriation: No Ecchymosis: No Induration: No Erythema: No Rash: No Hemosiderin Staining: No Scarring: No Mottled: No Pallor: No Moisture Rubor: No No Abnormalities Noted: No Dry / Scaly: No Maceration: No Steven Barnes, [name] ([id number]) Wound Preparation Ulcer Cleansing: Rinsed/Irrigated with Saline Topical Anesthetic Applied: Other: lidocaine 4%, Treatment Notes Wound #1 (Right Metatarsal head third) Notes gentamicin to wound bed, silver cell, adhesive telfa border Electronic Signature(s) Signed: 10/17/2018 5:05:35 PM By: Arnette Norris Entered By: Arnette Norris on 10/17/2018 14:20:48 Steven Arch ([id number]) -------------------------------------------------------------------------------- Vitals Details Patient Name: Steven Mannan A. Date of Service: 10/17/2018 1:45 PM Medical Record Number: [id number] Patient Account Number: [id number] Date of Birth/Sex: 24-Mar-1971 (48 y.o. M) Treating RN: Arnette Norris Primary Care Mohammad Granade: MUSE, ROCHELLE Other Clinician: Referring Kallen Delatorre: MUSE, ROCHELLE Treating Chrislyn Seedorf/Extender: STONE III, HOYT Weeks in Treatment: 1 Vital Signs Time Taken:  14:13 Temperature (F): 97.8 Height (in): 72 Pulse (bpm): 69 Weight (lbs): 235 Respiratory Rate (breaths/min): 16 Body Mass Index (BMI): 31.9 Blood Pressure (mmHg): 143/80 Reference Range: 80 - 120 mg / dl Electronic Signature(s) Signed: 10/17/2018 5:05:35 PM By: Arnette Norris Entered By: Arnette Norris on 10/17/2018 14:13:58

## 2018-10-24 ENCOUNTER — Encounter: Payer: Self-pay | Admitting: Physician Assistant

## 2018-10-24 NOTE — Progress Notes (Signed)
CALOB, BASKETTE ([id number]) Visit Report for 10/17/2018 Chief Complaint Document Details Patient Name: Steven Barnes, Steven Barnes. Date of Service: 10/17/2018 1:45 PM Medical Record Number: [id number] Patient Account Number: [id number] Date of Birth/Sex: [birth year] (47 y.o. M) Treating RN: Curtis Sites Primary Care Provider: MUSE, Lorayne Marek Other Clinician: Referring Provider: MUSE, ROCHELLE Treating Provider/Extender: Linwood Dibbles, Tianah Lonardo Weeks in Treatment: 1 Information Obtained from: Patient Chief Complaint Right plantar foot ulcer Electronic Signature(s) Signed: 10/20/2018 8:01:46 PM By: Lenda Kelp PA-C Entered By: Lenda Kelp on 10/17/2018 14:41:06 Pasty Arch ([id number]) -------------------------------------------------------------------------------- Debridement Details Patient Name: Steven Mannan A. Date of Service: 10/17/2018 1:45 PM Medical Record Number: [id number] Patient Account Number: [id number] Date of Birth/Sex: 23-Jan-1971 (47 y.o. M) Treating RN: Curtis Sites Primary Care Provider: MUSE, ROCHELLE Other Clinician: Referring Provider: MUSE, ROCHELLE Treating Provider/Extender: STONE III, Margarite Vessel Weeks in Treatment: 1 Debridement Performed for Wound #1 Right Metatarsal head third Assessment: Performed By: Physician STONE III, Tamyah Cutbirth E., PA-C Debridement Type: Debridement Severity of Tissue Pre Fat layer exposed Debridement: Level of Consciousness (Pre- Awake and Alert procedure): Pre-procedure Verification/Time Yes - 14:45 Out Taken: Start Time: 14:45 Pain Control: Lidocaine 4% Topical Solution Total Area Debrided (L x W): 0.4 (cm) x 0.3 (cm) = 0.12 (cm) Tissue and other material Viable, Non-Viable, Callus, Slough, Subcutaneous, Slough debrided: Level: Skin/Subcutaneous Tissue Debridement Description: Excisional Instrument: Curette Bleeding: Minimum Hemostasis Achieved: Pressure End Time: 14:50 Procedural Pain: 0 Post Procedural Pain: 0 Response to Treatment:  Procedure was tolerated well Level of Consciousness Awake and Alert (Post-procedure): Post Debridement Measurements of Total Wound Length: (cm) 0.5 Width: (cm) 0.4 Depth: (cm) 0.4 Volume: (cm) 0.063 Character of Wound/Ulcer Post Debridement: Improved Severity of Tissue Post Debridement: Fat layer exposed Post Procedure Diagnosis Same as Pre-procedure Electronic Signature(s) Signed: 10/17/2018 5:28:58 PM By: Curtis Sites Signed: 10/20/2018 8:01:46 PM By: Lenda Kelp PA-C Entered By: Curtis Sites on 10/17/2018 14:51:35 Pasty Arch ([id number]) -------------------------------------------------------------------------------- HPI Details Patient Name: Steven Mannan A. Date of Service: 10/17/2018 1:45 PM Medical Record Number: [id number] Patient Account Number: [id number] Date of Birth/Sex: 1971/03/27 (47 y.o. M) Treating RN: Curtis Sites Primary Care Provider: MUSE, ROCHELLE Other Clinician: Referring Provider: MUSE, ROCHELLE Treating Provider/Extender: Linwood Dibbles, Annetta Deiss Weeks in Treatment: 1 History of Present Illness HPI Description: 10/10/18 on evaluation today patient presents for evaluation and clinic concerning an issue that he's been having with his right plantar foot at the third metatarsal head secondary to what he believes to be a nail that he stepped on July 2019. This was on the 17th of the month. Since that time is had a lot of issues with this area and he has not been able to get it to heal appropriately. Fortunately there does not appear to be any evidence of infection. Unfortunately though he doesn't have any significant pain due to neuropathy he also does not seem to be healing as appropriately as we would like. There is in fact a significant amount of necrotic tissue in the central portion of the wound this one does have some depth although there is a significant amount of callous buildup surrounding the wound bed as well all of this is leading to a very hard to  heal ulcer which desperately needs some debridement to clear this way and hopefully allow this a better chance to heal. No fevers, chills, nausea, or vomiting noted at this time. 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 10/17/18 on inspection today patient actually  appears to be doing much better with the gentamicin cream being used on his wound. I did review his wound culture today which showed positive for Pseudomonas. Subsequently I think that likely the improvement he's seeing is due to the topical medication and since he's doing so well I really don't think that we're gonna need to initiate any oral medications at this point. The patient is very happy to hear this. In general I too am pleased that he seems to be doing so well. Electronic Signature(s) Signed: 10/20/2018 8:01:46 PM By: Lenda Kelp PA-C Entered By: Lenda Kelp on 10/20/2018 18:55:52 Pasty Arch ([id number]) -------------------------------------------------------------------------------- Physical Exam Details Patient Name: Steven Mannan A. Date of Service: 10/17/2018 1:45 PM Medical Record Number: [id number] Patient Account Number: [id number] Date of Birth/Sex: Sep 02, 1971 (47 y.o. M) Treating RN: Curtis Sites Primary Care Provider: MUSE, ROCHELLE Other Clinician: Referring Provider: MUSE, ROCHELLE Treating Provider/Extender: STONE III, Audrena Talaga Weeks in Treatment: 1 Constitutional Well-nourished and well-hydrated in no acute distress. Respiratory normal breathing without difficulty. Psychiatric this patient is able to make decisions and demonstrates good insight into disease process. Alert and Oriented x 3. pleasant and cooperative. Notes Patient's wound bed currently shows evidence of good granulation. There was some slight necrotic material that require sharp debridement around the edge of the wound. With that being said post debridement the wound bed appears to be  doing excellent I think you will continue to show signs of good improvement with the proper care of this area. Electronic Signature(s) Signed: 10/20/2018 8:01:46 PM By: Lenda Kelp PA-C Entered By: Lenda Kelp on 10/20/2018 18:56:45 Pasty Arch ([id number]) -------------------------------------------------------------------------------- Physician Orders Details Patient Name: Steven Mannan A. Date of Service: 10/17/2018 1:45 PM Medical Record Number: [id number] Patient Account Number: [id number] Date of Birth/Sex: [birth year] (47 y.o. M) Treating RN: Curtis Sites Primary Care Provider: MUSE, ROCHELLE Other Clinician: Referring Provider: MUSE, ROCHELLE Treating Provider/Extender: Linwood Dibbles, Jago Carton Weeks in Treatment: 1 Verbal / Phone Orders: No Diagnosis Coding ICD-10 Coding Code Description E11.621 Type 2 diabetes mellitus with foot ulcer L97.512 Non-pressure chronic ulcer of other part of right foot with fat layer exposed Wound Cleansing Wound #1 Right Metatarsal head third o Clean wound with Normal Saline. o Cleanse wound with mild soap and water o May Shower, gently pat wound dry prior to applying new dressing. Anesthetic (add to Medication List) Wound #1 Right Metatarsal head third o Topical Lidocaine 4% cream applied to wound bed prior to debridement (In Clinic Only). Primary Wound Dressing Wound #1 Right Metatarsal head third o Silver Alginate - over cream o Other: - gentamicin cream on wound bed Secondary Dressing Wound #1 Right Metatarsal head third o [place] Dressing Change Frequency Wound #1 Right Metatarsal head third o Change dressing every day. Follow-up Appointments Wound #1 Right Metatarsal head third o Return Appointment in 1 week. Off-Loading Wound #1 Right Metatarsal head third o Open toe surgical shoe to: - darco shoe with peg assist Electronic Signature(s) Signed: 10/17/2018 5:28:58 PM By: Curtis Sites Signed:  10/20/2018 8:01:46 PM By: Gerald Stabs, [place] AMarland Kitchen ([id number]) Entered By: Curtis Sites on 10/17/2018 14:52:10 Pasty Arch ([id number]) -------------------------------------------------------------------------------- Problem List Details Patient Name: Steven Mannan A. Date of Service: 10/17/2018 1:45 PM Medical Record Number: [id number] Patient Account Number: [id number] Date of Birth/Sex: 1970-11-21 (47 y.o. M) Treating RN: Curtis Sites Primary Care Provider: MUSE, ROCHELLE Other Clinician: Referring Provider: MUSE, ROCHELLE Treating Provider/Extender: STONE III, Kyung Muto Weeks in Treatment: 1 Active Problems ICD-10  Evaluated Encounter Code Description Active Date Today Diagnosis E11.621 Type 2 diabetes mellitus with foot ulcer 10/10/2018 No Yes L97.512 Non-pressure chronic ulcer of other part of right foot with fat 10/10/2018 No Yes layer exposed Inactive Problems Resolved Problems Electronic Signature(s) Signed: 10/20/2018 8:01:46 PM By: Lenda Kelp PA-C Entered By: Lenda Kelp on 10/17/2018 14:39:57 Pasty Arch ([id number]) -------------------------------------------------------------------------------- Progress Note Details Patient Name: Steven Mannan A. Date of Service: 10/17/2018 1:45 PM Medical Record Number: [id number] Patient Account Number: [id number] Date of Birth/Sex: 1970-12-03 (47 y.o. M) Treating RN: Curtis Sites Primary Care Provider: MUSE, ROCHELLE Other Clinician: Referring Provider: MUSE, ROCHELLE Treating Provider/Extender: Linwood Dibbles, Taylynn Easton Weeks in Treatment: 1 Subjective Chief Complaint Information obtained from Patient Right plantar foot ulcer History of Present Illness (HPI) 10/10/18 on evaluation today patient presents for evaluation and clinic concerning an issue that he's been having with his right plantar foot at the third metatarsal head secondary to what he believes to be a nail that he stepped on July 2019. This was on the 17th  of the month. Since that time is had a lot of issues with this area and he has not been able to get it to heal appropriately. Fortunately there does not appear to be any evidence of infection. Unfortunately though he doesn't have any significant pain due to neuropathy he also does not seem to be healing as appropriately as we would like. There is in fact a significant amount of necrotic tissue in the central portion of the wound this one does have some depth although there is a significant amount of callous buildup surrounding the wound bed as well all of this is leading to a very hard to heal ulcer which desperately needs some debridement to clear this way and hopefully allow this a better chance to heal. No fevers, chills, nausea, or vomiting noted at this time. 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 10/17/18 on inspection today patient actually appears to be doing much better with the gentamicin cream being used on his wound. I did review his wound culture today which showed positive for Pseudomonas. Subsequently I think that likely the improvement he's seeing is due to the topical medication and since he's doing so well I really don't think that we're gonna need to initiate any oral medications at this point. The patient is very happy to hear this. In general I too am pleased that he seems to be doing so well. Patient History Information obtained from Patient. Family History Diabetes - Mother, Heart Disease - Mother, Hypertension - Mother,Father, Kidney Disease - Father, Lung Disease - Mother, Stroke - Mother, Thyroid Problems - Father, Tuberculosis - Maternal Grandparents, No family history of Cancer, Hereditary Spherocytosis. Social History Former smoker - ended on 11/08/2018, Marital Status - Married, Alcohol Use - Rarely, Drug Use - No History, Caffeine Use - Daily. Review of Systems (ROS) Constitutional Symptoms (General Health) Denies  complaints or symptoms of Fever, Chills. Respiratory The patient has no complaints or symptoms. Cardiovascular The patient has no complaints or symptoms. Psychiatric The patient has no complaints or symptoms. JOSHUE, BADAL ([id number]) Objective Constitutional Well-nourished and well-hydrated in no acute distress. Vitals Time Taken: 2:13 PM, Height: 72 in, Weight: 235 lbs, BMI: 31.9, Temperature: 97.8 F, Pulse: 69 bpm, Respiratory Rate: 16 breaths/min, Blood Pressure: 143/80 mmHg. Respiratory normal breathing without difficulty. Psychiatric this patient is able to make decisions and demonstrates good insight into disease process. Alert and  Oriented x 3. pleasant and cooperative. General Notes: Patient's wound bed currently shows evidence of good granulation. There was some slight necrotic material that require sharp debridement around the edge of the wound. With that being said post debridement the wound bed appears to be doing excellent I think you will continue to show signs of good improvement with the proper care of this area. Integumentary (Hair, Skin) Wound #1 status is Open. Original cause of wound was Trauma. The wound is located on the Right Metatarsal head third. The wound measures 0.4cm length x 0.3cm width x 0.5cm depth; 0.094cm^2 area and 0.047cm^3 volume. There is Fat Layer (Subcutaneous Tissue) Exposed exposed. There is no tunneling or undermining noted. There is a medium amount of serous drainage noted. The wound margin is distinct with the outline attached to the wound base. There is small (1-33%) pale granulation within the wound bed. There is a large (67-100%) amount of necrotic tissue within the wound bed including Eschar. The periwound skin appearance exhibited: Callus. The periwound skin appearance did not exhibit: Crepitus, Excoriation, Induration, Rash, Scarring, Dry/Scaly, Maceration, Atrophie Blanche, Cyanosis, Ecchymosis, Hemosiderin Staining, Mottled,  Pallor, Rubor, Erythema. Assessment Active Problems ICD-10 Type 2 diabetes mellitus with foot ulcer Non-pressure chronic ulcer of other part of right foot with fat layer exposed Procedures Wound #1 Pre-procedure diagnosis of Wound #1 is a Diabetic Wound/Ulcer of the Lower Extremity located on the Right Metatarsal head third .Severity of Tissue Pre Debridement is: Fat layer exposed. There was a Excisional Skin/Subcutaneous Tissue Alsteen, Mckinley A. ([id number]) Debridement with a total area of 0.12 sq cm performed by STONE III, Kayman Snuffer E., PA-C. With the following instrument(s): Curette to remove Viable and Non-Viable tissue/material. Material removed includes Callus, Subcutaneous Tissue, and Slough after achieving pain control using Lidocaine 4% Topical Solution. No specimens were taken. A time out was conducted at 14:45, prior to the start of the procedure. A Minimum amount of bleeding was controlled with Pressure. The procedure was tolerated well with a pain level of 0 throughout and a pain level of 0 following the procedure. Post Debridement Measurements: 0.5cm length x 0.4cm width x 0.4cm depth; 0.063cm^3 volume. Character of Wound/Ulcer Post Debridement is improved. Severity of Tissue Post Debridement is: Fat layer exposed. Post procedure Diagnosis Wound #1: Same as Pre-Procedure Plan Wound Cleansing: Wound #1 Right Metatarsal head third: Clean wound with Normal Saline. Cleanse wound with mild soap and water May Shower, gently pat wound dry prior to applying new dressing. Anesthetic (add to Medication List): Wound #1 Right Metatarsal head third: Topical Lidocaine 4% cream applied to wound bed prior to debridement (In Clinic Only). Primary Wound Dressing: Wound #1 Right Metatarsal head third: Silver Alginate - over cream Other: - gentamicin cream on wound bed Secondary Dressing: Wound #1 Right Metatarsal head third: [place] Dressing Change Frequency: Wound #1 Right Metatarsal  head third: Change dressing every day. Follow-up Appointments: Wound #1 Right Metatarsal head third: Return Appointment in 1 week. Off-Loading: Wound #1 Right Metatarsal head third: Open toe surgical shoe to: - darco shoe with peg assist Currently I'm gonna suggest that we continue with the above wound care measures. The patient is in agreement with the plan. We will see were things stand in one week. Please see above for specific wound care orders. We will see patient for re-evaluation in 1 week(s) here in the clinic. If anything worsens or changes patient will contact our office for additional recommendations. Electronic Signature(s) Signed: 10/20/2018 8:01:46 PM By: Lenda Kelp  PA-C Entered By: Lenda Kelp on 10/20/2018 18:58:15 Pasty Arch ([id number]) -------------------------------------------------------------------------------- ROS/PFSH Details Patient Name: Steven Mannan A. Date of Service: 10/17/2018 1:45 PM Medical Record Number: [id number] Patient Account Number: [id number] Date of Birth/Sex: July 11, 1971 (47 y.o. M) Treating RN: Curtis Sites Primary Care Provider: MUSE, ROCHELLE Other Clinician: Referring Provider: MUSE, ROCHELLE Treating Provider/Extender: STONE III, Rajah Tagliaferro Weeks in Treatment: 1 Information Obtained From Patient Wound History Do you currently have one or more open woundso No Have you tested positive for osteomyelitis (bone infection)o No Have you had any tests for circulation on your legso No Have you had other problems associated with your woundso Infection Constitutional Symptoms (General Health) Complaints and Symptoms: Negative for: Fever; Chills Eyes Medical History: Negative for: Cataracts; Glaucoma; Optic Neuritis Ear/Nose/Mouth/Throat Medical History: Negative for: Chronic sinus problems/congestion; Middle ear problems Hematologic/Lymphatic Medical History: Negative for: Anemia; Hemophilia; Human Immunodeficiency Virus;  Lymphedema; Sickle Cell Disease Respiratory Complaints and Symptoms: No Complaints or Symptoms Medical History: Negative for: Aspiration; Asthma; Chronic Obstructive Pulmonary Disease (COPD); Pneumothorax; Sleep Apnea; Tuberculosis Cardiovascular Complaints and Symptoms: No Complaints or Symptoms Medical History: Positive for: Hypertension Negative for: Angina; Arrhythmia; Congestive Heart Failure; Coronary Artery Disease; Deep Vein Thrombosis; Hypotension; Myocardial Infarction; Peripheral Arterial Disease; Peripheral Venous Disease; Phlebitis; Vasculitis Gastrointestinal ADOM, SCHOENECK A. ([id number]) Medical History: Negative for: Cirrhosis ; Colitis; Crohnos; Hepatitis A; Hepatitis B; Hepatitis C Endocrine Medical History: Positive for: Type II Diabetes Negative for: Type I Diabetes Treated with: Oral agents Blood sugar tested every day: Yes Tested : 2/day Genitourinary Medical History: Negative for: End Stage Renal Disease Immunological Medical History: Negative for: Lupus Erythematosus; Raynaudos; Scleroderma Integumentary (Skin) Medical History: Negative for: History of Burn; History of pressure wounds Musculoskeletal Medical History: Negative for: Gout; Rheumatoid Arthritis; Osteoarthritis; Osteomyelitis Neurologic Medical History: Negative for: Dementia; Neuropathy; Quadriplegia; Paraplegia; Seizure Disorder Oncologic Medical History: Negative for: Received Chemotherapy; Received Radiation Psychiatric Complaints and Symptoms: No Complaints or Symptoms Medical History: Negative for: Anorexia/bulimia; Confinement Anxiety Immunizations Pneumococcal Vaccine: Received Pneumococcal Vaccination: No Implantable Devices Family and Social History Cancer: No; Diabetes: Yes - Mother; Heart Disease: Yes - Mother; Hereditary Spherocytosis: No; Hypertension: Yes - Mother,Father; Kidney Disease: Yes - Father; Lung Disease: Yes - Mother; Stroke: Yes - Mother; Thyroid  Problems: Yes LEANARD, DIMAIO ([id number]) Father; Tuberculosis: Yes - Maternal Grandparents; Former smoker - ended on 11/08/2018; Marital Status - Married; Alcohol Use: Rarely; Drug Use: No History; Caffeine Use: Daily; Financial Concerns: Yes; Food, Clothing or Shelter Needs: No; Support System Lacking: No; Transportation Concerns: No; Advanced Directives: No; Patient wants information on Advanced Directives; Advanced Directives information was provided to patient; Do not resuscitate: No; Living Will: No; Medical Power of Attorney: No Electronic Signature(s) Signed: 10/20/2018 8:01:46 PM By: Lenda Kelp PA-C Signed: 10/23/2018 5:43:16 PM By: Curtis Sites Entered By: Lenda Kelp on 10/20/2018 18:56:13 Pasty Arch ([id number]) -------------------------------------------------------------------------------- SuperBill Details Patient Name: Steven Mannan A. Date of Service: 10/17/2018 Medical Record Number: [id number] Patient Account Number: [id number] Date of Birth/Sex: [birth year] (47 y.o. M) Treating RN: Curtis Sites Primary Care Provider: MUSE, ROCHELLE Other Clinician: Referring Provider: MUSE, ROCHELLE Treating Provider/Extender: Linwood Dibbles, Le Ferraz Weeks in Treatment: 1 Diagnosis Coding ICD-10 Codes Code Description E11.621 Type 2 diabetes mellitus with foot ulcer L97.512 Non-pressure chronic ulcer of other part of right foot with fat layer exposed Facility Procedures CPT4 Code: [id number] Description: 11042 - DEB SUBQ TISSUE 20 SQ CM/< ICD-10 Diagnosis Description L97.512 Non-pressure chronic ulcer of other part of right foot with  fat Modifier: layer exposed Quantity: 1 Physician Procedures CPT4 Code: [id number] Description: 11042 - WC PHYS SUBQ TISS 20 SQ CM ICD-10 Diagnosis Description L97.512 Non-pressure chronic ulcer of other part of right foot with fat Modifier: layer exposed Quantity: 1 Electronic Signature(s) Signed: 10/20/2018 8:01:46 PM By: Lenda KelpStone III, Lanisha Stepanian  PA-C Entered By: Lenda KelpStone III, Morell Mears on 10/18/2018 04:57:35

## 2018-10-25 NOTE — Progress Notes (Signed)
RICHARD, RITCHEY ([id number]) Visit Report for 10/24/2018 Chief Complaint Document Details Patient Name: Steven Barnes, Steven Barnes. Date of Service: 10/24/2018 12:30 PM Medical Record Number: [id number] Patient Account Number: [id number] Date of Birth/Sex: 04-Nov-1971 (47 y.o. M) Treating RN: Curtis Sites Primary Care Provider: MUSE, Lorayne Marek Other Clinician: Referring Provider: MUSE, ROCHELLE Treating Provider/Extender: Linwood Dibbles, HOYT Weeks in Treatment: 2 Information Obtained from: Patient Chief Complaint Right plantar foot ulcer Electronic Signature(s) Signed: 10/24/2018 6:07:41 PM By: Lenda Kelp PA-C Entered By: Lenda Kelp on 10/24/2018 12:57:59 Steven Barnes ([id number]) -------------------------------------------------------------------------------- HPI Details Patient Name: Steven Barnes. Date of Service: 10/24/2018 12:30 PM Medical Record Number: [id number] Patient Account Number: [id number] Date of Birth/Sex: 26-Mar-1971 (47 y.o. M) Treating RN: Curtis Sites Primary Care Provider: MUSE, ROCHELLE Other Clinician: Referring Provider: MUSE, ROCHELLE Treating Provider/Extender: Linwood Dibbles, HOYT Weeks in Treatment: 2 History of Present Illness HPI Description: 10/10/18 on evaluation today patient presents for evaluation and clinic concerning an issue that he's been having with his right plantar foot at the third metatarsal head secondary to what he believes to be a nail that he stepped on July 2019. This was on the 17th of the month. Since that time is had a lot of issues with this area and he has not been able to get it to heal appropriately. Fortunately there does not appear to be any evidence of infection. Unfortunately though he doesn't have any significant pain due to neuropathy he also does not seem to be healing as appropriately as we would like. There is in fact a significant amount of necrotic tissue in the central portion of the wound this one does have some depth  although there is a significant amount of callous buildup surrounding the wound bed as well all of this is leading to a very hard to heal ulcer which desperately needs some debridement to clear this way and hopefully allow this a better chance to heal. No fevers, chills, nausea, or vomiting noted at this time. 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 10/17/18 on inspection today patient actually appears to be doing much better with the gentamicin cream being used on his wound. I did review his wound culture today which showed positive for Pseudomonas. Subsequently I think that likely the improvement he's seeing is due to the topical medication and since he's doing so well I really don't think that we're gonna need to initiate any oral medications at this point. The patient is very happy to hear this. In general I too am pleased that he seems to be doing so well. 10/24/18 on evaluation today patient's plantar foot ulcer actually appears to be doing fairly well at this time. He has been tolerating the dressing changes without complication. Overall this is just a very small opening remaining they cannot even really pack in the silver cell at this point. I think we may want to switch to Prisma. Electronic Signature(s) Signed: 10/24/2018 6:07:41 PM By: Lenda Kelp PA-C Entered By: Lenda Kelp on 10/24/2018 14:55:14 Steven Barnes ([id number]) -------------------------------------------------------------------------------- Physical Exam Details Patient Name: Steven Barnes A. Date of Service: 10/24/2018 12:30 PM Medical Record Number: [id number] Patient Account Number: [id number] Date of Birth/Sex: [birth year] (47 y.o. M) Treating RN: Curtis Sites Primary Care Provider: MUSE, ROCHELLE Other Clinician: Referring Provider: MUSE, ROCHELLE Treating Provider/Extender: STONE III, HOYT Weeks in Treatment: 2 Constitutional Well-nourished and  well-hydrated in no acute distress. Respiratory normal breathing without difficulty. clear to  auscultation bilaterally. Cardiovascular regular rate and rhythm with normal S1, S2. Psychiatric this patient is able to make decisions and demonstrates good insight into disease process. Alert and Oriented x 3. pleasant and cooperative. Notes Patient's wound not require sharp agreement at this time which is good news. Subsequently I do think that the offloading shoe seems to been of benefit for him I do think diabetic shoes would be appropriate going forward as well. Hopefully this area is gonna heal shortly. Electronic Signature(s) Signed: 10/24/2018 6:07:41 PM By: Lenda KelpStone III, Hoyt PA-C Entered By: Lenda KelpStone III, Hoyt on 10/24/2018 14:55:38 Steven ArchWRAY, [name] ([id number]) -------------------------------------------------------------------------------- Physician Orders Details Patient Name: Steven MannanWRAY, [name] Date of Service: 10/24/2018 12:30 PM Medical Record Number: [id number] Patient Account Number: [id number] Date of Birth/Sex: 06/10/1971 (47 y.o. M) Treating RN: Curtis Sitesorthy, Joanna Primary Care Provider: MUSE, ROCHELLE Other Clinician: Referring Provider: MUSE, ROCHELLE Treating Provider/Extender: Linwood DibblesSTONE III, HOYT Weeks in Treatment: 2 Verbal / Phone Orders: No Diagnosis Coding ICD-10 Coding Code Description E11.621 Type 2 diabetes mellitus with foot ulcer L97.512 Non-pressure chronic ulcer of other part of right foot with fat layer exposed Wound Cleansing Wound #1 Right Metatarsal head third o Clean wound with Normal Saline. o Cleanse wound with mild soap and water o May Shower, gently pat wound dry prior to applying new dressing. Anesthetic (add to Medication List) Wound #1 Right Metatarsal head third o Topical Lidocaine 4% cream applied to wound bed prior to debridement (In Clinic Only). Primary Wound Dressing Wound #1 Right Metatarsal head third o Silver Collagen Secondary  Dressing Wound #1 Right Metatarsal head third o [place] Dressing Change Frequency Wound #1 Right Metatarsal head third o Change dressing every day. Follow-up Appointments Wound #1 Right Metatarsal head third o Return Appointment in 2 weeks. Off-Loading Wound #1 Right Metatarsal head third o Open toe surgical shoe to: - darco shoe with peg assist Electronic Signature(s) Signed: 10/24/2018 5:03:08 PM By: Curtis Sitesorthy, Joanna Signed: 10/24/2018 6:07:41 PM By: Lenda KelpStone III, Hoyt PA-C Entered By: Curtis Sitesorthy, Joanna on 10/24/2018 13:09:13 Steven ArchWRAY, [name] ([id number]) Steven ArchWRAY, [name] ([id number]) -------------------------------------------------------------------------------- Problem List Details Patient Name: Steven MannanWRAY, Malikah A. Date of Service: 10/24/2018 12:30 PM Medical Record Number: [id number] Patient Account Number: [id number] Date of Birth/Sex: 05/11/1971 (47 y.o. M) Treating RN: Curtis Sitesorthy, Joanna Primary Care Provider: MUSE, Lorayne MarekOCHELLE Other Clinician: Referring Provider: MUSE, ROCHELLE Treating Provider/Extender: Linwood DibblesSTONE III, HOYT Weeks in Treatment: 2 Active Problems ICD-10 Evaluated Encounter Code Description Active Date Today Diagnosis E11.621 Type 2 diabetes mellitus with foot ulcer 10/10/2018 No Yes L97.512 Non-pressure chronic ulcer of other part of right foot with fat 10/10/2018 No Yes layer exposed Inactive Problems Resolved Problems Electronic Signature(s) Signed: 10/24/2018 6:07:41 PM By: Lenda KelpStone III, Hoyt PA-C Entered By: Lenda KelpStone III, Hoyt on 10/24/2018 12:57:53 Steven ArchWRAY, Steven Barnes A. ([id number]) -------------------------------------------------------------------------------- Progress Note Details Patient Name: Steven MannanWRAY, Rudell A. Date of Service: 10/24/2018 12:30 PM Medical Record Number: [id number] Patient Account Number: [id number] Date of Birth/Sex: 11/10/1970 (47 y.o. M) Treating RN: Curtis Sitesorthy, Joanna Primary Care Provider: MUSE, ROCHELLE Other Clinician: Referring Provider: MUSE,  ROCHELLE Treating Provider/Extender: Linwood DibblesSTONE III, HOYT Weeks in Treatment: 2 Subjective Chief Complaint Information obtained from Patient Right plantar foot ulcer History of Present Illness (HPI) 10/10/18 on evaluation today patient presents for evaluation and clinic concerning an issue that he's been having with his right plantar foot at the third metatarsal head secondary to what he believes to be a nail that he stepped on July 2019. This was on the 17th of the month. Since that time  is had a lot of issues with this area and he has not been able to get it to heal appropriately. Fortunately there does not appear to be any evidence of infection. Unfortunately though he doesn't have any significant pain due to neuropathy he also does not seem to be healing as appropriately as we would like. There is in fact a significant amount of necrotic tissue in the central portion of the wound this one does have some depth although there is a significant amount of callous buildup surrounding the wound bed as well all of this is leading to a very hard to heal ulcer which desperately needs some debridement to clear this way and hopefully allow this a better chance to heal. No fevers, chills, nausea, or vomiting noted at this time. 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 10/17/18 on inspection today patient actually appears to be doing much better with the gentamicin cream being used on his wound. I did review his wound culture today which showed positive for Pseudomonas. Subsequently I think that likely the improvement he's seeing is due to the topical medication and since he's doing so well I really don't think that we're gonna need to initiate any oral medications at this point. The patient is very happy to hear this. In general I too am pleased that he seems to be doing so well. 10/24/18 on evaluation today patient's plantar foot ulcer actually appears to be  doing fairly well at this time. He has been tolerating the dressing changes without complication. Overall this is just a very small opening remaining they cannot even really pack in the silver cell at this point. I think we may want to switch to Prisma. Patient History Information obtained from Patient. Family History Diabetes - Mother, Heart Disease - Mother, Hypertension - Mother,Father, Kidney Disease - Father, Lung Disease - Mother, Stroke - Mother, Thyroid Problems - Father, Tuberculosis - Maternal Grandparents, No family history of Cancer, Hereditary Spherocytosis. Social History Former smoker - ended on 11/08/2018, Marital Status - Married, Alcohol Use - Rarely, Drug Use - No History, Caffeine Use - Daily. Review of Systems (ROS) Constitutional Symptoms (General Health) Denies complaints or symptoms of Fever, Chills. Respiratory The patient has no complaints or symptoms. Cardiovascular The patient has no complaints or symptoms. Psychiatric Steven Barnes, Steven Barnes ([id number]) The patient has no complaints or symptoms. Objective Constitutional Well-nourished and well-hydrated in no acute distress. Vitals Time Taken: 12:37 PM, Height: 72 in, Weight: 235 lbs, BMI: 31.9, Temperature: 97.8 F, Pulse: 91 bpm, Respiratory Rate: 16 breaths/min, Blood Pressure: 124/71 mmHg. Respiratory normal breathing without difficulty. clear to auscultation bilaterally. Cardiovascular regular rate and rhythm with normal S1, S2. Psychiatric this patient is able to make decisions and demonstrates good insight into disease process. Alert and Oriented x 3. pleasant and cooperative. General Notes: Patient's wound not require sharp agreement at this time which is good news. Subsequently I do think that the offloading shoe seems to been of benefit for him I do think diabetic shoes would be appropriate going forward as well. Hopefully this area is gonna heal shortly. Integumentary (Hair, Skin) Wound #1 status is  Open. Original cause of wound was Trauma. The wound is located on the Right Metatarsal head third. The wound measures 0.3cm length x 0.3cm width x 0.5cm depth; 0.071cm^2 area and 0.035cm^3 volume. There is Fat Layer (Subcutaneous Tissue) Exposed exposed. There is no tunneling or undermining noted. There is a medium amount of  serous drainage noted. The wound margin is distinct with the outline attached to the wound base. There is small (1-33%) pale granulation within the wound bed. There is a large (67-100%) amount of necrotic tissue within the wound bed including Eschar. The periwound skin appearance exhibited: Callus. The periwound skin appearance did not exhibit: Crepitus, Excoriation, Induration, Rash, Scarring, Dry/Scaly, Maceration, Atrophie Blanche, Cyanosis, Ecchymosis, Hemosiderin Staining, Mottled, Pallor, Rubor, Erythema. Periwound temperature was noted as No Abnormality. Assessment Active Problems ICD-10 Type 2 diabetes mellitus with foot ulcer Non-pressure chronic ulcer of other part of right foot with fat layer exposed Steven Barnes, [name] ([id number]) Plan Wound Cleansing: Wound #1 Right Metatarsal head third: Clean wound with Normal Saline. Cleanse wound with mild soap and water May Shower, gently pat wound dry prior to applying new dressing. Anesthetic (add to Medication List): Wound #1 Right Metatarsal head third: Topical Lidocaine 4% cream applied to wound bed prior to debridement (In Clinic Only). Primary Wound Dressing: Wound #1 Right Metatarsal head third: Silver Collagen Secondary Dressing: Wound #1 Right Metatarsal head third: [place] Dressing Change Frequency: Wound #1 Right Metatarsal head third: Change dressing every day. Follow-up Appointments: Wound #1 Right Metatarsal head third: Return Appointment in 2 weeks. Off-Loading: Wound #1 Right Metatarsal head third: Open toe surgical shoe to: - darco shoe with peg assist I am gonna see him back for  reevaluation in one weeks time to see were things stand. The patient is in agreement that plan. If anything changes or worsens meantime he will contact the office and let me know. Otherwise my hope is that we will be able to get this area closer the next two weeks. Please see above for specific wound care orders. We will see patient for re-evaluation in 2 week(s) here in the clinic. If anything worsens or changes patient will contact our office for additional recommendations. Electronic Signature(s) Signed: 10/24/2018 6:07:41 PM By: Lenda Kelp PA-C Entered By: Lenda Kelp on 10/24/2018 14:56:01 Steven Barnes ([id number]) -------------------------------------------------------------------------------- ROS/PFSH Details Patient Name: Steven Barnes A. Date of Service: 10/24/2018 12:30 PM Medical Record Number: [id number] Patient Account Number: [id number] Date of Birth/Sex: 1971/07/26 (47 y.o. M) Treating RN: Curtis Sites Primary Care Provider: MUSE, ROCHELLE Other Clinician: Referring Provider: MUSE, ROCHELLE Treating Provider/Extender: STONE III, HOYT Weeks in Treatment: 2 Information Obtained From Patient Wound History Do you currently have one or more open woundso No Have you tested positive for osteomyelitis (bone infection)o No Have you had any tests for circulation on your legso No Have you had other problems associated with your woundso Infection Constitutional Symptoms (General Health) Complaints and Symptoms: Negative for: Fever; Chills Eyes Medical History: Negative for: Cataracts; Glaucoma; Optic Neuritis Ear/Nose/Mouth/Throat Medical History: Negative for: Chronic sinus problems/congestion; Middle ear problems Hematologic/Lymphatic Medical History: Negative for: Anemia; Hemophilia; Human Immunodeficiency Virus; Lymphedema; Sickle Cell Disease Respiratory Complaints and Symptoms: No Complaints or Symptoms Medical History: Negative for: Aspiration; Asthma;  Chronic Obstructive Pulmonary Disease (COPD); Pneumothorax; Sleep Apnea; Tuberculosis Cardiovascular Complaints and Symptoms: No Complaints or Symptoms Medical History: Positive for: Hypertension Negative for: Angina; Arrhythmia; Congestive Heart Failure; Coronary Artery Disease; Deep Vein Thrombosis; Hypotension; Myocardial Infarction; Peripheral Arterial Disease; Peripheral Venous Disease; Phlebitis; Vasculitis Gastrointestinal Steven Barnes, [name] ([id number]) Medical History: Negative for: Cirrhosis ; Colitis; Crohnos; Hepatitis A; Hepatitis B; Hepatitis C Endocrine Medical History: Positive for: Type II Diabetes Negative for: Type I Diabetes Treated with: Oral agents Blood sugar tested every day: Yes Tested : 2/day Genitourinary Medical History: Negative for: End Stage  Renal Disease Immunological Medical History: Negative for: Lupus Erythematosus; Raynaudos; Scleroderma Integumentary (Skin) Medical History: Negative for: History of Burn; History of pressure wounds Musculoskeletal Medical History: Negative for: Gout; Rheumatoid Arthritis; Osteoarthritis; Osteomyelitis Neurologic Medical History: Negative for: Dementia; Neuropathy; Quadriplegia; Paraplegia; Seizure Disorder Oncologic Medical History: Negative for: Received Chemotherapy; Received Radiation Psychiatric Complaints and Symptoms: No Complaints or Symptoms Medical History: Negative for: Anorexia/bulimia; Confinement Anxiety Immunizations Pneumococcal Vaccine: Received Pneumococcal Vaccination: No Implantable Devices Family and Social History Cancer: No; Diabetes: Yes - Mother; Heart Disease: Yes - Mother; Hereditary Spherocytosis: No; Hypertension: Yes - Mother,Father; Kidney Disease: Yes - Father; Lung Disease: Yes - Mother; Stroke: Yes - Mother; Thyroid Problems: Yes Steven Barnes, Steven Barnes ([id number]) Father; Tuberculosis: Yes - Maternal Grandparents; Former smoker - ended on 11/08/2018; Marital Status - Married;  Alcohol Use: Rarely; Drug Use: No History; Caffeine Use: Daily; Financial Concerns: Yes; Food, Clothing or Shelter Needs: No; Support System Lacking: No; Transportation Concerns: No; Advanced Directives: No; Patient wants information on Advanced Directives; Advanced Directives information was provided to patient; Do not resuscitate: No; Living Will: No; Medical Power of Attorney: No Physician Affirmation I have reviewed and agree with the above information. Electronic Signature(s) Signed: 10/24/2018 5:03:08 PM By: Curtis Sites Signed: 10/24/2018 6:07:41 PM By: Lenda Kelp PA-C Entered By: Lenda Kelp on 10/24/2018 14:55:27 Steven Barnes ([id number]) -------------------------------------------------------------------------------- SuperBill Details Patient Name: Steven Barnes A. Date of Service: 10/24/2018 Medical Record Number: [id number] Patient Account Number: [id number] Date of Birth/Sex: 1971/04/28 (47 y.o. M) Treating RN: Curtis Sites Primary Care Provider: MUSE, Lorayne Marek Other Clinician: Referring Provider: MUSE, ROCHELLE Treating Provider/Extender: Linwood Dibbles, HOYT Weeks in Treatment: 2 Diagnosis Coding ICD-10 Codes Code Description E11.621 Type 2 diabetes mellitus with foot ulcer L97.512 Non-pressure chronic ulcer of other part of right foot with fat layer exposed Facility Procedures CPT4 Code: [id number] Description: 99213 - WOUND CARE VISIT-LEV 3 EST PT Modifier: Quantity: 1 Physician Procedures CPT4 Code: [id number] Description: 99214 - WC PHYS LEVEL 4 - EST PT ICD-10 Diagnosis Description E11.621 Type 2 diabetes mellitus with foot ulcer L97.512 Non-pressure chronic ulcer of other part of right foot with fat Modifier: layer exposed Quantity: 1 Electronic Signature(s) Signed: 10/24/2018 6:07:41 PM By: Lenda Kelp PA-C Entered By: Lenda Kelp on 10/24/2018 14:56:17

## 2018-11-09 ENCOUNTER — Encounter: Payer: BLUE CROSS/BLUE SHIELD | Attending: Physician Assistant | Admitting: Physician Assistant

## 2018-11-09 DIAGNOSIS — Z87891 Personal history of nicotine dependence: Secondary | ICD-10-CM | POA: Insufficient documentation

## 2018-11-09 DIAGNOSIS — E11621 Type 2 diabetes mellitus with foot ulcer: Secondary | ICD-10-CM | POA: Diagnosis not present

## 2018-11-09 DIAGNOSIS — I10 Essential (primary) hypertension: Secondary | ICD-10-CM | POA: Insufficient documentation

## 2018-11-09 DIAGNOSIS — L97512 Non-pressure chronic ulcer of other part of right foot with fat layer exposed: Secondary | ICD-10-CM | POA: Diagnosis present

## 2018-11-10 NOTE — Progress Notes (Signed)
Steven Barnes ([id number]) Visit Report for 11/09/2018 Arrival Information Details Patient Name: Steven Barnes, Steven Barnes. Date of Service: 11/09/2018 12:45 PM Medical Record Number: [id number] Patient Account Number: [id number] Date of Birth/Sex: [birth year] (48 y.o. M) Treating RN: Steven Barnes Primary Care Arizona Nordquist: Barnes, Steven Other Clinician: Referring Steven Barnes: Barnes, Steven Treating Steven Barnes/Extender: Steven Barnes Weeks in Treatment: 4 Visit Information History Since Last Visit Added or deleted any medications: No Patient Arrived: Ambulatory Any new allergies or adverse reactions: No Arrival Time: 12:42 Had a fall or experienced change in No Accompanied By: wife activities of daily living that may affect Transfer Assistance: None risk of falls: Patient Identification Verified: Yes Signs or symptoms of abuse/neglect since last visito No Secondary Verification Process Completed: Yes Hospitalized since last visit: No Implantable device outside of the clinic excluding No cellular tissue based products placed in the center since last visit: Has Dressing in Place as Prescribed: Yes Pain Present Now: No Electronic Signature(s) Signed: 11/09/2018 3:54:51 PM By: Steven Barnes RCP, RRT, CHT Entered By: Steven Barnes on 11/09/2018 12:43:11 Pasty Arch ([id number]) -------------------------------------------------------------------------------- Clinic Level of Care Assessment Details Patient Name: Steven Barnes A. Date of Service: 11/09/2018 12:45 PM Medical Record Number: [id number] Patient Account Number: [id number] Date of Birth/Sex: 1971-01-18 (48 y.o. M) Treating RN: Steven Barnes Primary Care Rodriques Barnes: Barnes, Steven Other Clinician: Referring Steven Barnes: Barnes, Steven Treating Steven Barnes/Extender: STONE III, Barnes Weeks in Treatment: 4 Clinic Level of Care Assessment Items TOOL 4 Quantity Score []  - Use when only an EandM is performed on FOLLOW-UP visit  0 ASSESSMENTS - Nursing Assessment / Reassessment X - Reassessment of Co-morbidities (includes updates in patient status) 1 10 X- 1 5 Reassessment of Adherence to Treatment Plan ASSESSMENTS - Wound and Skin Assessment / Reassessment X - Simple Wound Assessment / Reassessment - one wound 1 5 []  - 0 Complex Wound Assessment / Reassessment - multiple wounds []  - 0 Dermatologic / Skin Assessment (not related to wound area) ASSESSMENTS - Focused Assessment []  - Circumferential Edema Measurements - multi extremities 0 []  - 0 Nutritional Assessment / Counseling / Intervention []  - 0 Lower Extremity Assessment (monofilament, tuning fork, pulses) []  - 0 Peripheral Arterial Disease Assessment (using hand held doppler) ASSESSMENTS - Ostomy and/or Continence Assessment and Care []  - Incontinence Assessment and Management 0 []  - 0 Ostomy Care Assessment and Management (repouching, etc.) PROCESS - Coordination of Care X - Simple Patient / Family Education for ongoing care 1 15 []  - 0 Complex (extensive) Patient / Family Education for ongoing care X- 1 10 Staff obtains Chiropractor, Records, Test Results / Process Orders []  - 0 Staff telephones HHA, Nursing Homes / Clarify orders / etc []  - 0 Routine Transfer to another Facility (non-emergent condition) []  - 0 Routine Hospital Admission (non-emergent condition) []  - 0 New Admissions / Manufacturing engineer / Ordering NPWT, Apligraf, etc. []  - 0 Emergency Hospital Admission (emergent condition) X- 1 10 Simple Discharge Coordination Steven Barnes, Steven Barnes A. ([id number]) []  - 0 Complex (extensive) Discharge Coordination PROCESS - Special Needs []  - Pediatric / Minor Patient Management 0 []  - 0 Isolation Patient Management []  - 0 Hearing / Language / Visual special needs []  - 0 Assessment of Community assistance (transportation, D/C planning, etc.) []  - 0 Additional assistance / Altered mentation []  - 0 Support Surface(s) Assessment (bed,  cushion, seat, etc.) INTERVENTIONS - Wound Cleansing / Measurement X - Simple Wound Cleansing - one wound 1 5 []  - 0 Complex Wound Cleansing - multiple wounds  X- 1 5 Wound Imaging (photographs - any number of wounds) []  - 0 Wound Tracing (instead of photographs) X- 1 5 Simple Wound Measurement - one wound []  - 0 Complex Wound Measurement - multiple wounds INTERVENTIONS - Wound Dressings []  - Small Wound Dressing one or multiple wounds 0 []  - 0 Medium Wound Dressing one or multiple wounds []  - 0 Large Wound Dressing one or multiple wounds []  - 0 Application of Medications - topical []  - 0 Application of Medications - injection INTERVENTIONS - Miscellaneous []  - External ear exam 0 []  - 0 Specimen Collection (cultures, biopsies, blood, body fluids, etc.) []  - 0 Specimen(s) / Culture(s) sent or taken to Lab for analysis []  - 0 Patient Transfer (multiple staff / Nurse, adultHoyer Lift / Similar devices) []  - 0 Simple Staple / Suture removal (25 or less) []  - 0 Complex Staple / Suture removal (26 or more) []  - 0 Hypo / Hyperglycemic Management (close monitor of Blood Glucose) []  - 0 Ankle / Brachial Index (ABI) - do not check if billed separately X- 1 5 Vital Signs Mcnamara, Geraldo A. ([id number]) Has the patient been seen at the hospital within the last three years: Yes Total Score: 75 Level Of Care: New/Established - Level 2 Electronic Signature(s) Signed: 11/09/2018 3:53:38 PM By: Steven NorrisBiell, Barnes Entered By: Steven NorrisBiell, Barnes on 11/09/2018 12:53:03 Pasty ArchWRAY, [name] ([id number]) -------------------------------------------------------------------------------- Encounter Discharge Information Details Patient Name: Steven MannanWRAY, [name] Date of Service: 11/09/2018 12:45 PM Medical Record Number: [id number] Patient Account Number: [id number] Date of Birth/Sex: 02/16/1971 (48 y.o. M) Treating RN: Steven NorrisBiell, Barnes Primary Care Eadie Repetto: Barnes, Lorayne MarekOCHELLE Other Clinician: Referring Steven Barnes: Barnes,  Steven Treating Temiloluwa Recchia/Extender: Steven DibblesSTONE III, Barnes Weeks in Treatment: 4 Encounter Discharge Information Items Discharge Condition: Stable Ambulatory Status: Ambulatory Discharge Destination: Home Transportation: Private Auto Accompanied By: wife Schedule Follow-up Appointment: Yes Clinical Summary of Care: Electronic Signature(s) Signed: 11/09/2018 3:53:38 PM By: Steven NorrisBiell, Barnes Entered By: Steven NorrisBiell, Barnes on 11/09/2018 13:07:34 Pasty ArchWRAY, [name] ([id number]) -------------------------------------------------------------------------------- Lower Extremity Assessment Details Patient Name: Steven MannanWRAY, [name] Date of Service: 11/09/2018 12:45 PM Medical Record Number: [id number] Patient Account Number: [id number] Date of Birth/Sex: 04/09/1971 (48 y.o. M) Treating RN: Steven NorrisBiell, Barnes Primary Care Juanya Villavicencio: Kizzie FurnishMUSE, Steven Other Clinician: Referring Herbert Aguinaldo: Barnes, Steven Treating Wilford Merryfield/Extender: Steven DibblesSTONE III, Barnes Weeks in Treatment: 4 Electronic Signature(s) Signed: 11/09/2018 3:53:38 PM By: Steven NorrisBiell, Barnes Entered By: Steven NorrisBiell, Barnes on 11/09/2018 12:48:31 Pasty ArchWRAY, Kathryn A. ([id number]) -------------------------------------------------------------------------------- Multi Wound Chart Details Patient Name: Steven MannanWRAY, [name] Date of Service: 11/09/2018 12:45 PM Medical Record Number: [id number] Patient Account Number: [id number] Date of Birth/Sex: 09/02/1971 (48 y.o. M) Treating RN: Steven NorrisBiell, Barnes Primary Care Alichia Alridge: Barnes, Steven Other Clinician: Referring Nazirah Tri: Barnes, Steven Treating Khari Lett/Extender: STONE III, Barnes Weeks in Treatment: 4 Vital Signs Height(in): 72 Pulse(bpm): 86 Weight(lbs): 235 Blood Pressure(mmHg): 117/72 Body Mass Index(BMI): 32 Temperature(F): 98.1 Respiratory Rate 16 (breaths/min): Photos: [1:No Photos] [N/A:N/A] Wound Location: [1:Right Metatarsal head third] [N/A:N/A] Wounding Event: [1:Trauma] [N/A:N/A] Primary Etiology: [1:Diabetic Wound/Ulcer of the  N/A Lower Extremity] Secondary Etiology: [1:Trauma, Other] [N/A:N/A] Comorbid History: [1:Hypertension, Type II Diabetes N/A] Date Acquired: [1:05/24/2018] [N/A:N/A] Weeks of Treatment: [1:4] [N/A:N/A] Wound Status: [1:Open] [N/A:N/A] Measurements L x W x D [1:0.1x0.1x0.1] [N/A:N/A] (cm) Area (cm) : [1:0.008] [N/A:N/A] Volume (cm) : [1:0.001] [N/A:N/A] % Reduction in Area: [1:99.00%] [N/A:N/A] % Reduction in Volume: [1:99.90%] [N/A:N/A] Classification: [1:Grade 1] [N/A:N/A] Exudate Amount: [1:Medium] [N/A:N/A] Exudate Type: [1:Serous] [N/A:N/A] Exudate Color: [1:amber] [N/A:N/A] Wound Margin: [1:Distinct, outline attached] [N/A:N/A] Granulation Amount: [1:Small (1-33%)] [N/A:N/A] Granulation Quality: [1:Pale] [  N/A:N/A] Necrotic Amount: [1:Large (67-100%)] [N/A:N/A] Necrotic Tissue: [1:Eschar] [N/A:N/A] Exposed Structures: [1:Fat Layer (Subcutaneous Tissue) Exposed: Yes Fascia: No Tendon: No Muscle: No Joint: No Bone: No] [N/A:N/A] Epithelialization: [1:None] [N/A:N/A] Periwound Skin Texture: [1:Callus: Yes Excoriation: No Induration: No Crepitus: No] [N/A:N/A] Rash: No Scarring: No Periwound Skin Moisture: Maceration: No N/A N/A Dry/Scaly: No Periwound Skin Color: Atrophie Blanche: No N/A N/A Cyanosis: No Ecchymosis: No Erythema: No Hemosiderin Staining: No Mottled: No Pallor: No Rubor: No Temperature: No Abnormality N/A N/A Tenderness on Palpation: No N/A N/A Wound Preparation: Ulcer Cleansing: N/A N/A Rinsed/Irrigated with Saline Topical Anesthetic Applied: Other: lidocaine 4% Treatment Notes Electronic Signature(s) Signed: 11/09/2018 3:53:38 PM By: Steven Barnes Entered By: Steven Barnes on 11/09/2018 12:51:19 Pasty Arch ([id number]) -------------------------------------------------------------------------------- Multi-Disciplinary Care Plan Details Patient Name: Steven Barnes A. Date of Service: 11/09/2018 12:45 PM Medical Record Number:  [id number] Patient Account Number: [id number] Date of Birth/Sex: July 02, 1971 (48 y.o. M) Treating RN: Steven Barnes Primary Care Muaaz Brau: Kizzie Furnish Other Clinician: Referring Leyla Soliz: Barnes, Steven Treating Shaheim Mahar/Extender: Steven Barnes Weeks in Treatment: 4 Active Inactive Electronic Signature(s) Signed: 11/09/2018 3:53:38 PM By: Steven Barnes Entered By: Steven Barnes on 11/09/2018 13:06:16 Pasty Arch ([id number]) -------------------------------------------------------------------------------- Pain Assessment Details Patient Name: Steven Barnes A. Date of Service: 11/09/2018 12:45 PM Medical Record Number: [id number] Patient Account Number: [id number] Date of Birth/Sex: November 14, 1970 (48 y.o. M) Treating RN: Steven Barnes Primary Care Elanore Talcott: Barnes, Steven Other Clinician: Referring Emmily Pellegrin: Barnes, Steven Treating Zalika Tieszen/Extender: STONE III, Barnes Weeks in Treatment: 4 Active Problems Location of Pain Severity and Description of Pain Patient Has Paino No Site Locations Pain Management and Medication Current Pain Management: Electronic Signature(s) Signed: 11/09/2018 3:54:51 PM By: Sallee Provencal, RRT, CHT Signed: 11/09/2018 4:35:36 PM By: Steven Barnes Entered By: Steven Barnes on 11/09/2018 12:43:17 Pasty Arch ([id number]) -------------------------------------------------------------------------------- Patient/Caregiver Education Details Patient Name: Steven Barnes A. Date of Service: 11/09/2018 12:45 PM Medical Record Number: [id number] Patient Account Number: [id number] Date of Birth/Gender: Feb 09, 1971 (48 y.o. M) Treating RN: Steven Barnes Primary Care Physician: Steven Barnes Other Clinician: Referring Physician: MUSE, Steven Treating Physician/Extender: Skeet Simmer in Treatment: 4 Education Assessment Education Provided To: Patient Education Topics Provided Wound/Skin Impairment: Handouts: Caring for Your  Ulcer Methods: Demonstration, Explain/Verbal Responses: State content correctly Electronic Signature(s) Signed: 11/09/2018 3:53:38 PM By: Steven Barnes Entered By: Steven Barnes on 11/09/2018 13:07:42 Pasty Arch ([id number]) -------------------------------------------------------------------------------- Wound Assessment Details Patient Name: Steven Barnes A. Date of Service: 11/09/2018 12:45 PM Medical Record Number: [id number] Patient Account Number: [id number] Date of Birth/Sex: 1971-04-26 (48 y.o. M) Treating RN: Steven Barnes Primary Care Matty Vanroekel: Barnes, Steven Other Clinician: Referring Adryanna Friedt: Barnes, Steven Treating Johnpatrick Jenny/Extender: STONE III, Barnes Weeks in Treatment: 4 Wound Status Wound Number: 1 Primary Etiology: Diabetic Wound/Ulcer of the Lower Extremity Wound Location: Right Metatarsal head third Secondary Trauma, Other Wounding Event: Trauma Etiology: Date Acquired: 05/24/2018 Wound Status: Healed - Epithelialized Weeks Of Treatment: 4 Comorbid History: Hypertension, Type II Diabetes Clustered Wound: No Photos Photo Uploaded By: Steven Barnes on 11/09/2018 15:08:38 Wound Measurements Length: (cm) 0 % Re Width: (cm) 0 % Re Depth: (cm) 0 Epit Area: (cm) 0 Tun Volume: (cm) 0 Und duction in Area: 100% duction in Volume: 100% helialization: None neling: No ermining: No Wound Description Classification: Grade 1 Wound Margin: Distinct, outline attached Exudate Amount: Medium Exudate Type: Serous Exudate Color: amber Foul Odor After Cleansing: No Slough/Fibrino No Wound Bed Granulation Amount: Small (1-33%) Exposed Structure Granulation Quality: Pale Fascia Exposed:  No Necrotic Amount: Large (67-100%) Fat Layer (Subcutaneous Tissue) Exposed: Yes Necrotic Quality: Eschar Tendon Exposed: No Muscle Exposed: No Joint Exposed: No Bone Exposed: No Periwound Skin Texture Baig, [name] ([id number]) Texture Color No Abnormalities Noted:  No No Abnormalities Noted: No Callus: Yes Atrophie Blanche: No Crepitus: No Cyanosis: No Excoriation: No Ecchymosis: No Induration: No Erythema: No Rash: No Hemosiderin Staining: No Scarring: No Mottled: No Pallor: No Moisture Rubor: No No Abnormalities Noted: No Dry / Scaly: No Temperature / Pain Maceration: No Temperature: No Abnormality Wound Preparation Ulcer Cleansing: Rinsed/Irrigated with Saline Topical Anesthetic Applied: Other: lidocaine 4%, Electronic Signature(s) Signed: 11/09/2018 3:53:38 PM By: Steven NorrisBiell, Barnes Entered By: Steven NorrisBiell, Barnes on 11/09/2018 13:01:47 Pasty ArchWRAY, [name] ([id number]) -------------------------------------------------------------------------------- Vitals Details Patient Name: Steven MannanWRAY, Limmie A. Date of Service: 11/09/2018 12:45 PM Medical Record Number: [id number] Patient Account Number: [id number] Date of Birth/Sex: 01/01/1971 (48 y.o. M) Treating RN: Steven Sitesorthy, Joanna Primary Care Aldene Hendon: Barnes, Steven Other Clinician: Referring Roddrick Sharron: Barnes, Steven Treating Marquee Fuchs/Extender: STONE III, Barnes Weeks in Treatment: 4 Vital Signs Time Taken: 12:43 Temperature (F): 98.1 Height (in): 72 Pulse (bpm): 86 Weight (lbs): 235 Respiratory Rate (breaths/min): 16 Body Mass Index (BMI): 31.9 Blood Pressure (mmHg): 117/72 Reference Range: 80 - 120 mg / dl Electronic Signature(s) Signed: 11/09/2018 3:54:51 PM By: Steven MartesWallace, RCP,RRT,CHT, Sallie RCP, RRT, CHT Entered By: Steven MartesWallace, RCP,RRT,CHT, Sallie on 11/09/2018 12:45:14

## 2018-11-11 NOTE — Progress Notes (Signed)
SENA, CLOUATRE ([id number]) Visit Report for 11/09/2018 Chief Complaint Document Details Patient Name: Steven Barnes, Steven Barnes. Date of Service: 11/09/2018 12:45 PM Medical Record Number: [id number] Patient Account Number: [id number] Date of Birth/Sex: 01/26/1971 (48 y.o. M) Treating RN: Curtis Sites Primary Care Provider: MUSE, Lorayne Marek Other Clinician: Referring Provider: MUSE, ROCHELLE Treating Provider/Extender: Linwood Dibbles, Azarya Oconnell Weeks in Treatment: 4 Information Obtained from: Patient Chief Complaint Right plantar foot ulcer Electronic Signature(s) Signed: 11/09/2018 11:59:27 PM By: Lenda Kelp PA-C Entered By: Lenda Kelp on 11/09/2018 12:50:31 Steven Barnes ([id number]) -------------------------------------------------------------------------------- HPI Details Patient Name: Steven Barnes. Date of Service: 11/09/2018 12:45 PM Medical Record Number: [id number] Patient Account Number: [id number] Date of Birth/Sex: [birth year] (48 y.o. M) Treating RN: Curtis Sites Primary Care Provider: MUSE, ROCHELLE Other Clinician: Referring Provider: MUSE, ROCHELLE Treating Provider/Extender: Linwood Dibbles, Lurene Robley Weeks in Treatment: 4 History of Present Illness HPI Description: 10/10/18 on evaluation today patient presents for evaluation and clinic concerning an issue that he's been having with his right plantar foot at the third metatarsal head secondary to what he believes to be a nail that he stepped on July 2019. This was on the 17th of the month. Since that time is had a lot of issues with this area and he has not been able to get it to heal appropriately. Fortunately there does not appear to be any evidence of infection. Unfortunately though he doesn't have any significant pain due to neuropathy he also does not seem to be healing as appropriately as we would like. There is in fact a significant amount of necrotic tissue in the central portion of the wound this one does have some depth although there is  a significant amount of callous buildup surrounding the wound bed as well all of this is leading to a very hard to heal ulcer which desperately needs some debridement to clear this way and hopefully allow this a better chance to heal. No fevers, chills, nausea, or vomiting noted at this time. 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 10/17/18 on inspection today patient actually appears to be doing much better with the gentamicin cream being used on his wound. I did review his wound culture today which showed positive for Pseudomonas. Subsequently I think that likely the improvement he's seeing is due to the topical medication and since he's doing so well I really don't think that we're gonna need to initiate any oral medications at this point. The patient is very happy to hear this. In general I too am pleased that he seems to be doing so well. 10/24/18 on evaluation today patient's plantar foot ulcer actually appears to be doing fairly well at this time. He has been tolerating the dressing changes without complication. Overall this is just a very small opening remaining they cannot even really pack in the silver cell at this point. I think we may want to switch to Prisma. 11/09/17 on evaluation today patient actually appears to be doing excellent in regard to his plantar foot ulcer. In fact this appears to be completely healed which is excellent news. There's no sign of infection, fluid buildup undermine the wound site, or opening at this time. Overall a very pleased with how things have progressed. Electronic Signature(s) Signed: 11/09/2018 11:59:27 PM By: Lenda Kelp PA-C Entered By: Lenda Kelp on 11/09/2018 23:49:51 Steven Barnes ([id number]) -------------------------------------------------------------------------------- Physical Exam Details Patient Name: Steven Mannan A. Date of Service: 11/09/2018 12:45 PM  Medical Record Number:  [id number] Patient Account Number: [id number] Date of Birth/Sex: 05/20/1971 (48 y.o. M) Treating RN: Curtis Sitesorthy, Joanna Primary Care Provider: MUSE, ROCHELLE Other Clinician: Referring Provider: MUSE, ROCHELLE Treating Provider/Extender: STONE III, Simonne Boulos Weeks in Treatment: 4 Constitutional Well-nourished and well-hydrated in no acute distress. Respiratory normal breathing without difficulty. Psychiatric this patient is able to make decisions and demonstrates good insight into disease process. Alert and Oriented x 3. pleasant and cooperative. Notes Patient's wound bed shows complete epithelialization there is no tremendous callous buildup noted at this point I think that he is completely healed and likely is okay to go back to work at this point. Electronic Signature(s) Signed: 11/09/2018 11:59:27 PM By: Lenda KelpStone III, Malijah Lietz PA-C Entered By: Lenda KelpStone III, Dayton Sherr on 11/09/2018 23:50:20 Steven Barnes, Marquett A. ([id number]) -------------------------------------------------------------------------------- Physician Orders Details Patient Name: Steven Barnes, [name] Date of Service: 11/09/2018 12:45 PM Medical Record Number: [id number] Patient Account Number: [id number] Date of Birth/Sex: 08/30/1971 (48 y.o. M) Treating RN: Arnette NorrisBiell, Kristina Primary Care Provider: MUSE, Lorayne MarekOCHELLE Other Clinician: Referring Provider: MUSE, ROCHELLE Treating Provider/Extender: Linwood DibblesSTONE III, Sorcha Rotunno Weeks in Treatment: 4 Verbal / Phone Orders: No Diagnosis Coding ICD-10 Coding Code Description E11.621 Type 2 diabetes mellitus with foot ulcer L97.512 Non-pressure chronic ulcer of other part of right foot with fat layer exposed [facility] from [facility] Center - Apply corn/callous pad (double-layer) for the first week, then single layer for the next three weeks Electronic Signature(s) Signed: 11/09/2018 3:53:38 PM By: Arnette NorrisBiell, Kristina Signed: 11/09/2018 11:59:27 PM By: Lenda KelpStone III, Arris Meyn PA-C Entered By: Arnette NorrisBiell, Kristina on  11/09/2018 13:05:47 Steven Barnes, Theron A. ([id number]) -------------------------------------------------------------------------------- Problem List Details Patient Name: Steven Barnes, [name] Date of Service: 11/09/2018 12:45 PM Medical Record Number: [id number] Patient Account Number: [id number] Date of Birth/Sex: 02/16/1971 (48 y.o. M) Treating RN: Curtis Sitesorthy, Joanna Primary Care Provider: MUSE, Lorayne MarekOCHELLE Other Clinician: Referring Provider: MUSE, ROCHELLE Treating Provider/Extender: Linwood DibblesSTONE III, Taleen Prosser Weeks in Treatment: 4 Active Problems ICD-10 Evaluated Encounter Code Description Active Date Today Diagnosis E11.621 Type 2 diabetes mellitus with foot ulcer 10/10/2018 No Yes L97.512 Non-pressure chronic ulcer of other part of right foot with fat 10/10/2018 No Yes layer exposed Inactive Problems Resolved Problems Electronic Signature(s) Signed: 11/09/2018 11:59:27 PM By: Lenda KelpStone III, Satya Bohall PA-C Entered By: Lenda KelpStone III, Shaarav Ripple on 11/09/2018 12:49:43 Steven Barnes, Fredrico A. ([id number]) -------------------------------------------------------------------------------- Progress Note Details Patient Name: Steven Barnes, [name] Date of Service: 11/09/2018 12:45 PM Medical Record Number: [id number] Patient Account Number: [id number] Date of Birth/Sex: 10/05/1971 (48 y.o. M) Treating RN: Curtis Sitesorthy, Joanna Primary Care Provider: MUSE, ROCHELLE Other Clinician: Referring Provider: MUSE, ROCHELLE Treating Provider/Extender: Linwood DibblesSTONE III, Rondo Spittler Weeks in Treatment: 4 Subjective Chief Complaint Information obtained from Patient Right plantar foot ulcer History of Present Illness (HPI) 10/10/18 on evaluation today patient presents for evaluation and clinic concerning an issue that he's been having with his right plantar foot at the third metatarsal head secondary to what he believes to be a nail that he stepped on July 2019. This was on the 17th of the month. Since that time is had a lot of issues with this area and he has not been able to get it to  heal appropriately. Fortunately there does not appear to be any evidence of infection. Unfortunately though he doesn't have any significant pain due to neuropathy he also does not seem to be healing as appropriately as we would like. There is in fact a significant amount of necrotic tissue in the central portion of the wound this one does  have some depth although there is a significant amount of callous buildup surrounding the wound bed as well all of this is leading to a very hard to heal ulcer which desperately needs some debridement to clear this way and hopefully allow this a better chance to heal. No fevers, chills, nausea, or vomiting noted at this time. 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 10/17/18 on inspection today patient actually appears to be doing much better with the gentamicin cream being used on his wound. I did review his wound culture today which showed positive for Pseudomonas. Subsequently I think that likely the improvement he's seeing is due to the topical medication and since he's doing so well I really don't think that we're gonna need to initiate any oral medications at this point. The patient is very happy to hear this. In general I too am pleased that he seems to be doing so well. 10/24/18 on evaluation today patient's plantar foot ulcer actually appears to be doing fairly well at this time. He has been tolerating the dressing changes without complication. Overall this is just a very small opening remaining they cannot even really pack in the silver cell at this point. I think we may want to switch to Prisma. 11/09/17 on evaluation today patient actually appears to be doing excellent in regard to his plantar foot ulcer. In fact this appears to be completely healed which is excellent news. There's no sign of infection, fluid buildup undermine the wound site, or opening at this time. Overall a very pleased with how things  have progressed. Patient History Information obtained from Patient. Family History Diabetes - Mother, Heart Disease - Mother, Hypertension - Mother,Father, Kidney Disease - Father, Lung Disease - Mother, Stroke - Mother, Thyroid Problems - Father, Tuberculosis - Maternal Grandparents, No family history of Cancer, Hereditary Spherocytosis. Social History Former smoker - ended on 11/08/2018, Marital Status - Married, Alcohol Use - Rarely, Drug Use - No History, Caffeine Use - Daily. Review of Systems (ROS) Constitutional Symptoms (General Health) Denies complaints or symptoms of Fever, Chills. Respiratory CHARBEL, MCCLEAF ([id number]) The patient has no complaints or symptoms. Cardiovascular The patient has no complaints or symptoms. Psychiatric The patient has no complaints or symptoms. Objective Constitutional Well-nourished and well-hydrated in no acute distress. Vitals Time Taken: 12:43 PM, Height: 72 in, Weight: 235 lbs, BMI: 31.9, Temperature: 98.1 F, Pulse: 86 bpm, Respiratory Rate: 16 breaths/min, Blood Pressure: 117/72 mmHg. Respiratory normal breathing without difficulty. Psychiatric this patient is able to make decisions and demonstrates good insight into disease process. Alert and Oriented x 3. pleasant and cooperative. General Notes: Patient's wound bed shows complete epithelialization there is no tremendous callous buildup noted at this point I think that he is completely healed and likely is okay to go back to work at this point. Integumentary (Hair, Skin) Wound #1 status is Healed - Epithelialized. Original cause of wound was Trauma. The wound is located on the Right Metatarsal head third. The wound measures 0cm length x 0cm width x 0cm depth; 0cm^2 area and 0cm^3 volume. There is Fat Layer (Subcutaneous Tissue) Exposed exposed. There is no tunneling or undermining noted. There is a medium amount of serous drainage noted. The wound margin is distinct with the outline  attached to the wound base. There is small (1-33%) pale granulation within the wound bed. There is a large (67-100%) amount of necrotic tissue within the wound bed including Eschar. The periwound skin appearance  exhibited: Callus. The periwound skin appearance did not exhibit: Crepitus, Excoriation, Induration, Rash, Scarring, Dry/Scaly, Maceration, Atrophie Blanche, Cyanosis, Ecchymosis, Hemosiderin Staining, Mottled, Pallor, Rubor, Erythema. Periwound temperature was noted as No Abnormality. Assessment Active Problems ICD-10 Type 2 diabetes mellitus with foot ulcer Non-pressure chronic ulcer of other part of right foot with fat layer exposed Steven Barnes, [name] ([id number]) [facility] - Apply corn/callous pad (double-layer) for the first week, then single layer for the next three weeks My suggestion currently is gonna be that we go ahead and proceed with discharge from wound care services. I did suggest that he continue to appropriately offload the area using diabetic shoes which he did get diabetic workboots which used to get with in the next day. Subsequently he is also going to use [place] callous pads for the next month in order to help offload the area and keep things safe in that regard. He is in agreement that plan. If anything changes or worsens meantime he will contact the office and let me know otherwise I did also suggest diabetic shoes for when he is not working this is something that he can talk to his primary care provider about I did give him information about how to get these ordered as well so that she would know how to proceed. If anything changes they will let me know. Electronic Signature(s) Signed: 11/09/2018 11:59:27 PM By: Lenda KelpStone III, Julee Stoll PA-C Entered By: Lenda KelpStone III, Margaree Sandhu on 11/09/2018 23:50:35 Steven Barnes, Navon A. ([id number]) -------------------------------------------------------------------------------- ROS/PFSH  Details Patient Name: Steven Barnes, Vivian A. Date of Service: 11/09/2018 12:45 PM Medical Record Number: [id number] Patient Account Number: [id number] Date of Birth/Sex: 05/23/1971 (48 y.o. M) Treating RN: Curtis Sitesorthy, Joanna Primary Care Provider: MUSE, ROCHELLE Other Clinician: Referring Provider: MUSE, ROCHELLE Treating Provider/Extender: STONE III, Maygen Sirico Weeks in Treatment: 4 Information Obtained From Patient Wound History Do you currently have one or more open woundso No Have you tested positive for osteomyelitis (bone infection)o No Have you had any tests for circulation on your legso No Have you had other problems associated with your woundso Infection Constitutional Symptoms (General Health) Complaints and Symptoms: Negative for: Fever; Chills Eyes Medical History: Negative for: Cataracts; Glaucoma; Optic Neuritis Ear/Nose/Mouth/Throat Medical History: Negative for: Chronic sinus problems/congestion; Middle ear problems Hematologic/Lymphatic Medical History: Negative for: Anemia; Hemophilia; Human Immunodeficiency Virus; Lymphedema; Sickle Cell Disease Respiratory Complaints and Symptoms: No Complaints or Symptoms Medical History: Negative for: Aspiration; Asthma; Chronic Obstructive Pulmonary Disease (COPD); Pneumothorax; Sleep Apnea; Tuberculosis Cardiovascular Complaints and Symptoms: No Complaints or Symptoms Medical History: Positive for: Hypertension Negative for: Angina; Arrhythmia; Congestive Heart Failure; Coronary Artery Disease; Deep Vein Thrombosis; Hypotension; Myocardial Infarction; Peripheral Arterial Disease; Peripheral Venous Disease; Phlebitis; Vasculitis Gastrointestinal Steven Barnes, [name] ([id number]) Medical History: Negative for: Cirrhosis ; Colitis; Crohnos; Hepatitis A; Hepatitis B; Hepatitis C Endocrine Medical History: Positive for: Type II Diabetes Negative for: Type I Diabetes Treated with: Oral agents Blood sugar tested every day: Yes Tested :  2/day Genitourinary Medical History: Negative for: End Stage Renal Disease Immunological Medical History: Negative for: Lupus Erythematosus; Raynaudos; Scleroderma Integumentary (Skin) Medical History: Negative for: History of Burn; History of pressure wounds Musculoskeletal Medical History: Negative for: Gout; Rheumatoid Arthritis; Osteoarthritis; Osteomyelitis Neurologic Medical History: Negative for: Dementia; Neuropathy; Quadriplegia; Paraplegia; Seizure Disorder Oncologic Medical History: Negative for: Received Chemotherapy; Received Radiation Psychiatric Complaints and Symptoms: No Complaints or Symptoms Medical History: Negative for: Anorexia/bulimia; Confinement Anxiety Immunizations Pneumococcal Vaccine: Received Pneumococcal Vaccination: No Implantable Devices Family and  Social History Cancer: No; Diabetes: Yes - Mother; Heart Disease: Yes - Mother; Hereditary Spherocytosis: No; Hypertension: Yes - Mother,Father; Kidney Disease: Yes - Father; Lung Disease: Yes - Mother; Stroke: Yes - Mother; Thyroid Problems: Yes Steven Barnes, Steven Barnes ([id number]) Father; Tuberculosis: Yes - Maternal Grandparents; Former smoker - ended on 11/08/2018; Marital Status - Married; Alcohol Use: Rarely; Drug Use: No History; Caffeine Use: Daily; Financial Concerns: Yes; Food, Clothing or Shelter Needs: No; Support System Lacking: No; Transportation Concerns: No; Advanced Directives: No; Patient wants information on Advanced Directives; Advanced Directives information was provided to patient; Do not resuscitate: No; Living Will: No; Medical Power of Attorney: No Physician Affirmation I have reviewed and agree with the above information. Electronic Signature(s) Signed: 11/09/2018 11:59:27 PM By: Lenda Kelp PA-C Signed: 11/10/2018 5:14:43 PM By: Curtis Sites Entered By: Lenda Kelp on 11/09/2018 23:50:11 Steven Barnes  ([id number]) -------------------------------------------------------------------------------- SuperBill Details Patient Name: Steven Mannan A. Date of Service: 11/09/2018 Medical Record Number: [id number] Patient Account Number: [id number] Date of Birth/Sex: 11/06/1971 (48 y.o. M) Treating RN: Curtis Sites Primary Care Provider: MUSE, Lorayne Marek Other Clinician: Referring Provider: MUSE, ROCHELLE Treating Provider/Extender: Linwood Dibbles, Alayne Estrella Weeks in Treatment: 4 Diagnosis Coding ICD-10 Codes Code Description E11.621 Type 2 diabetes mellitus with foot ulcer L97.512 Non-pressure chronic ulcer of other part of right foot with fat layer exposed Facility Procedures CPT4 Code: [id number] Description: [phone] - WOUND CARE VISIT-LEV 2 EST PT Modifier: Quantity: 1 Physician Procedures CPT4 Code: [id number] Description: [id number] - WC PHYS LEVEL 3 - EST PT ICD-10 Diagnosis Description E11.621 Type 2 diabetes mellitus with foot ulcer L97.512 Non-pressure chronic ulcer of other part of right foot with fat Modifier: layer exposed Quantity: 1 Electronic Signature(s) Signed: 11/09/2018 11:59:27 PM By: Lenda Kelp PA-C Entered By: Lenda Kelp on 11/09/2018 23:50:45

## 2019-04-25 ENCOUNTER — Other Ambulatory Visit: Payer: Self-pay

## 2019-04-25 ENCOUNTER — Emergency Department (HOSPITAL_COMMUNITY): Payer: BC Managed Care – PPO

## 2019-04-25 ENCOUNTER — Emergency Department (HOSPITAL_COMMUNITY)
Admission: EM | Admit: 2019-04-25 | Discharge: 2019-04-25 | Disposition: A | Discharge status: Home / Self Care | Payer: BC Managed Care – PPO | Attending: Emergency Medicine | Admitting: Emergency Medicine

## 2019-04-25 ENCOUNTER — Encounter (HOSPITAL_COMMUNITY): Payer: Self-pay | Admitting: Emergency Medicine

## 2019-04-25 DIAGNOSIS — E119 Type 2 diabetes mellitus without complications: Secondary | ICD-10-CM | POA: Insufficient documentation

## 2019-04-25 DIAGNOSIS — I10 Essential (primary) hypertension: Secondary | ICD-10-CM | POA: Insufficient documentation

## 2019-04-25 DIAGNOSIS — Y92009 Unspecified place in unspecified non-institutional (private) residence as the place of occurrence of the external cause: Secondary | ICD-10-CM | POA: Insufficient documentation

## 2019-04-25 DIAGNOSIS — Z7984 Long term (current) use of oral hypoglycemic drugs: Secondary | ICD-10-CM | POA: Diagnosis not present

## 2019-04-25 DIAGNOSIS — Y999 Unspecified external cause status: Secondary | ICD-10-CM | POA: Diagnosis not present

## 2019-04-25 DIAGNOSIS — F17210 Nicotine dependence, cigarettes, uncomplicated: Secondary | ICD-10-CM | POA: Insufficient documentation

## 2019-04-25 DIAGNOSIS — Y939 Activity, unspecified: Secondary | ICD-10-CM | POA: Diagnosis not present

## 2019-04-25 DIAGNOSIS — S91131A Puncture wound without foreign body of right great toe without damage to nail, initial encounter: Secondary | ICD-10-CM

## 2019-04-25 DIAGNOSIS — W450XXA Nail entering through skin, initial encounter: Secondary | ICD-10-CM | POA: Diagnosis not present

## 2019-04-25 DIAGNOSIS — S99921A Unspecified injury of right foot, initial encounter: Secondary | ICD-10-CM | POA: Diagnosis present

## 2019-04-25 MED ORDER — CIPROFLOXACIN HCL 500 MG PO TABS: 500.0000 mg | ORAL_TABLET | Freq: Two times a day (BID) | ORAL | 0 refills | Status: DC

## 2019-04-25 NOTE — ED Notes (Signed)
Have irrigated wound thoroughly and dressed it. Pt not complaining of any pain.

## 2019-04-25 NOTE — ED Triage Notes (Signed)
Patient reports steeping on a nail, went into his big toe.

## 2019-04-25 NOTE — Discharge Instructions (Addendum)
Please read instructions below.  Keep your wound clean and covered.  Clean your wound daily with soap and water.  You can also soak it for 10 minutes at a time, in warm soapy water, once daily.  Apply a clean dry dressing afterwards.  Your foot as much as possible to help with swelling. Take the antibiotic, ciprofloxacin, twice daily until gone. Follow-up closely with your primary care provider for wound recheck in 2 days. Return to the ER for fever, pus draining from wound, new redness around the wound, or new or worsening symptoms.

## 2019-04-25 NOTE — ED Provider Notes (Signed)
[facility] EMERGENCY DEPARTMENT Provider Note   CSN: [id number] Arrival date & time: 04/25/19  1754    History   Chief Complaint Chief Complaint  Patient presents with  . Foot Injury    HPI Steven Barnes is a 48 y.o. male past medical history of diabetes, hypertension, presenting to the emergency department with complaint of wound to his right first toe.  Patient states about an hour prior to arrival, he stepped on a nail in his house and it went through his shoe into his big toe.  He was wearing a tennis shoe.  He states he has a wound to the underside of his toe and had some bleeding.  He presents for evaluation.  He has some mild pain though no difficulty moving his toe.  His tetanus was updated last year.  He states his blood sugars are well controlled and is compliant with his medications.     The history is provided by the patient.    Past Medical History:  Diagnosis Date  . Chronic knee pain   . Diabetes mellitus without complication (HCC)   . Hypertension   . Knee arthropathy   . Narcotic abuse (HCC)     There are no active problems to display for this patient.   Past Surgical History:  Procedure Laterality Date  . KNEE ARTHROSCOPY AND ARTHROTOMY    . KNEE SURGERY          Home Medications    Prior to Admission medications   Medication Sig Start Date End Date Taking? Authorizing Provider  ciprofloxacin (CIPRO) 500 MG tablet Take 1 tablet (500 mg total) by mouth 2 (two) times daily. 04/25/19   Robinson, [place] N, PA-C  doxycycline (VIBRAMYCIN) 100 MG capsule Take 1 capsule (100 mg total) by mouth 2 (two) times daily. 10/06/18   Eber HongMiller, Brian, MD  lisinopril (PRINIVIL,ZESTRIL) 10 MG tablet Take 10 mg by mouth every morning.    [provider]  loratadine (CLARITIN) 10 MG tablet Take 10 mg by mouth at bedtime.    [provider]  metFORMIN (GLUCOPHAGE-XR) 500 MG 24 hr tablet Take 500-1,000 mg by mouth See admin instructions. 1000mg  in the  morning and 500mg  at bedtime    [provider]  Multiple Vitamin (MULTIVITAMIN WITH MINERALS) TABS tablet Take 1 tablet by mouth at bedtime.    [provider]  simvastatin (ZOCOR) 10 MG tablet Take 10 mg by mouth at bedtime.    [provider]    Family History Family History  Problem Relation Age of Onset  . Hypertension Father     Social History Social History   Tobacco Use  . Smoking status: Current Every Day Smoker    Packs/day: 1.00    Years: 15.00    Pack years: 15.00    Types: Cigarettes  . Smokeless tobacco: Never Used  Substance Use Topics  . Alcohol use: No  . Drug use: No     Allergies   Hornet venom and Aspirin   Review of Systems Review of Systems  Skin: Positive for wound.  All other systems reviewed and are negative.    Physical Exam Updated Vital Signs BP (!) 153/88 (BP Location: Right Arm)   Pulse 91   Temp 98.3 F (36.8 C) (Oral)   Resp 20   Ht 6' (1.829 m)   Wt 107 kg   SpO2 95%   BMI 32.01 kg/m   Physical Exam Vitals signs and nursing note reviewed.  Constitutional:  General: He is not in acute distress.    Appearance: He is well-developed.  HENT:     Head: Normocephalic and atraumatic.  Eyes:     Conjunctiva/sclera: Conjunctivae normal.  Cardiovascular:     Rate and Rhythm: Normal rate.  Pulmonary:     Effort: Pulmonary effort is normal.  Musculoskeletal:     Comments: Right first toe with puncture wound on the plantar aspect, overlying the proximal distal phalanx.  Not actively bleeding.  Mild tenderness.  No obvious foreign bodies.  Active range of motion of the IP joint of the toe. Normal sensation, intact DP pulses. Foot is warm and well perfused.  Neurological:     Mental Status: He is alert.  Psychiatric:        Mood and Affect: Mood normal.        Behavior: Behavior normal.      ED Treatments / Results  Labs (all labs ordered are listed, but only abnormal results are displayed)  Labs Reviewed - No data to display  EKG None  Radiology Dg Toe Great Right  Result Date: 04/25/2019 CLINICAL DATA:  Puncture wound. Stepped on a screw earlier today puncturing big toe. EXAM: RIGHT GREAT TOE COMPARISON:  Foot radiograph 10/06/2018 FINDINGS: No radiopaque foreign body. No obvious soft tissue gas, however portions of the plantar soft tissues are excluded from the lateral field of view. No fracture, dislocation, or bony destructive change. Chronic degenerative change of the first metatarsal phalangeal joint. IMPRESSION: No radiopaque foreign body or acute osseous abnormality. Electronically Signed   By: Keith Rake M.D.   On: 04/25/2019 18:59    Procedures Procedures (including critical care time)  Medications Ordered in ED Medications - No data to display   Initial Impression / Assessment and Plan / ED Course  I have reviewed the triage vital signs and the nursing notes.  Pertinent labs & imaging results that were available during my care of the patient were reviewed by me and considered in my medical decision making (see chart for details).        Patient with puncture wound to the right first toe with the nail, went through his tennis shoe today.  Patient is diabetic, however reports compliance with medications and states his blood sugars tend to be well controlled.  Minimal pain on exam.  Neurovascularly intact.  No obvious retained foreign bodies.  X-ray negative for bony injury or retained foreign body. Wound copiously irrigated in the ED and dressed. Tetanus up to date. Will discharge with cipro to cover pseudomonas as pt was wearing tennis shoe. Instructed strict wound care, close pcp follow up for wound check. Counseled on importance of diabetes management and low-carb and sugar diet regarding wound healing. Pt safe for discharge.  Discussed results, findings, treatment and follow up. Patient advised of return precautions. Patient verbalized understanding and  agreed with plan.   Final Clinical Impressions(s) / ED Diagnoses   Final diagnoses:  Puncture wound of right great toe without foreign body without damage to nail, initial encounter    ED Discharge Orders         Ordered    ciprofloxacin (CIPRO) 500 MG tablet  2 times daily     04/25/19 1916           Robinson, [place] N, PA-C 04/25/19 1944    Milton Ferguson, MD 04/26/19 [phone]

## 2019-09-23 ENCOUNTER — Other Ambulatory Visit: Payer: Self-pay

## 2019-09-23 ENCOUNTER — Emergency Department (HOSPITAL_COMMUNITY): Payer: BC Managed Care – PPO

## 2019-09-23 ENCOUNTER — Emergency Department (HOSPITAL_COMMUNITY)
Admission: EM | Admit: 2019-09-23 | Discharge: 2019-09-23 | Disposition: A | Discharge status: Home / Self Care | Payer: BC Managed Care – PPO | Attending: Emergency Medicine | Admitting: Emergency Medicine

## 2019-09-23 ENCOUNTER — Encounter (HOSPITAL_COMMUNITY): Payer: Self-pay | Admitting: *Deleted

## 2019-09-23 DIAGNOSIS — M25562 Pain in left knee: Secondary | ICD-10-CM | POA: Insufficient documentation

## 2019-09-23 DIAGNOSIS — Z7984 Long term (current) use of oral hypoglycemic drugs: Secondary | ICD-10-CM | POA: Insufficient documentation

## 2019-09-23 DIAGNOSIS — E119 Type 2 diabetes mellitus without complications: Secondary | ICD-10-CM | POA: Insufficient documentation

## 2019-09-23 DIAGNOSIS — F17210 Nicotine dependence, cigarettes, uncomplicated: Secondary | ICD-10-CM | POA: Diagnosis not present

## 2019-09-23 DIAGNOSIS — Z79899 Other long term (current) drug therapy: Secondary | ICD-10-CM | POA: Insufficient documentation

## 2019-09-23 DIAGNOSIS — I10 Essential (primary) hypertension: Secondary | ICD-10-CM | POA: Insufficient documentation

## 2019-09-23 MED ORDER — NAPROXEN 500 MG PO TABS: 500.0000 mg | ORAL_TABLET | Freq: Two times a day (BID) | ORAL | 0 refills | Status: DC

## 2019-09-23 NOTE — ED Triage Notes (Signed)
Pt with left knee since Friday, able to put some weight it.  Pt states he worked all week, denies any known injury to left knee.

## 2019-09-23 NOTE — ED Provider Notes (Signed)
[facility] EMERGENCY DEPARTMENT Provider Note   CSN: [id number] Arrival date & time: 09/23/19  1042     History   Chief Complaint Chief Complaint  Patient presents with   Knee Pain    HPI Steven Barnes is a 48 y.o. male with a hx of hypertension, diabetes mellitus, narcotic abuse, chronic knee pain, & prior left knee surgery who presents to the ED with complaints of left knee pain over the past couple of days. 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 He woke up 2 mornings prior with discomfort & swelling in the left knee. Discomfort is aching/throbbing, constant, worse with movement/ weightbearing, no alleviating factors. No intervention PTA. Hx of surgery several years ago by Dr. Romeo Apple on the left knee with hardware placement. Patient denies fever, chills, redness, warmth, numbness, or tingling.      HPI  Past Medical History:  Diagnosis Date   Chronic knee pain    Diabetes mellitus without complication (HCC)    Hypertension    Knee arthropathy    Narcotic abuse (HCC)     There are no active problems to display for this patient.   Past Surgical History:  Procedure Laterality Date   KNEE ARTHROSCOPY AND ARTHROTOMY     KNEE SURGERY          Home Medications    Prior to Admission medications   Medication Sig Start Date End Date Taking? Authorizing Provider  ciprofloxacin (CIPRO) 500 MG tablet Take 1 tablet (500 mg total) by mouth 2 (two) times daily. 04/25/19   Robinson, [place] N, PA-C  doxycycline (VIBRAMYCIN) 100 MG capsule Take 1 capsule (100 mg total) by mouth 2 (two) times daily. 10/06/18   Eber Hong, MD  lisinopril (PRINIVIL,ZESTRIL) 10 MG tablet Take 10 mg by mouth every morning.    [provider]  loratadine (CLARITIN) 10 MG tablet Take 10 mg by mouth at bedtime.    [provider]  metFORMIN (GLUCOPHAGE-XR) 500 MG 24 hr tablet Take 500-1,000 mg by mouth See admin instructions. 1000mg  in the morning and 500mg  at bedtime    [provider]  Multiple Vitamin (MULTIVITAMIN WITH MINERALS) TABS tablet Take 1 tablet by mouth at bedtime.    [provider]  simvastatin (ZOCOR) 10 MG tablet Take 10 mg by mouth at bedtime.    [provider]    Family History Family History  Problem Relation Age of Onset   Hypertension Father     Social History Social History   Tobacco Use   Smoking status: Current Every Day Smoker    Packs/day: 1.00    Years: 15.00    Pack years: 15.00    Types: Cigarettes   Smokeless tobacco: Never Used  Substance Use Topics   Alcohol use: No   Drug use: No     Allergies   Hornet venom and Aspirin   Review of Systems Review of Systems  Constitutional: Negative for chills and fever.  Respiratory: Negative for shortness of breath.   Cardiovascular: Negative for chest pain.  Gastrointestinal: Negative for abdominal pain and vomiting.  Musculoskeletal: Positive for arthralgias and joint swelling.  Skin: Negative for color change, rash and wound.  Neurological: Negative for weakness and numbness.   Physical Exam Updated Vital Signs BP (!) 162/135 (BP Location: Right Arm)  Pulse 98    Temp 98.6 F (37 C) (Oral)    SpO2 97%   Physical Exam Vitals signs and nursing note reviewed.  Constitutional:      General: He is not in acute distress.    Appearance: He is not ill-appearing or toxic-appearing.  HENT:     Head: Normocephalic and atraumatic.  Cardiovascular:     Pulses:          Dorsalis pedis pulses are 2+ on the right side and 2+ on the left side.       Posterior tibial pulses are 2+ on the right side and 2+ on the left side.  Pulmonary:     Effort: Pulmonary effort is normal.  Musculoskeletal:     Comments: Lower extremities: L knee with swelling & small joint effusion present.  No obvious deformity, erythema, ecchymosis, warmth, or open wounds. Patient has intact AROM to bilateral hips,ankles, digits, & R knee. L knee with limited active flexion- able to flex to about 90 degrees, able to fully extend. Tender to palpation primarily to the medial joint line, but also just medial/lateral to the patella. Lower extremities are otherwise nontender. Compartments are soft. No calf tenderness.   Skin:    General: Skin is warm and dry.     Capillary Refill: Capillary refill takes less than 2 seconds.  Neurological:     Mental Status: He is alert.     Comments: Alert. Clear speech. Sensation grossly intact to bilateral lower extremities. 5/5 strength with plantar/dorsiflexion bilaterally. Patient ambulatory with antalgic gait.   Psychiatric:        Mood and Affect: Mood normal.        Behavior: Behavior normal.      ED Treatments / Results  Labs (all labs ordered are listed, but only abnormal results are displayed) Labs Reviewed - No data to display  EKG None  Radiology Dg Knee Complete 4 Views Left  Result Date: 09/23/2019 CLINICAL DATA:  Injury. Left knee pain for the past 2 days. No known EXAM: LEFT KNEE - COMPLETE 4+ VIEW COMPARISON:  None. FINDINGS: Moderate to marked medial joint space narrowing and associated spur formation. Mild lateral joint space widening. Mild lateral spur formation. Moderate patellofemoral spur formation. Small to moderate-sized effusion. No fracture or dislocation seen. IMPRESSION: 1. Tricompartmental degenerative changes, most pronounced in the medial and patellofemoral compartments. 2. Small to moderate-sized effusion. Electronically Signed   By: Claudie Revering M.D.   On: 09/23/2019 12:05    Procedures Procedures (including critical care time) SPLINT APPLICATION Date/Time: 52:84 PM Authorized by: Kennith Maes Consent: Verbal consent obtained. Risks and benefits: risks, benefits and alternatives were discussed Consent given by:  patient Splint applied by: RN Location details: LLE Splint type: knee immobilizer Supplies used: knee immobilizer.  Post-procedure: The splinted body part was neurovascularly unchanged following the procedure. Patient tolerance: Patient tolerated the procedure well with no immediate complications.  Medications Ordered in ED Medications - No data to display   Initial Impression / Assessment and Plan / ED Course  I have reviewed the triage vital signs and the nursing notes.  Pertinent labs & imaging results that were available during my care of the patient were reviewed by me and considered in my medical decision making (see chart for details).   Patient presents to the ED w/ complaints of L knee pain for past few days. No direct trauma, does a lot of position changing/movement with his job. Nontoxic appearing, vitals WNL with the exception  of elevated BP- doubt HTN emergency- PCP recheck. No fever, erythema, or warmth- does not seem consistent w/ septic joint. No calf tenderness or edema- doubt DVT. Xray w/o fx/dislocation. Xray with tricompartmental degenerative changes, most pronounced in the medial and patellofemoral compartments and small to moderate-sized effusion. Considering arthritis vs. gout- no hx of gout & not classic for it, will treat with Naproxen (last creatinine WNL on chart review), provide knee immobilizer, and have patient follow up with his orthopedic surgeon. I discussed results, treatment plan, need for follow-up, and return precautions with the patient. Provided opportunity for questions, patient confirmed understanding and is in agreement with plan.   Final Clinical Impressions(s) / ED Diagnoses   Final diagnoses:  Acute pain of left knee    ED Discharge Orders         Ordered    naproxen (NAPROSYN) 500 MG tablet  2 times daily     09/23/19 [address]1214           Ellyse Rotolo R, PA-C 09/23/19 1224    Donnetta Hutchingook, Brian, MD 09/26/19 [phone]

## 2019-09-23 NOTE — ED Notes (Signed)
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

## 2019-09-23 NOTE — Discharge Instructions (Addendum)
Please read and follow all provided instructions.  You have been seen today for left knee pain.   Tests performed today include: An x-ray of the affected area - does NOT show any broken bones or dislocations-- it does show significant arthritis and some fluid around the knee.  Vital signs. See below for your results today.   Home care instructions: -- *PRICE in the first 24-48 hours: Protect (with brace, splint, sling), if given by your provider Rest Ice- Do not apply ice pack directly to your skin, place towel or similar between your skin and ice/ice pack. Apply ice for 20 min, then remove for 40 min while awake Compression- Wear brace, elastic bandage, splint as directed by your provider Elevate affected extremity above the level of your heart when not walking around for the first 24-48 hours   Medications:  - Naproxen is a nonsteroidal anti-inflammatory medication that will help with pain and swelling. Be sure to take this medication as prescribed with food, 1 pill every 12 hours,  It should be taken with food, as it can cause stomach upset, and more seriously, stomach bleeding. Do not take other nonsteroidal anti-inflammatory medications with this such as Advil, Motrin, Aleve, Mobic, Goodie Powder, or Motrin.    You make take Tylenol per over the counter dosing with these medications.   We have prescribed you new medication(s) today. Discuss the medications prescribed today with your pharmacist as they can have adverse effects and interactions with your other medicines including over the counter and prescribed medications. Seek medical evaluation if you start to experience new or abnormal symptoms after taking one of these medicines, seek care immediately if you start to experience difficulty breathing, feeling of your throat closing, facial swelling, or rash as these could be indications of a more serious allergic reaction   Follow-up instructions: Please follow-up with Dr. Ruthe Mannan  office within the next 3-5 days.   Return instructions:  Please return if your digits or extremity are numb or tingling, appear gray or blue, or you have severe pain (also elevate the extremity and loosen splint or wrap if you were given one) Please return if you have redness or fevers.  Please return to the Emergency Department if you experience worsening symptoms.  Please return if you have any other emergent concerns. Additional Information:  Your vital signs today were: BP (!) 162/135 (BP Location: Right Arm)    Pulse 98    Temp 98.6 F (37 C) (Oral)    SpO2 97%  If your blood pressure (BP) was elevated above 135/85 this visit, please have this repeated by your doctor within one month. ---------------

## 2019-11-08 ENCOUNTER — Ambulatory Visit: Payer: BC Managed Care – PPO | Admitting: Podiatry

## 2019-11-08 ENCOUNTER — Encounter: Payer: Self-pay | Admitting: Podiatry

## 2019-11-08 ENCOUNTER — Other Ambulatory Visit: Payer: Self-pay

## 2019-11-08 ENCOUNTER — Ambulatory Visit (INDEPENDENT_AMBULATORY_CARE_PROVIDER_SITE_OTHER): Payer: BC Managed Care – PPO

## 2019-11-08 DIAGNOSIS — L97519 Non-pressure chronic ulcer of other part of right foot with unspecified severity: Secondary | ICD-10-CM | POA: Diagnosis not present

## 2019-11-08 DIAGNOSIS — M216X1 Other acquired deformities of right foot: Secondary | ICD-10-CM | POA: Diagnosis not present

## 2019-11-08 DIAGNOSIS — B351 Tinea unguium: Secondary | ICD-10-CM

## 2019-11-08 DIAGNOSIS — M79675 Pain in left toe(s): Secondary | ICD-10-CM

## 2019-11-08 DIAGNOSIS — M79674 Pain in right toe(s): Secondary | ICD-10-CM

## 2019-11-08 DIAGNOSIS — E1149 Type 2 diabetes mellitus with other diabetic neurological complication: Secondary | ICD-10-CM | POA: Diagnosis not present

## 2019-11-08 MED ORDER — CEPHALEXIN 500 MG PO CAPS: 500.0000 mg | ORAL_CAPSULE | Freq: Three times a day (TID) | ORAL | 2 refills | Status: DC

## 2019-11-08 MED ORDER — SILVER SULFADIAZINE 1 % EX CREA: 1.0000 "application " | TOPICAL_CREAM | Freq: Every day | CUTANEOUS | 0 refills | Status: DC

## 2019-11-08 NOTE — Patient Instructions (Addendum)
If was nice to meet you today. If you have any questions or any further concerns, please feel fee to give me a call. You can call our office at [phone] or please feel fee to send me a message through [facility].   Place a small piece of the prisma dressing in the wound after applying a small amount of saline. Cover with guaze. Change daily. You can wash the foot with soap and water daily before applying the Prisma but dry thoroughly before doing so.

## 2019-11-08 NOTE — Progress Notes (Signed)
Subjective:   Patient ID: Steven Barnes, male   DOB: 48 y.o.   MRN: [id number]   HPI 48 year old male presents the office today for concerns of a wound on the right foot.  He states that he thinks this is occurring because he is walking differently due to his left knee putting more pressure on the right foot.  He also states he did stepped on nail back in 2018.  Previous had a wound to this area but it did heal.  He states that the wound did open back up over the last couple months and he said no treatment for this.  Denies any drainage or pus.  His wife has been putting what appears to be gentamicin cream into the wound daily.  Denies any swelling or redness.  Also asking for the nails be trimmed today's are thickened elongated he cannot do them self.   Review of Systems  All other systems reviewed and are negative.  Past Medical History:  Diagnosis Date  . Chronic knee pain   . Diabetes mellitus without complication (HCC)   . Hypertension   . Knee arthropathy   . Narcotic abuse [facility])     Past Surgical History:  Procedure Laterality Date  . KNEE ARTHROSCOPY AND ARTHROTOMY    . KNEE SURGERY       Current Outpatient Medications:  .  gentamicin cream (GARAMYCIN) 0.1 %, Apply 1 application topically 3 (three) times daily., Disp: , Rfl:  .  Loratadine (CLARITIN PO), Take by mouth., Disp: , Rfl:  .  oxyCODONE-acetaminophen (PERCOCET) 5-325 mg TABS tablet, , Disp: , Rfl:  .  cephALEXin (KEFLEX) 500 MG capsule, Take 1 capsule (500 mg total) by mouth 3 (three) times daily., Disp: 30 capsule, Rfl: 2 .  lisinopril (PRINIVIL,ZESTRIL) 10 MG tablet, Take 10 mg by mouth every morning., Disp: , Rfl:  .  metFORMIN (GLUCOPHAGE-XR) 500 MG 24 hr tablet, Take 500-1,000 mg by mouth See admin instructions. 1000mg  in the morning and 500mg  at bedtime, Disp: , Rfl:  .  Multiple Vitamin (MULTIVITAMIN WITH MINERALS) TABS tablet, Take 1 tablet by mouth at bedtime., Disp: , Rfl:  .  silver sulfADIAZINE  (SILVADENE) 1 % cream, Apply 1 application topically daily., Disp: 50 g, Rfl: 0 .  simvastatin (ZOCOR) 10 MG tablet, Take 10 mg by mouth at bedtime., Disp: , Rfl:   Allergies  Allergen Reactions  . Hornet Venom Anaphylaxis  . Aspirin Nausea And Vomiting         Objective:  Physical Exam  General: AAO x3, NAD  Dermatological: Ulceration present right foot submetatarsal 3 with hyperkeratotic periwound.  After debridement the wound measures 1 x 0.5 x 0.4 cm.  Mild edema but there is no erythema or warmth.  No drainage or pus.  No fluctuation crepitation.  There is no malodor.  No other open lesions are identified. Nails are hypertrophic, dystrophic, brittle, discolored, elongated 10. No surrounding redness or drainage. Tenderness nails 1-5 bilaterally. No open lesions or pre-ulcerative lesions are identified today.  Vascular: Dorsalis Pedis artery and Posterior Tibial artery pedal pulses are 2/4 bilateral with immedate capillary fill time.  There is no pain with calf compression, swelling, warmth, erythema.   Neruologic: Sensation decreased with monofilament  Musculoskeletal: Prominence the metatarsal heads plantarly.  Muscular strength 5/5 in all groups tested bilateral.  Gait: Unassisted, Nonantalgic.       Assessment:   Right foot ulceration    Plan:  -Treatment options discussed including all  alternatives, risks, and complications -Etiology of symptoms were discussed -X-rays were obtained and reviewed with the patient.  No evidence of acute fracture, foreign body or evidence of osteomyelitis.  No soft tissue emphysema. -Debrided the hyperkeratotic tissue, ulceration utilizing #312 with scalpel down to healthy, bleeding, granular tissue. -Cam boot dispensed for offloading -Prisma dressing changes daily.(Originally ordered Silvadene but I did cancel this I called the pharmacy and left a voicemail cancel this.) -Prescribed Keflex. -Elevation -Discussed  long-term use diabetic insert to help offload the area. -Debrided the nails x10 without any complications or bleeding -Monitor for any clinical signs or symptoms of infection and directed to call the office immediately should any occur or go to the ER.  Return in about 10 days (around 11/18/2019).  Trula Slade DPM

## 2019-11-20 ENCOUNTER — Other Ambulatory Visit: Payer: Self-pay

## 2019-11-20 ENCOUNTER — Ambulatory Visit: Payer: 59 | Admitting: Podiatry

## 2019-11-20 VITALS — Temp 98.5°F

## 2019-11-20 DIAGNOSIS — L97519 Non-pressure chronic ulcer of other part of right foot with unspecified severity: Secondary | ICD-10-CM

## 2019-11-20 DIAGNOSIS — E1149 Type 2 diabetes mellitus with other diabetic neurological complication: Secondary | ICD-10-CM

## 2019-11-20 MED ORDER — CICLOPIROX 8 % EX SOLN: Freq: Every day | CUTANEOUS | 2 refills | Status: DC

## 2019-11-20 NOTE — Patient Instructions (Addendum)
Finish this bottle of antibiotics. After that you should not need anymore. Continue the antibiotic cream on the wound daily. Remain in the surgical boot. Bring your work boot with you next appointment.  Monitor for any signs/symptoms of infection. Call the office immediately if any occur or go directly to the emergency room. Call with any questions/concerns.  Ciclopirox nail solution What is this medicine? CICLOPIROX (sye kloe PEER ox) NAIL SOLUTION is an antifungal medicine. It used to treat fungal infections of the nails. This medicine may be used for other purposes; ask your health care provider or pharmacist if you have questions. COMMON BRAND NAME(S): [id number], [place] What should I tell my health care provider before I take this medicine? They need to know if you have any of these conditions:  diabetes mellitus  history of seizures  HIV infection  immune system problems or organ transplant  large areas of burned or damaged skin  peripheral vascular disease or poor circulation  taking corticosteroid medication (including steroid inhalers, cream, or lotion)  an unusual or allergic reaction to ciclopirox, isopropyl alcohol, other medicines, foods, dyes, or preservatives  pregnant or trying to get pregnant  breast-feeding How should I use this medicine? This medicine is for external use only. Follow the directions that come with this medicine exactly. Wash and dry your hands before use. Avoid contact with the eyes, mouth or nose. If you do get this medicine in your eyes, rinse out with plenty of cool tap water. Contact your doctor or health care professional if eye irritation occurs. Use at regular intervals. Do not use your medicine more often than directed. Finish the full course prescribed by your doctor or health care professional even if you think you are better. Do not stop using except on your doctor's advice. Talk to your pediatrician regarding the use of this medicine in  children. While this medicine may be prescribed for children as young as 12 years for selected conditions, precautions do apply. Overdosage: If you think you have taken too much of this medicine contact a poison control center or emergency room at once. NOTE: This medicine is only for you. Do not share this medicine with others. What if I miss a dose? If you miss a dose, use it as soon as you can. If it is almost time for your next dose, use only that dose. Do not use double or extra doses. What may interact with this medicine? Interactions are not expected. Do not use any other skin products without telling your doctor or health care professional. This list may not describe all possible interactions. Give your health care provider a list of all the medicines, herbs, non-prescription drugs, or dietary supplements you use. Also tell them if you smoke, drink alcohol, or use illegal drugs. Some items may interact with your medicine. What should I watch for while using this medicine? Tell your doctor or health care professional if your symptoms get worse. Four to six months of treatment may be needed for the nail(s) to improve. Some people may not achieve a complete cure or clearing of the nails by this time. Tell your doctor or health care professional if you develop sores or blisters that do not heal properly. If your nail infection returns after stopping using this product, contact your doctor or health care professional. What side effects may I notice from receiving this medicine? Side effects that you should report to your doctor or health care professional as soon as possible:  allergic reactions  like skin rash, itching or hives, swelling of the face, lips, or tongue  severe irritation, redness, burning, blistering, peeling, swelling, oozing Side effects that usually do not require medical attention (report to your doctor or health care professional if they continue or are bothersome):  mild  reddening of the skin  nail discoloration  temporary burning or mild stinging at the site of application This list may not describe all possible side effects. Call your doctor for medical advice about side effects. You may report side effects to FDA at 1-800-FDA-1088. Where should I keep my medicine? Keep out of the reach of children. Store at room temperature between 15 and 30 degrees C (59 and 86 degrees F). Do not freeze. Protect from light by storing the bottle in the carton after every use. This medicine is flammable. Keep away from heat and flame. Throw away any unused medicine after the expiration date. NOTE: This sheet is a summary. It may not cover all possible information. If you have questions about this medicine, talk to your doctor, pharmacist, or health care provider.  2020 Elsevier/Gold Standard (2008-01-29 16:49:20)

## 2019-11-21 NOTE — Progress Notes (Signed)
Subjective: 49 year old male presents the office today for follow-up evaluation of a wound on the bottom of his right foot submetatarsal area.  He thinks that he is doing better.  He was using the [facility] but feels that not able to pack it so he has been using the gentamicin cream that he has at home.  Denies any increase in swelling or redness to his feet he denies any fevers, chills, nausea, vomiting.  No calf pain, chest pain, shortness of breath. Denies any systemic complaints such as fevers, chills, nausea, vomiting. No acute changes since last appointment, and no other complaints at this time.   Objective: AAO x3, NAD DP/PT pulses palpable bilaterally, CRT less than 3 seconds On the right foot submetatarsal is a hyperkeratotic lesion.  Upon debridement there is a small superficial wound present but appears to be almost healed.  There is no edema, erythema, drainage or pus or any signs of infection noted today. No pain with calf compression, swelling, warmth, erythema  Assessment: Right submetatarsal ulceration with improvement  Plan: -All treatment options discussed with the patient including all alternatives, risks, complications.  -Debrided the hyperkeratotic lesion, ulcerations or utilizing the 312 with scalpel any complications or bleeding.  I will continue with antibiotic.  He has at home and continues cam boot and offloading.  He is going to follow back up with me but also we will schedule him with Raiford Noble for measurement of inserts for support shoes and will bring his work boots with him next appointment. -Monitor for any clinical signs or symptoms of infection and directed to call the office immediately should any occur or go to the ER. -Patient encouraged to call the office with any questions, concerns, change in symptoms.   Vivi Barrack DPM

## 2019-11-29 ENCOUNTER — Other Ambulatory Visit: Payer: Self-pay

## 2019-11-29 ENCOUNTER — Ambulatory Visit (INDEPENDENT_AMBULATORY_CARE_PROVIDER_SITE_OTHER): Payer: 59 | Admitting: Podiatry

## 2019-11-29 ENCOUNTER — Ambulatory Visit (INDEPENDENT_AMBULATORY_CARE_PROVIDER_SITE_OTHER): Payer: 59 | Admitting: Orthotics

## 2019-11-29 ENCOUNTER — Encounter: Payer: Self-pay | Admitting: Podiatry

## 2019-11-29 DIAGNOSIS — M216X1 Other acquired deformities of right foot: Secondary | ICD-10-CM

## 2019-11-29 DIAGNOSIS — L84 Corns and callosities: Secondary | ICD-10-CM

## 2019-11-29 DIAGNOSIS — E1149 Type 2 diabetes mellitus with other diabetic neurological complication: Secondary | ICD-10-CM

## 2019-11-29 DIAGNOSIS — L97519 Non-pressure chronic ulcer of other part of right foot with unspecified severity: Secondary | ICD-10-CM

## 2019-11-29 NOTE — Progress Notes (Signed)
Patient was seen today for offloading painful plantar ulcer 3 R.  Area of concerned was marked and patient was scanned/cast to offload the keratoma/fibroma.  A LW accomodative device will be fabricated for the patient with appropriate offloads.

## 2019-11-29 NOTE — Patient Instructions (Signed)
Keep the antibiotic ointment on the area. Next week as long as it is still closed you can put moisturizer on the area. If you notice any opening, drainage, swelling, redness, pain or any other issues please call me immediately at [phone]

## 2019-11-29 NOTE — Progress Notes (Signed)
Orthotic note:  Cast for CMFO accomodative device semi-rigid, p-cell cover, previous ulcer marked in scan for offload intrinsic 3 R.   Patient given OTS diabetic insert w/ offload and k/wedge to use until f/o come in.

## 2019-12-10 NOTE — Progress Notes (Signed)
Subjective: 49 year old male presents the office today for follow-up evaluation of a wound on the bottom of his right foot submetatarsal area.  He feels that the area is getting better and is ready go back to work.  He did bring his work boot with him so we could evaluate this in order to appropriately offload the wound.  He denies any swelling or redness.  Denies any drainage or pus. Denies any systemic complaints such as fevers, chills, nausea, vomiting. No acute changes since last appointment, and no other complaints at this time.   Objective: AAO x3, NAD DP/PT pulses palpable bilaterally, CRT less than 3 seconds On the right foot submetatarsal is a hyperkeratotic lesion.  Upon debridement it appears that the wound is healed though still preulcerative.  There is no edema, erythema, drainage or pus there is no clinical signs of infection. No pain with calf compression, swelling, warmth, erythema  Assessment: Right submetatarsal ulceration with which is healed  Plan: -All treatment options discussed with the patient including all alternatives, risks, complications.  -Debrided the hyperkeratotic lesion without any complications or bleeding.  There is a the wound is healed.  Plan to continue with dressing changes to avoid any further pressure to help prevent any further skin breakdown or reoccurrence.  I also have him see Raiford Noble today and a temporary insert was given and he was also measured for new orthotics.  See separate note. -Monitor for any clinical signs or symptoms of infection and directed to call the office immediately should any occur or go to the ER. -Patient encouraged to call the office with any questions, concerns, change in symptoms.   Vivi Barrack DPM

## 2019-12-20 ENCOUNTER — Ambulatory Visit: Payer: 59 | Admitting: Podiatry

## 2019-12-20 ENCOUNTER — Ambulatory Visit: Payer: 59 | Admitting: Orthotics

## 2019-12-20 ENCOUNTER — Other Ambulatory Visit: Payer: Self-pay

## 2019-12-20 DIAGNOSIS — L84 Corns and callosities: Secondary | ICD-10-CM

## 2019-12-20 DIAGNOSIS — M216X1 Other acquired deformities of right foot: Secondary | ICD-10-CM

## 2019-12-20 DIAGNOSIS — E1149 Type 2 diabetes mellitus with other diabetic neurological complication: Secondary | ICD-10-CM | POA: Diagnosis not present

## 2019-12-25 NOTE — Progress Notes (Signed)
Subjective: 49 year old male presents the office today for follow-up evaluation of a wound on the bottom of his right foot submetatarsal area. He has been doing well. He went back to work but unfortunate got laid off afterwards. He denies any open sores and denies any redness or drainage or any swelling. He is hoping to pick up orthotics today. He has no other concerns today. Denies any systemic complaints such as fevers, chills, nausea, vomiting. No acute changes since last appointment, and no other complaints at this time.   Objective: AAO x3, NAD DP/PT pulses palpable bilaterally, CRT less than 3 seconds On the right foot submetatarsal is a hyperkeratotic lesion. Debridement there is no underlying ulceration identified at this time the area is preulcerative. There is no open lesions identified. There is no edema, erythema, drainage or pus or any signs of infection to the feet. No pain. No pain with calf compression, swelling, warmth, erythema  Assessment: Preulcerative callus right foot  Plan: -All treatment options discussed with the patient including all alternatives, risks, complications.  -Debrided the hyperkeratotic lesion without any complications or bleeding. Currently there is no ulcerations. I want to keep moisturizer on the feet daily but not interdigitally. Awaiting orthotics. They will be mailed tomorrow apparently. Will contact him once they come in. -Discussed the importance of daily foot inspection.  See him back 4 weeks after getting the inserts make sure they are fitting appropriately.  Vivi Barrack DPM

## 2020-01-17 ENCOUNTER — Ambulatory Visit: Payer: 59 | Admitting: Podiatry

## 2020-01-17 ENCOUNTER — Other Ambulatory Visit: Payer: Self-pay

## 2020-01-17 ENCOUNTER — Encounter: Payer: Self-pay | Admitting: Podiatry

## 2020-01-17 VITALS — Temp 97.3°F

## 2020-01-17 DIAGNOSIS — M79674 Pain in right toe(s): Secondary | ICD-10-CM | POA: Diagnosis not present

## 2020-01-17 DIAGNOSIS — B351 Tinea unguium: Secondary | ICD-10-CM

## 2020-01-17 DIAGNOSIS — E1149 Type 2 diabetes mellitus with other diabetic neurological complication: Secondary | ICD-10-CM

## 2020-01-17 DIAGNOSIS — L84 Corns and callosities: Secondary | ICD-10-CM

## 2020-01-17 DIAGNOSIS — M79675 Pain in left toe(s): Secondary | ICD-10-CM

## 2020-01-17 NOTE — Patient Instructions (Addendum)
WEARING INSTRUCTIONS FOR ORTHOTICS  Don't expect to be comfortable wearing your orthotic devices for the first time.  Like eyeglasses, you may be aware of them as time passes, they will not be uncomfortable and you will enjoy wearing them.  FOLLOW THESE INSTRUCTIONS EXACTLY!  1. Wear your orthotic devices for:       Not more than 1 hour the first day.       Not more than 2 hours the second day.       Not more than 3 hours the third day and so on.        Or wear them for as long as they feel comfortable.       If you experience discomfort in your feet or legs take them out.  When feet & legs feel       better, put them back in.  You do need to be consistent and wear them a little        everyday. 2.   If at any time the orthotic devices become acutely uncomfortable before the       time for that particular day, STOP WEARING THEM. 3.   On the next day, do not increase the wearing time. 4.   Subsequently, increase the wearing time by 15-30 minutes only if comfortable to do       so. 5.   You will be seen by your doctor about 2-4 weeks after you receive your orthotic       devices, at which time you will probably be wearing your devices comfortably        for about 8 hours or more a day. 6.   Some patients occasionally report mild aches or discomfort in other parts of the of       body such as the knees, hips or back after 3 or 4 consecutive hours of wear.  If this       is the case with you, do not extend your wearing time.  Instead, cut it back an hour or       two.  In all likelihood, these symptoms will disappear in a short period of time as your       body posture realigns itself and functions more efficiently. 7.   It is possible that your orthotic device may require some small changes or adjustment       to improve their function or make them more comfortable.   This is usually not done       before one to three months have elapsed.  These adjustments are made in        accordance  with the changed position your feet are assuming as a result of       improved biomechanical function. 8.   In women's shoes, it's not unusual for your heel to slip out of the shoe, particularly if       they are step-in-shoes.  If this is the case, try other shoes or other styles.  Try to       purchase shoes which have deeper heal seats or higher heel counters. 9.   Squeaking of orthotics devices in the shoes is due to the movement of the devices       when they are functioning normally.  To eliminate squeaking, simply dust some       baby powder into your shoes before inserting the devices.  If this does not work,          apply soap or wax to the edges of the orthotic devices or put a tissue into the shoes. 10. It is important that you follow these directions explicitly.  Failure to do so will simply       prolong the adjustment period or create problems which are easily avoided.  It makes       no difference if you are wearing your orthotic devices for only a few hours after        several months, so long as you are wearing them comfortably for those hours. 11. If you have any questions or complaints, contact our office.  We have no way of       knowing about your problems unless you tell us.  If we do not hear from you, we will       assume that you are proceeding well.   Diabetes Mellitus and Foot Care Foot care is an important part of your health, especially when you have diabetes. Diabetes may cause you to have problems because of poor blood flow (circulation) to your feet and legs, which can cause your skin to:  Become thinner and drier.  Break more easily.  Heal more slowly.  Peel and crack. You may also have nerve damage (neuropathy) in your legs and feet, causing decreased feeling in them. This means that you may not notice minor injuries to your feet that could lead to more serious problems. Noticing and addressing any potential problems early is the best way to prevent future  foot problems. How to care for your feet Foot hygiene  Wash your feet daily with warm water and mild soap. Do not use hot water. Then, pat your feet and the areas between your toes until they are completely dry. Do not soak your feet as this can dry your skin.  Trim your toenails straight across. Do not dig under them or around the cuticle. File the edges of your nails with an emery board or nail file.  Apply a moisturizing lotion or petroleum jelly to the skin on your feet and to dry, brittle toenails. Use lotion that does not contain alcohol and is unscented. Do not apply lotion between your toes. Shoes and socks  Wear clean socks or stockings every day. Make sure they are not too tight. Do not wear knee-high stockings since they may decrease blood flow to your legs.  Wear shoes that fit properly and have enough cushioning. Always look in your shoes before you put them on to be sure there are no objects inside.  To break in new shoes, wear them for just a few hours a day. This prevents injuries on your feet. Wounds, scrapes, corns, and calluses  Check your feet daily for blisters, cuts, bruises, sores, and redness. If you cannot see the bottom of your feet, use a mirror or ask someone for help.  Do not cut corns or calluses or try to remove them with medicine.  If you find a minor scrape, cut, or break in the skin on your feet, keep it and the skin around it clean and dry. You may clean these areas with mild soap and water. Do not clean the area with peroxide, alcohol, or iodine.  If you have a wound, scrape, corn, or callus on your foot, look at it several times a day to make sure it is healing and not infected. Check for: ? Redness, swelling, or pain. ? Fluid or blood. ? Warmth. ? Pus or a bad  smell. General instructions  Do not cross your legs. This may decrease blood flow to your feet.  Do not use heating pads or hot water bottles on your feet. They may burn your skin. If you  have lost feeling in your feet or legs, you may not know this is happening until it is too late.  Protect your feet from hot and cold by wearing shoes, such as at the beach or on hot pavement.  Schedule a complete foot exam at least once a year (annually) or more often if you have foot problems. If you have foot problems, report any cuts, sores, or bruises to your health care provider immediately. Contact a health care provider if:  You have a medical condition that increases your risk of infection and you have any cuts, sores, or bruises on your feet.  You have an injury that is not healing.  You have redness on your legs or feet.  You feel burning or tingling in your legs or feet.  You have pain or cramps in your legs and feet.  Your legs or feet are numb.  Your feet always feel cold.  You have pain around a toenail. Get help right away if:  You have a wound, scrape, corn, or callus on your foot and: ? You have pain, swelling, or redness that gets worse. ? You have fluid or blood coming from the wound, scrape, corn, or callus. ? Your wound, scrape, corn, or callus feels warm to the touch. ? You have pus or a bad smell coming from the wound, scrape, corn, or callus. ? You have a fever. ? You have a red line going up your leg. Summary  Check your feet every day for cuts, sores, red spots, swelling, and blisters.  Moisturize feet and legs daily.  Wear shoes that fit properly and have enough cushioning.  If you have foot problems, report any cuts, sores, or bruises to your health care provider immediately.  Schedule a complete foot exam at least once a year (annually) or more often if you have foot problems. This information is not intended to replace advice given to you by your health care provider. Make sure you discuss any questions you have with your health care provider. Document Revised: 07/18/2019 Document Reviewed: 11/26/2016 Elsevier Patient Education  [place].

## 2020-01-18 NOTE — Progress Notes (Signed)
Subjective: 49 year old male presents the office today for follow-up evaluation of a wound, preulcerative callus to the bottom of his right foot.  He states he is doing well.  He denies any drainage or swelling or redness of the foot.  He is returned to work.  He presents to pick up orthotics as well.  He has no new concerns. Denies any systemic complaints such as fevers, chills, nausea, vomiting. No acute changes since last appointment, and no other complaints at this time.   Objective: AAO x3, NAD DP/PT pulses palpable bilaterally, CRT less than 3 seconds On the right foot submetatarsal is a hyperkeratotic lesion.  Upon debridement there is no ongoing ulceration, drainage or any signs of infection the wound appears to be healed.  Nails are hypertrophic, dystrophic, brittle, discolored, elongated 10. No surrounding redness or drainage.  Nails are elongated causing irritation with shoes.  No open lesions or pre-ulcerative lesions are identified today. No pain with calf compression, swelling, warmth, erythema  Assessment: Preulcerative callus right foot; symptomatic onychomycosis  Plan: -All treatment options discussed with the patient including all alternatives, risks, complications.  -Debrided the hyperkeratotic lesion without any complications or bleeding.  No open lesions identified today.  Recommend moisturizer daily. -Debrided nails x10 without any complications or bleeding -Orthotics were dispensed today.  Oral and written break-in instructions were discussed. -Discussed the importance of daily foot inspection  Return in about 4 weeks (around 02/14/2020).  Vivi Barrack DPM

## 2020-02-18 ENCOUNTER — Ambulatory Visit (INDEPENDENT_AMBULATORY_CARE_PROVIDER_SITE_OTHER): Payer: 59 | Admitting: Podiatry

## 2020-02-18 ENCOUNTER — Encounter: Payer: Self-pay | Admitting: Podiatry

## 2020-02-18 ENCOUNTER — Other Ambulatory Visit: Payer: Self-pay

## 2020-02-18 DIAGNOSIS — L84 Corns and callosities: Secondary | ICD-10-CM | POA: Diagnosis not present

## 2020-02-18 DIAGNOSIS — E1149 Type 2 diabetes mellitus with other diabetic neurological complication: Secondary | ICD-10-CM

## 2020-02-21 ENCOUNTER — Ambulatory Visit: Payer: 59 | Admitting: Podiatry

## 2020-02-24 NOTE — Progress Notes (Signed)
Subjective: 49 year old male presents the office today for follow-up evaluation of a wound, preulcerative callus to the bottom of his right foot.  He states he is doing well.  He does rotate the inserts but he does still base to hurt him at times.  He states he is still getting used to them.  He does not have them with him.  Denies any open sores.  The wound on the right foot is healed. Denies any systemic complaints such as fevers, chills, nausea, vomiting. No acute changes since last appointment, and no other complaints at this time.   Objective: AAO x3, NAD DP/PT pulses palpable bilaterally, CRT less than 3 seconds On the right foot submetatarsal is a hyperkeratotic lesion and upon debridement there is no ongoing ulceration, drainage or any signs of infection noted today.  There is no open sores identified bilaterally.  No edema, erythema any signs of infection.  He also acute dysphasia between his first and second toes which help avoid any pressure.  There is no skin breakdown identified today. No pain with calf compression, swelling, warmth, erythema  Assessment: Preulcerative callus right foot  Plan: -All treatment options discussed with the patient including all alternatives, risks, complications.  -Debrided hyperkeratotic tissue without any complications or bleeding today.  Continue moisturizer and offloading.  Continue to break in the orthotics but they are causing discomfort and let us know we can try to modify them for him.  Return in about 2 months (around 04/19/2020).  Vivi Barrack DPM

## 2020-04-21 ENCOUNTER — Ambulatory Visit (INDEPENDENT_AMBULATORY_CARE_PROVIDER_SITE_OTHER): Payer: 59 | Admitting: Podiatry

## 2020-04-21 ENCOUNTER — Other Ambulatory Visit: Payer: Self-pay

## 2020-04-21 DIAGNOSIS — E1149 Type 2 diabetes mellitus with other diabetic neurological complication: Secondary | ICD-10-CM

## 2020-04-21 DIAGNOSIS — B351 Tinea unguium: Secondary | ICD-10-CM | POA: Diagnosis not present

## 2020-04-21 DIAGNOSIS — M79674 Pain in right toe(s): Secondary | ICD-10-CM | POA: Diagnosis not present

## 2020-04-21 DIAGNOSIS — L84 Corns and callosities: Secondary | ICD-10-CM

## 2020-04-22 NOTE — Progress Notes (Signed)
Subjective: 49 year old male presents the office today for follow-up evaluation of a wound, preulcerative callus to the bottom of his right foot.  THE NAILS BE TRIMMED AS THEY ARE THICK AND ELONGATED HE CANNOT DO IT HIMSELF.  DENIES ANY REDNESS OR DRAINAGE OR ANY SWELLING.  DENIES ANY NEW SORES ON HIS FEET.  HE HAS BEEN ON HIS FEET QUITE A BIT AT WORK WORKING 10-HOUR DAYS. Denies any systemic complaints such as fevers, chills, nausea, vomiting. No acute changes since last appointment, and no other complaints at this time.   Objective: AAO x3, NAD DP/PT pulses palpable bilaterally, CRT less than 3 seconds On the right foot submetatarsal is a hyperkeratotic lesion and upon debridement there is no ongoing ulceration, drainage or any signs of infection noted today.  There is no open sores identified bilaterally.  No edema, erythema any signs of infection.  Nails are hypertrophic, dystrophic, brittle, discolored, elongated 10. No surrounding redness or drainage. Tenderness nails 1-5 bilaterally. No open lesions or pre-ulcerative lesions are identified today. No pain with calf compression, swelling, warmth, erythema  Assessment: Preulcerative callus right foot; onychomycosis  Plan: -All treatment options discussed with the patient including all alternatives, risks, complications.  -Debrided hyperkeratotic tissue without any complications or bleeding today.  Continue moisturizer and offloading.  Continue orthotics-nails sharply debrided x 10 without any complications or bleeding  Vivi Barrack DPM

## 2020-06-23 ENCOUNTER — Encounter: Payer: Self-pay | Admitting: Podiatry

## 2020-06-23 ENCOUNTER — Ambulatory Visit (INDEPENDENT_AMBULATORY_CARE_PROVIDER_SITE_OTHER): Payer: 59 | Admitting: Podiatry

## 2020-06-23 ENCOUNTER — Other Ambulatory Visit: Payer: Self-pay

## 2020-06-23 DIAGNOSIS — E1149 Type 2 diabetes mellitus with other diabetic neurological complication: Secondary | ICD-10-CM

## 2020-06-23 DIAGNOSIS — M79675 Pain in left toe(s): Secondary | ICD-10-CM

## 2020-06-23 DIAGNOSIS — M79674 Pain in right toe(s): Secondary | ICD-10-CM | POA: Diagnosis not present

## 2020-06-23 DIAGNOSIS — B353 Tinea pedis: Secondary | ICD-10-CM | POA: Diagnosis not present

## 2020-06-23 DIAGNOSIS — B351 Tinea unguium: Secondary | ICD-10-CM

## 2020-06-23 DIAGNOSIS — M2042 Other hammer toe(s) (acquired), left foot: Secondary | ICD-10-CM | POA: Diagnosis not present

## 2020-06-23 DIAGNOSIS — L84 Corns and callosities: Secondary | ICD-10-CM | POA: Diagnosis not present

## 2020-06-23 DIAGNOSIS — M2041 Other hammer toe(s) (acquired), right foot: Secondary | ICD-10-CM

## 2020-06-23 MED ORDER — KETOCONAZOLE 2 % EX CREA: 1.0000 "application " | TOPICAL_CREAM | Freq: Every day | CUTANEOUS | 2 refills | Status: DC

## 2020-06-23 NOTE — Progress Notes (Signed)
Subjective: 49 year old male presents the office today for follow-up evaluation of a wound, preulcerative callus to the bottom of his right foot.  He has been doing well with the calluses been coming back.  He stopped the new orthotics as they are not comfortable.  He has no other concerns today. Denies any systemic complaints such as fevers, chills, nausea, vomiting. No acute changes since last appointment, and no other complaints at this time.   Objective: AAO x3, NAD DP/PT pulses palpable bilaterally, CRT less than 3 seconds Interdigital tinea pedis present right fourth interspace. On the right foot submetatarsal is a hyperkeratotic lesion and upon debridement there is no ongoing ulceration, drainage or any signs of infection noted today.  There is no open sores identified bilaterally.  No edema, erythema any signs of infection.  Nails are hypertrophic, dystrophic, brittle, discolored, elongated 10. No surrounding redness or drainage. Tenderness nails 1-5 bilaterally. No open lesions or pre-ulcerative lesions are identified today. Hammertoes present. No pain with calf compression, swelling, warmth, erythema  Assessment: Preulcerative callus right foot; onychomycosis; tinea pedis Plan: -All treatment options discussed with the patient including all alternatives, risks, complications.  -Debrided hyperkeratotic tissue without any complications or bleeding today.  -Debrided nails x10 without any complications or bleeding -Ketoconazole -Toe separators ordered -I will have the new orthotics to see Raiford Noble for modifications.   Vivi Barrack DPM

## 2020-07-22 ENCOUNTER — Ambulatory Visit: Payer: 59 | Admitting: Orthotics

## 2020-07-22 ENCOUNTER — Other Ambulatory Visit: Payer: Self-pay

## 2020-07-22 DIAGNOSIS — E1149 Type 2 diabetes mellitus with other diabetic neurological complication: Secondary | ICD-10-CM

## 2020-07-22 DIAGNOSIS — M2041 Other hammer toe(s) (acquired), right foot: Secondary | ICD-10-CM

## 2020-07-22 DIAGNOSIS — L84 Corns and callosities: Secondary | ICD-10-CM

## 2020-07-22 DIAGNOSIS — M2042 Other hammer toe(s) (acquired), left foot: Secondary | ICD-10-CM

## 2020-07-22 NOTE — Progress Notes (Signed)
Created more offload by adding k-wedges to bottom of f/o.

## 2020-09-01 ENCOUNTER — Ambulatory Visit (INDEPENDENT_AMBULATORY_CARE_PROVIDER_SITE_OTHER): Payer: 59 | Admitting: Podiatry

## 2020-09-01 ENCOUNTER — Other Ambulatory Visit: Payer: Self-pay

## 2020-09-01 ENCOUNTER — Encounter: Payer: Self-pay | Admitting: Podiatry

## 2020-09-01 DIAGNOSIS — M79674 Pain in right toe(s): Secondary | ICD-10-CM

## 2020-09-01 DIAGNOSIS — M79675 Pain in left toe(s): Secondary | ICD-10-CM | POA: Diagnosis not present

## 2020-09-01 DIAGNOSIS — B351 Tinea unguium: Secondary | ICD-10-CM | POA: Diagnosis not present

## 2020-09-01 DIAGNOSIS — L84 Corns and callosities: Secondary | ICD-10-CM | POA: Diagnosis not present

## 2020-09-01 DIAGNOSIS — E1149 Type 2 diabetes mellitus with other diabetic neurological complication: Secondary | ICD-10-CM

## 2020-09-04 NOTE — Progress Notes (Signed)
Subjective: 49 year old male presents the office today for follow-up evaluation of a  preulcerative callus to the bottom of his right foot as well as with thick, elongated toenails that he cannot trim himself.  Denies any open lesions.  Is been wearing the orthotics which have been helpful however he is on his feet quite a bit during the day with work.  He has no other concerns today. Denies any systemic complaints such as fevers, chills, nausea, vomiting. No acute changes since last appointment, and no other complaints at this time.   Objective: AAO x3, NAD DP/PT pulses palpable bilaterally, CRT less than 3 seconds On the right foot submetatarsal is a hyperkeratotic lesion and upon debridement there is no ongoing ulceration, drainage or any signs of infection noted today.  There is no open sores identified bilaterally.  No edema, erythema any signs of infection.  Nails are hypertrophic, dystrophic, brittle, discolored, elongated 10. No surrounding redness or drainage. Tenderness nails 1-5 bilaterally. No open lesions or pre-ulcerative lesions are identified today. Hammertoes present. No pain with calf compression, swelling, warmth, erythema  Assessment: Preulcerative callus right foot; onychomycosis  Plan: -All treatment options discussed with the patient including all alternatives, risks, complications.  -Debrided hyperkeratotic tissue without any complications or bleeding today.  -Debrided nails x10 without any complications or bleeding -Daily foot inspection  Steven Barnes DPM

## 2020-12-02 ENCOUNTER — Ambulatory Visit: Payer: 59 | Admitting: Podiatry

## 2020-12-18 ENCOUNTER — Ambulatory Visit (INDEPENDENT_AMBULATORY_CARE_PROVIDER_SITE_OTHER): Payer: 59 | Admitting: Podiatry

## 2020-12-18 ENCOUNTER — Other Ambulatory Visit: Payer: Self-pay

## 2020-12-18 DIAGNOSIS — E1149 Type 2 diabetes mellitus with other diabetic neurological complication: Secondary | ICD-10-CM

## 2020-12-18 DIAGNOSIS — M79675 Pain in left toe(s): Secondary | ICD-10-CM

## 2020-12-18 DIAGNOSIS — M79674 Pain in right toe(s): Secondary | ICD-10-CM | POA: Diagnosis not present

## 2020-12-18 DIAGNOSIS — L84 Corns and callosities: Secondary | ICD-10-CM

## 2020-12-18 DIAGNOSIS — B351 Tinea unguium: Secondary | ICD-10-CM | POA: Diagnosis not present

## 2020-12-20 NOTE — Progress Notes (Signed)
Subjective: 50 year old male presents the office today for follow-up evaluation of a  preulcerative callus to the bottom of his right foot as well as with thick, elongated toenails that he cannot trim himself.  Denies any open lesions.  He feels that he has been doing well in the callus although is coming back is not coming back as thick.  Inserts have been helping. Denies any systemic complaints such as fevers, chills, nausea, vomiting. No acute changes since last appointment, and no other complaints at this time.   Objective: AAO x3, NAD DP/PT pulses palpable bilaterally, CRT less than 3 seconds On the right foot submetatarsal is a hyperkeratotic lesion and upon debridement there is no ongoing ulceration, drainage or any signs of infection noted today.  There is no open sores identified bilaterally.  No edema, erythema any signs of infection.  Nails are hypertrophic, dystrophic, brittle, discolored, elongated 10. No surrounding redness or drainage. Tenderness nails 1-5 bilaterally. No open lesions or pre-ulcerative lesions are identified today. Hammertoes present. No pain with calf compression, swelling, warmth, erythema  Assessment: Preulcerative callus right foot; onychomycosis  Plan: -All treatment options discussed with the patient including all alternatives, risks, complications.  -Debrided hyperkeratotic tissue x1 without any complications or bleeding today.  -Debrided nails x10 without any complications or bleeding -Daily foot inspection  Steven Barnes DPM

## 2021-02-10 ENCOUNTER — Other Ambulatory Visit: Payer: Self-pay | Admitting: Podiatry

## 2021-02-10 NOTE — Telephone Encounter (Signed)
Please advise 

## 2021-03-17 ENCOUNTER — Ambulatory Visit: Payer: 59 | Admitting: Podiatry

## 2021-03-19 ENCOUNTER — Other Ambulatory Visit: Payer: Self-pay

## 2021-03-19 ENCOUNTER — Ambulatory Visit (INDEPENDENT_AMBULATORY_CARE_PROVIDER_SITE_OTHER): Payer: 59 | Admitting: Podiatry

## 2021-03-19 DIAGNOSIS — B351 Tinea unguium: Secondary | ICD-10-CM | POA: Diagnosis not present

## 2021-03-19 DIAGNOSIS — E1149 Type 2 diabetes mellitus with other diabetic neurological complication: Secondary | ICD-10-CM | POA: Diagnosis not present

## 2021-03-19 DIAGNOSIS — M79675 Pain in left toe(s): Secondary | ICD-10-CM | POA: Diagnosis not present

## 2021-03-19 DIAGNOSIS — M79674 Pain in right toe(s): Secondary | ICD-10-CM | POA: Diagnosis not present

## 2021-03-19 DIAGNOSIS — L84 Corns and callosities: Secondary | ICD-10-CM | POA: Diagnosis not present

## 2021-03-19 NOTE — Progress Notes (Signed)
Subjective: 50 year old male presents the office today for follow-up evaluation of a  preulcerative callus to the bottom of his right foot as well as with thick, elongated toenails that he cannot trim himself.  He also has a hammertoe of the left second toe which he uses a toe cap for which is helpful.  Denies any ulcerations.  No recent changes otherwise.    Objective: AAO x3, NAD DP/PT pulses palpable bilaterally, CRT less than 3 seconds On the right foot submetatarsal is a hyperkeratotic lesion and upon debridement there is no ongoing ulceration, drainage or any signs of infection noted today.  There is no open sores identified bilaterally.  No edema, erythema any signs of infection.  Nails are hypertrophic, dystrophic, brittle, discolored, elongated 10. No surrounding redness or drainage. Tenderness nails 1-5 bilaterally. No open lesions or pre-ulcerative lesions are identified today. Hammertoes present. No pain with calf compression, swelling, warmth, erythema  Assessment: Preulcerative callus right foot; onychomycosis  Plan: -All treatment options discussed with the patient including all alternatives, risks, complications.  -Debrided hyperkeratotic tissue x1 without any complications or bleeding today.  -Debrided nails x10 without any complications or bleeding -Dispensed further offloading pads for the hammertoes. -Daily foot inspection  Vivi Barrack DPM

## 2021-06-06 ENCOUNTER — Other Ambulatory Visit: Payer: Self-pay

## 2021-06-06 DIAGNOSIS — S50861A Insect bite (nonvenomous) of right forearm, initial encounter: Secondary | ICD-10-CM | POA: Insufficient documentation

## 2021-06-06 DIAGNOSIS — Z79899 Other long term (current) drug therapy: Secondary | ICD-10-CM | POA: Insufficient documentation

## 2021-06-06 DIAGNOSIS — I10 Essential (primary) hypertension: Secondary | ICD-10-CM | POA: Diagnosis not present

## 2021-06-06 DIAGNOSIS — E119 Type 2 diabetes mellitus without complications: Secondary | ICD-10-CM | POA: Diagnosis not present

## 2021-06-06 DIAGNOSIS — F17210 Nicotine dependence, cigarettes, uncomplicated: Secondary | ICD-10-CM | POA: Diagnosis not present

## 2021-06-06 DIAGNOSIS — Z7984 Long term (current) use of oral hypoglycemic drugs: Secondary | ICD-10-CM | POA: Diagnosis not present

## 2021-06-06 DIAGNOSIS — W57XXXA Bitten or stung by nonvenomous insect and other nonvenomous arthropods, initial encounter: Secondary | ICD-10-CM | POA: Diagnosis not present

## 2021-06-07 ENCOUNTER — Emergency Department (HOSPITAL_COMMUNITY)
Admission: EM | Admit: 2021-06-07 | Discharge: 2021-06-07 | Disposition: A | Discharge status: Home / Self Care | Payer: 59 | Attending: Emergency Medicine | Admitting: Emergency Medicine

## 2021-06-07 ENCOUNTER — Other Ambulatory Visit: Payer: Self-pay

## 2021-06-07 DIAGNOSIS — T63441A Toxic effect of venom of bees, accidental (unintentional), initial encounter: Secondary | ICD-10-CM

## 2021-06-07 DIAGNOSIS — I10 Essential (primary) hypertension: Secondary | ICD-10-CM

## 2021-06-07 MED ORDER — DEXAMETHASONE 4 MG PO TABS
10.0000 mg | ORAL_TABLET | Freq: Once | ORAL | Status: AC
Start: 2021-06-07 — End: 2021-06-07
  Administered 2021-06-07: 10 mg via ORAL
  Filled 2021-06-07: qty 3

## 2021-06-07 MED ORDER — OXYCODONE-ACETAMINOPHEN 7.5-325 MG PO TABS: 1.0000 | ORAL_TABLET | Freq: Four times a day (QID) | ORAL | 0 refills | Status: AC | PRN

## 2021-06-07 NOTE — Discharge Instructions (Addendum)
Apply ice as needed.  Take antihistamine medications as needed.  Common antihistamines include diphenhydramine (Benadryl), loratadine (Claritin) and cetirizine (Zyrtec).  Loratadine and cetirizine are taken once a day, diphenhydramine needs to be taken every 4-6 hours as needed.  Return if you develop any difficulty breathing or swallowing.  Please be aware that the itching and swelling may last several weeks.  Your blood pressure was elevated today.  Please monitor it at home.  If it is staying consistently high, your primary care provider will need to adjust your blood pressure medications.

## 2021-06-07 NOTE — ED Triage Notes (Addendum)
Pt states that he was strung by a bee around 7:30 this morning. 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 Pt denies any throat swelling or trouble breathing. Pt states that it is "just itching" at site. Pt just wants to be evaluated.  Pt took benadryl at 8:30 this morning

## 2021-06-07 NOTE — ED Provider Notes (Signed)
[facility] EMERGENCY DEPARTMENT Provider Note   CSN: [id number] Arrival date & time: 06/06/21  2330     History Chief Complaint  Patient presents with   Insect Bite    Steven Barnes is a 50 y.o. male.  The history is provided by the patient.  He has history of diabetes, hypertension and comes in after being stung in his right forearm by a bee.  He states that he was driving and the bee flew in through an open window.  This occurred at about 7:30 AM.  He has noted swelling of his right forearm and some itching but he denies any difficulty breathing or swallowing.  He has not had any serious reactions to stings in the past.   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 Authorizing Provider  ciclopirox (PENLAC) 8 % solution APPLY OVER NAIL AND SURROUNDING SKIN AT BEDTIME. APPLY DAILY OVER PREVIOUS COAT. AFTER 7 DAYS, MAY REMOVE WITH ALCHOL AND CONTINUE CYCLE 02/10/21   McDonald, Rachelle Hora, DPM  ketoconazole (NIZORAL) 2 % cream Apply 1 application topically daily. 06/23/20   Vivi Barrack, DPM  lisinopril  (PRINIVIL,ZESTRIL) 10 MG tablet Take 10 mg by mouth every morning.    [provider]  lisinopril (ZESTRIL) 20 MG tablet Take 1 tablet by mouth daily. 02/10/21   [provider]  Loratadine (CLARITIN PO) Take by mouth.    [provider]  meloxicam (MOBIC) 15 MG tablet Take 1 tablet by mouth daily. 02/10/21   [provider]  metFORMIN (GLUCOPHAGE-XR) 500 MG 24 hr tablet Take 500-1,000 mg by mouth See admin instructions. 1000mg  in the morning and 500mg  at bedtime    [provider]  Multiple Vitamin (MULTIVITAMIN WITH MINERALS) TABS tablet Take 1 tablet by mouth at bedtime.    [provider]  simvastatin (ZOCOR) 10 MG tablet Take 10 mg by mouth at bedtime.    [provider]    Allergies    Hornet venom and Aspirin  Review of Systems   Review of Systems  All other systems reviewed and are negative.  Physical Exam Updated Vital Signs BP (!) 193/110   Pulse 69   Temp 98.2 F (36.8 C) (Oral)   Resp 18   Ht 6' (1.829 m)   Wt 122.5 kg   SpO2 98%   BMI 36.62 kg/m   Physical  Exam Vitals and nursing note reviewed.  50 year old male, resting comfortably and in no acute distress. Vital signs are significant for elevated blood pressure. Oxygen saturation is 98%, which is normal. Head is normocephalic and atraumatic. PERRLA, EOMI. Oropharynx is clear. Neck is nontender and supple without adenopathy or JVD. Back is nontender and there is no CVA tenderness. Lungs are clear without rales, wheezes, or rhonchi. Chest is nontender. Heart has regular rate and rhythm without murmur. Abdomen is soft, flat, nontender without masses or hepatosplenomegaly and peristalsis is normoactive. Extremities: There is slight erythema and swelling of the proximal right forearm consistent with local reaction to insect sting.  Remainder of extremity exam is normal. Skin is warm and dry without rash. Neurologic: Mental status is normal, cranial nerves  are intact, moves all extremities equally.  ED Results / Procedures / Treatments    Procedures Procedures   Medications Ordered in ED Medications  dexamethasone (DECADRON) tablet 10 mg (has no administration in time range)    ED Course  I have reviewed the triage vital signs and the nursing notes.  MDM Rules/Calculators/A&P                         Insect sting to right forearm with local reaction.  No evidence of anaphylaxis.  Elevated blood pressure.  Patient was reassured that he does not have a serious reaction to the sting, advised local measures and antihistamines.  Recommended that he monitor his blood pressure at home.  Old records are reviewed, and he has no relevant past visits.  Final Clinical Impression(s) / ED Diagnoses Final diagnoses:  Bee sting, accidental or unintentional, initial encounter  Elevated blood pressure reading with diagnosis of hypertension    Rx / DC Orders ED Discharge Orders     None        Dione Booze, MD 06/07/21 0302

## 2021-06-22 ENCOUNTER — Ambulatory Visit: Payer: 59 | Admitting: Podiatry

## 2021-06-25 ENCOUNTER — Ambulatory Visit (INDEPENDENT_AMBULATORY_CARE_PROVIDER_SITE_OTHER): Payer: 59 | Admitting: Podiatry

## 2021-06-25 ENCOUNTER — Other Ambulatory Visit: Payer: Self-pay

## 2021-06-25 DIAGNOSIS — B351 Tinea unguium: Secondary | ICD-10-CM

## 2021-06-25 DIAGNOSIS — M79675 Pain in left toe(s): Secondary | ICD-10-CM

## 2021-06-25 DIAGNOSIS — M79674 Pain in right toe(s): Secondary | ICD-10-CM | POA: Diagnosis not present

## 2021-06-25 DIAGNOSIS — E1149 Type 2 diabetes mellitus with other diabetic neurological complication: Secondary | ICD-10-CM | POA: Diagnosis not present

## 2021-06-25 DIAGNOSIS — L84 Corns and callosities: Secondary | ICD-10-CM | POA: Diagnosis not present

## 2021-06-25 MED ORDER — CICLOPIROX 8 % EX SOLN: CUTANEOUS | 2 refills | Status: AC

## 2021-06-27 NOTE — Progress Notes (Signed)
Subjective: 50 year old male presents the office today for thick, elongated toes that he cannot trim himself as well as for callus on the right foot.  Denies any open sores.  He states the callus to get thick etc. came off on its own.  There is no bleeding occurred when this happened.  He has no other concerns.  No fevers or chills.  No open lesions.   Objective: AAO x3, NAD DP/PT pulses palpable bilaterally, CRT less than 3 seconds On the right foot submetatarsal is a hyperkeratotic lesion and after debridement there is no ongoing ulceration, drainage or any signs of infection noted today.  There is no open sores identified bilaterally.  No edema, erythema any signs of infection.  Nails are hypertrophic, dystrophic, brittle, discolored, elongated 10. No surrounding redness or drainage. Tenderness nails 1-5 bilaterally. No open lesions or pre-ulcerative lesions are identified today. Hammertoes present. No pain with calf compression, swelling, warmth, erythema  Assessment: Preulcerative callus right foot; onychomycosis  Plan: -All treatment options discussed with the patient including all alternatives, risks, complications.  -Debrided hyperkeratotic tissue x1 without any complications or bleeding today.  Continue moisturizer, offloading. -Debrided nails x10 without any complications or bleeding.  Refill Penlac at his request. -He states the toes are up and I dispensed toe separators.  There is no interdigital lesions. -Dispensed further offloading pads for the hammertoes. -Daily foot inspection  Vivi Barrack DPM

## 2021-07-07 ENCOUNTER — Telehealth: Payer: Self-pay | Admitting: *Deleted

## 2021-07-07 NOTE — Telephone Encounter (Signed)
Sent prior authorization request form for Ciclopirox solution to [employer] and the fax number is [phone]. Misty Stanley

## 2021-07-24 ENCOUNTER — Telehealth: Payer: Self-pay | Admitting: *Deleted

## 2021-07-24 NOTE — Telephone Encounter (Signed)
Called and left a message for the patient and stated that the insurance denied the RX due to the patient has not tried different medicines and to call the office if any concerns or questions. Misty Stanley

## 2021-07-24 NOTE — Telephone Encounter (Signed)
-----   Message from Vivi Barrack, DPM sent at 07/22/2021  3:57 PM EDT ----- Troy Sine. He has been on the Pelanc. Please let him know.  ----- Message ----- From: Lanney Gins, PMAC Sent: 07/22/2021   9:59 AM EDT To: Vivi Barrack, DPM  FYI-Hey, the patient's insurance denied the medicine due to the patient has not tried three formulary alternatives within the same pharmacological class as the requested medicine. Misty Stanley

## 2021-09-28 ENCOUNTER — Ambulatory Visit: Payer: 59 | Admitting: Podiatry

## 2021-10-08 ENCOUNTER — Ambulatory Visit (INDEPENDENT_AMBULATORY_CARE_PROVIDER_SITE_OTHER): Payer: 59 | Admitting: Podiatry

## 2021-10-08 ENCOUNTER — Other Ambulatory Visit: Payer: Self-pay

## 2021-10-08 DIAGNOSIS — M79674 Pain in right toe(s): Secondary | ICD-10-CM

## 2021-10-08 DIAGNOSIS — B351 Tinea unguium: Secondary | ICD-10-CM

## 2021-10-08 DIAGNOSIS — L84 Corns and callosities: Secondary | ICD-10-CM

## 2021-10-08 DIAGNOSIS — E1149 Type 2 diabetes mellitus with other diabetic neurological complication: Secondary | ICD-10-CM

## 2021-10-08 DIAGNOSIS — M79675 Pain in left toe(s): Secondary | ICD-10-CM

## 2021-10-11 NOTE — Progress Notes (Signed)
Subjective: 50 year old male presents the office today for thick, elongated toes that he cannot trim himself as well as for callus on the right foot.  He thinks the callus has gotten more thick compared to last appointment.  Denies any open sores or any drainage.  No fever chills or any other concerns.  Objective: AAO x3, NAD DP/PT pulses palpable bilaterally, CRT less than 3 seconds On the right foot sub metatarsal 1 and 3 are hyperkeratotic lesions and after debridement there is no ongoing ulceration, drainage or any signs of infection noted today.  There is no open sores identified bilaterally.  No edema, erythema any signs of infection.  Nails are hypertrophic, dystrophic, brittle, discolored, elongated 10. No surrounding redness or drainage. Tenderness nails 1-5 bilaterally. No open lesions or pre-ulcerative lesions are identified today. Hammertoes present. No pain with calf compression, swelling, warmth, erythema  Assessment: Preulcerative callus right foot; onychomycosis  Plan: -All treatment options discussed with the patient including all alternatives, risks, complications.  -Debrided hyperkeratotic tissue x 2 without any complications or bleeding today.  Continue moisturizer, offloading. -Debrided nails x10 without any complications or bleeding.  Refill Penlac at his request. -He states the toes are up and I dispensed toe separators.  There is no interdigital lesions. -Dispensed further offloading pads for the hammertoes. -Daily foot inspection  Vivi Barrack DPM

## 2022-01-06 DIAGNOSIS — E11621 Type 2 diabetes mellitus with foot ulcer: Secondary | ICD-10-CM | POA: Diagnosis not present

## 2022-01-06 DIAGNOSIS — I10 Essential (primary) hypertension: Secondary | ICD-10-CM | POA: Diagnosis not present

## 2022-01-07 ENCOUNTER — Ambulatory Visit: Payer: 59 | Admitting: Podiatry

## 2022-01-11 ENCOUNTER — Encounter: Payer: Self-pay | Admitting: Podiatry

## 2022-01-11 ENCOUNTER — Other Ambulatory Visit: Payer: Self-pay

## 2022-01-11 ENCOUNTER — Ambulatory Visit: Payer: 59 | Admitting: Podiatry

## 2022-01-11 DIAGNOSIS — M79674 Pain in right toe(s): Secondary | ICD-10-CM

## 2022-01-11 DIAGNOSIS — L84 Corns and callosities: Secondary | ICD-10-CM

## 2022-01-11 DIAGNOSIS — E1149 Type 2 diabetes mellitus with other diabetic neurological complication: Secondary | ICD-10-CM | POA: Diagnosis not present

## 2022-01-11 DIAGNOSIS — M79675 Pain in left toe(s): Secondary | ICD-10-CM | POA: Diagnosis not present

## 2022-01-11 DIAGNOSIS — B351 Tinea unguium: Secondary | ICD-10-CM

## 2022-01-11 DIAGNOSIS — E875 Hyperkalemia: Secondary | ICD-10-CM | POA: Diagnosis not present

## 2022-01-14 NOTE — Progress Notes (Signed)
Subjective: ?51 year old male presents the office today for thick, elongated toes that he cannot trim himself as well as for callus on the right foot.  He states the callus on the right side become thickened and reportedly did come off on its own but he has not seen any swelling or redness or any drainage.  No open sores.  He does not wear his orthotics all the time.  He does need new inserts.  No fevers or chills. ? ?Objective: ?AAO x3, NAD ?DP/PT pulses palpable bilaterally, CRT less than 3 seconds ?On the right foot sub metatarsal 1 and 3 are hyperkeratotic lesions and after debridement there is no ongoing ulceration, drainage or any signs of infection noted today.  There was some dried blood present underneath the lesion submetatarsal 3 in the right foot there is no open lesion identified otherwise.  There is no open sores identified bilaterally.  No edema, erythema any signs of infection.  ?Nails are hypertrophic, dystrophic, brittle, discolored, elongated ?10. No surrounding redness or drainage. Tenderness nails 1-5 bilaterally. No open lesions or pre-ulcerative lesions are identified today. ?Hammertoes present.  Prominent metatarsal heads. ?No pain with calf compression, swelling, warmth, erythema ? ?Assessment: ?Preulcerative callus right foot; onychomycosis ? ?Plan: ?-All treatment options discussed with the patient including all alternatives, risks, complications.  ?-Debrided hyperkeratotic tissue x 2 without any complications or bleeding today.  Continue moisturizer, offloading. ?-Debrided nails x10 without any complications or bleeding.  He is using Penlac ?-Continue toe separators as this has been helpful. ?-Follow-up with Aaron Edelman for new inserts ?-Dispensed further offloading pads for the hammertoes. ?-Daily foot inspection ? ?Trula Slade DPM ? ?

## 2022-04-14 DIAGNOSIS — I10 Essential (primary) hypertension: Secondary | ICD-10-CM | POA: Diagnosis not present

## 2022-04-19 ENCOUNTER — Ambulatory Visit (INDEPENDENT_AMBULATORY_CARE_PROVIDER_SITE_OTHER): Payer: 59 | Admitting: Podiatry

## 2022-04-19 ENCOUNTER — Ambulatory Visit (INDEPENDENT_AMBULATORY_CARE_PROVIDER_SITE_OTHER): Payer: 59

## 2022-04-19 DIAGNOSIS — M79675 Pain in left toe(s): Secondary | ICD-10-CM

## 2022-04-19 DIAGNOSIS — M79674 Pain in right toe(s): Secondary | ICD-10-CM | POA: Diagnosis not present

## 2022-04-19 DIAGNOSIS — L84 Corns and callosities: Secondary | ICD-10-CM | POA: Diagnosis not present

## 2022-04-19 DIAGNOSIS — M2041 Other hammer toe(s) (acquired), right foot: Secondary | ICD-10-CM | POA: Diagnosis not present

## 2022-04-19 DIAGNOSIS — B351 Tinea unguium: Secondary | ICD-10-CM

## 2022-04-19 DIAGNOSIS — E1149 Type 2 diabetes mellitus with other diabetic neurological complication: Secondary | ICD-10-CM

## 2022-04-19 DIAGNOSIS — M2042 Other hammer toe(s) (acquired), left foot: Secondary | ICD-10-CM | POA: Diagnosis not present

## 2022-04-19 NOTE — Progress Notes (Signed)
SITUATION Reason for Consult: Evaluation for Bilateral Custom Foot Orthoses Patient / Caregiver Report: Patient is ready for foot orthotics  OBJECTIVE DATA: Patient History / Diagnosis:    ICD-10-CM   1. Hammertoes of both feet  M20.41    M20.42     2. Pain in toes of both feet  M79.674    M79.675     3. Pre-ulcerative calluses  L84     4. 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  ACTIONS PERFORMED Potential out of pocket cost was communicated to patient. Patient understood and consent to casting. Patient was casted for Foot Orthoses via crush box. Procedure was explained and patient tolerated procedure well. Casts were shipped to central fabrication. All questions were answered and concerns addressed.  PLAN Patient is to be called for fitting when devices are ready.

## 2022-04-22 NOTE — Progress Notes (Signed)
Subjective: 51 year old male presents the office today for thick, elongated toes that he cannot trim himself as well as for callus on the right foot.  No new lesions.  No swelling redness or any drainage that he reports.  No other concerns today.  Objective: AAO x3, NAD DP/PT pulses palpable bilaterally, CRT less than 3 seconds On the right foot sub metatarsal 1 and 3 are hyperkeratotic lesions and after debridement there is no ongoing ulceration, drainage or any signs of infection noted today.  Some dried blood still present underneath the callus but after debridement there is no underlying ulceration drainage or signs of infection is able to debride the area to have the dried blood. Nails are hypertrophic, dystrophic, brittle, discolored, elongated 10. No surrounding redness or drainage. Tenderness nails 1-5 bilaterally. No open lesions or pre-ulcerative lesions are identified today. Hammertoes present.  Prominent metatarsal heads. No pain with calf compression, swelling, warmth, erythema  Assessment: Preulcerative callus right foot; onychomycosis  Plan: -All treatment options discussed with the patient including all alternatives, risks, complications.  -Debrided hyperkeratotic tissue x 2 without any complications or bleeding today.  Continue moisturizer, offloading. -Debrided nails x10 without any complications or bleeding.  He is using Penlac -Continue toe separators as this has been helpful. -Dispensed further offloading pads for the hammertoes. -Daily foot inspection  Vivi Barrack DPM

## 2022-05-31 ENCOUNTER — Ambulatory Visit (INDEPENDENT_AMBULATORY_CARE_PROVIDER_SITE_OTHER): Payer: 59

## 2022-05-31 DIAGNOSIS — M2041 Other hammer toe(s) (acquired), right foot: Secondary | ICD-10-CM

## 2022-05-31 DIAGNOSIS — M2042 Other hammer toe(s) (acquired), left foot: Secondary | ICD-10-CM

## 2022-05-31 DIAGNOSIS — E1149 Type 2 diabetes mellitus with other diabetic neurological complication: Secondary | ICD-10-CM

## 2022-05-31 NOTE — Progress Notes (Signed)
Patient presents today to pick up custom molded foot orthotics, diagnosed with hammertoes of both feet by Dr. Ardelle Anton.   Orthotics were dispensed and fit was satisfactory. Reviewed instructions for break-in and wear. Written instructions given to patient.  Patient will follow up as needed.   [facility] - order # [id number]

## 2022-06-01 ENCOUNTER — Ambulatory Visit: Payer: 59 | Admitting: Podiatry

## 2022-07-20 ENCOUNTER — Ambulatory Visit: Payer: 59 | Admitting: Podiatry

## 2022-07-27 DIAGNOSIS — I10 Essential (primary) hypertension: Secondary | ICD-10-CM | POA: Diagnosis not present

## 2022-07-27 DIAGNOSIS — E11621 Type 2 diabetes mellitus with foot ulcer: Secondary | ICD-10-CM | POA: Diagnosis not present

## 2022-07-27 DIAGNOSIS — E782 Mixed hyperlipidemia: Secondary | ICD-10-CM | POA: Diagnosis not present

## 2022-08-03 ENCOUNTER — Ambulatory Visit (INDEPENDENT_AMBULATORY_CARE_PROVIDER_SITE_OTHER): Payer: 59 | Admitting: Podiatry

## 2022-08-03 DIAGNOSIS — I10 Essential (primary) hypertension: Secondary | ICD-10-CM | POA: Diagnosis not present

## 2022-08-03 DIAGNOSIS — E875 Hyperkalemia: Secondary | ICD-10-CM | POA: Diagnosis not present

## 2022-08-03 DIAGNOSIS — R69 Illness, unspecified: Secondary | ICD-10-CM | POA: Diagnosis not present

## 2022-08-03 DIAGNOSIS — E782 Mixed hyperlipidemia: Secondary | ICD-10-CM | POA: Diagnosis not present

## 2022-08-03 DIAGNOSIS — L84 Corns and callosities: Secondary | ICD-10-CM | POA: Diagnosis not present

## 2022-08-03 DIAGNOSIS — M25562 Pain in left knee: Secondary | ICD-10-CM | POA: Diagnosis not present

## 2022-08-03 DIAGNOSIS — M79675 Pain in left toe(s): Secondary | ICD-10-CM

## 2022-08-03 DIAGNOSIS — B351 Tinea unguium: Secondary | ICD-10-CM

## 2022-08-03 DIAGNOSIS — E1149 Type 2 diabetes mellitus with other diabetic neurological complication: Secondary | ICD-10-CM

## 2022-08-03 DIAGNOSIS — R252 Cramp and spasm: Secondary | ICD-10-CM | POA: Diagnosis not present

## 2022-08-03 DIAGNOSIS — M79674 Pain in right toe(s): Secondary | ICD-10-CM | POA: Diagnosis not present

## 2022-08-03 DIAGNOSIS — E6609 Other obesity due to excess calories: Secondary | ICD-10-CM | POA: Diagnosis not present

## 2022-08-03 DIAGNOSIS — E1165 Type 2 diabetes mellitus with hyperglycemia: Secondary | ICD-10-CM | POA: Diagnosis not present

## 2022-08-03 DIAGNOSIS — Z6836 Body mass index (BMI) 36.0-36.9, adult: Secondary | ICD-10-CM | POA: Diagnosis not present

## 2022-08-05 NOTE — Progress Notes (Signed)
Subjective: 51 year old male presents the office today for thick, elongated toes that he cannot trim himself as well as for callus on the right foot.  States that some of the callus will come off on its own but no open lesions.  No swelling redness or drainage.  No other concerns today.    Objective: AAO x3, NAD DP/PT pulses palpable bilaterally, CRT less than 3 seconds On the right foot sub metatarsal 1 and 3 are hyperkeratotic lesions and after debridement there is no ongoing ulceration, drainage or any signs of infection noted today.  Some dried blood still present underneath the callus but after debridement there is no underlying ulceration drainage or signs of infection is able to debride the area to have the dried blood. Nails are hypertrophic, dystrophic, brittle, discolored, elongated 10. No surrounding redness or drainage. Tenderness nails 1-5 bilaterally. No open lesions or pre-ulcerative lesions are identified today. Hammertoes present.  Prominent metatarsal heads. No pain with calf compression, swelling, warmth, erythema  Assessment: Preulcerative callus right foot; onychomycosis  Plan: -All treatment options discussed with the patient including all alternatives, risks, complications.  -Debrided hyperkeratotic tissue x 2 without any complications or bleeding today.  Continue moisturizer, offloading. -Debrided nails x10 without any complications or bleeding.  He is using Penlac -Continue toe separators as this has been helpful. -Dispensed further offloading pads for the hammertoes. -Daily foot inspection  Trula Slade DPM

## 2022-10-26 ENCOUNTER — Ambulatory Visit: Payer: 59 | Admitting: Podiatry

## 2022-10-26 DIAGNOSIS — B351 Tinea unguium: Secondary | ICD-10-CM

## 2022-10-26 DIAGNOSIS — M79675 Pain in left toe(s): Secondary | ICD-10-CM

## 2022-10-26 DIAGNOSIS — E1149 Type 2 diabetes mellitus with other diabetic neurological complication: Secondary | ICD-10-CM

## 2022-10-26 DIAGNOSIS — L84 Corns and callosities: Secondary | ICD-10-CM | POA: Diagnosis not present

## 2022-10-26 DIAGNOSIS — M79674 Pain in right toe(s): Secondary | ICD-10-CM

## 2022-10-29 NOTE — Progress Notes (Signed)
Subjective: 51 year old male presents the office today for thick, elongated toes that he cannot trim himself as well as for callus on the right foot.  He states he developed a blister on the right foot submetatarsal 1 is starting to a callus.  Not sure what started this.  He has not seen any drainage or pus.  No swelling or redness.  He has no other concerns.  Objective: AAO x3, NAD DP/PT pulses palpable bilaterally, CRT less than 3 seconds On the right foot sub metatarsal 1 and 3 are hyperkeratotic lesions and after debridement there is no ongoing ulceration, drainage or any signs of infection noted today.  There is no blister formation noted today.  There is no drainage or pus. Nails are hypertrophic, dystrophic, brittle, discolored, elongated 10. No surrounding redness or drainage. Tenderness nails 1-5 bilaterally. No open lesions or pre-ulcerative lesions are identified today. Hammertoes present.  Prominent metatarsal heads. No pain with calf compression, swelling, warmth, erythema  Assessment: Preulcerative callus right foot; onychomycosis  Plan: -All treatment options discussed with the patient including all alternatives, risks, complications.  -Debrided hyperkeratotic tissue x 2 without any complications or bleeding today.  Continue moisturizer, offloading.  Offloading pad first insert.  If needed may need to be resurfaced -Debrided nails x10 without any complications or bleeding.  He is using Penlac -Continue toe separators as this has been helpful. -Dispensed further offloading pads for the hammertoes. -Daily foot inspection  Vivi Barrack DPM

## 2022-11-25 DIAGNOSIS — E1165 Type 2 diabetes mellitus with hyperglycemia: Secondary | ICD-10-CM | POA: Diagnosis not present

## 2022-12-01 DIAGNOSIS — E1165 Type 2 diabetes mellitus with hyperglycemia: Secondary | ICD-10-CM | POA: Diagnosis not present

## 2022-12-01 DIAGNOSIS — L84 Corns and callosities: Secondary | ICD-10-CM | POA: Diagnosis not present

## 2022-12-01 DIAGNOSIS — E669 Obesity, unspecified: Secondary | ICD-10-CM | POA: Diagnosis not present

## 2022-12-01 DIAGNOSIS — R252 Cramp and spasm: Secondary | ICD-10-CM | POA: Diagnosis not present

## 2022-12-01 DIAGNOSIS — R69 Illness, unspecified: Secondary | ICD-10-CM | POA: Diagnosis not present

## 2022-12-01 DIAGNOSIS — I10 Essential (primary) hypertension: Secondary | ICD-10-CM | POA: Diagnosis not present

## 2022-12-01 DIAGNOSIS — Z6838 Body mass index (BMI) 38.0-38.9, adult: Secondary | ICD-10-CM | POA: Diagnosis not present

## 2022-12-01 DIAGNOSIS — Z79899 Other long term (current) drug therapy: Secondary | ICD-10-CM | POA: Diagnosis not present

## 2022-12-01 DIAGNOSIS — H6503 Acute serous otitis media, bilateral: Secondary | ICD-10-CM | POA: Diagnosis not present

## 2022-12-01 DIAGNOSIS — M25562 Pain in left knee: Secondary | ICD-10-CM | POA: Diagnosis not present

## 2022-12-01 DIAGNOSIS — Z7984 Long term (current) use of oral hypoglycemic drugs: Secondary | ICD-10-CM | POA: Diagnosis not present

## 2022-12-01 DIAGNOSIS — E782 Mixed hyperlipidemia: Secondary | ICD-10-CM | POA: Diagnosis not present

## 2023-01-25 ENCOUNTER — Ambulatory Visit (INDEPENDENT_AMBULATORY_CARE_PROVIDER_SITE_OTHER): Payer: 59 | Admitting: Podiatry

## 2023-01-25 DIAGNOSIS — M79674 Pain in right toe(s): Secondary | ICD-10-CM

## 2023-01-25 DIAGNOSIS — B351 Tinea unguium: Secondary | ICD-10-CM

## 2023-01-25 DIAGNOSIS — E1149 Type 2 diabetes mellitus with other diabetic neurological complication: Secondary | ICD-10-CM | POA: Diagnosis not present

## 2023-01-25 DIAGNOSIS — M79675 Pain in left toe(s): Secondary | ICD-10-CM

## 2023-01-25 DIAGNOSIS — L84 Corns and callosities: Secondary | ICD-10-CM

## 2023-01-25 NOTE — Progress Notes (Signed)
Subjective: Chief Complaint  Patient presents with   ROUTINE FOOT CARE     52 year old male presents the office today for thick, elongated toes that he cannot trim himself as well as for callus on the right foot.  He has been able to wear sneakers at work more and has been helping with the calluses times a day but still present.  Asking for new toe separators as well.  No open lesions.  No swelling redness or drainage.   Objective: AAO x3, NAD DP/PT pulses palpable bilaterally, CRT less than 3 seconds On the right foot sub metatarsal 3 there is a hyperkeratotic lesion and after debridement there is no ongoing ulceration, drainage or any signs of infection noted today.  There is no blister formation noted today.  There is no drainage or pus. Nails are hypertrophic, dystrophic, brittle, discolored, elongated 10. No surrounding redness or drainage. Tenderness nails 1-5 bilaterally. No open lesions or pre-ulcerative lesions are identified today. Hammertoes present.  Prominent metatarsal heads. No pain with calf compression, swelling, warmth, erythema  Assessment: Preulcerative callus right foot; onychomycosis  Plan: -All treatment options discussed with the patient including all alternatives, risks, complications.  -Debrided hyperkeratotic tissue x 1 without any complications or bleeding today.  Continue moisturizer, offloading.  Continue offloading. -Debrided nails x10 without any complications or bleeding.  He is using Penlac -Continue toe separators as this has been helpful. -Dispensed further offloading pads for the hammertoes. -Daily foot inspection  Trula Slade DPM

## 2023-04-06 DIAGNOSIS — E1165 Type 2 diabetes mellitus with hyperglycemia: Secondary | ICD-10-CM | POA: Diagnosis not present

## 2023-04-06 DIAGNOSIS — E782 Mixed hyperlipidemia: Secondary | ICD-10-CM | POA: Diagnosis not present

## 2023-04-06 DIAGNOSIS — I10 Essential (primary) hypertension: Secondary | ICD-10-CM | POA: Diagnosis not present

## 2023-04-13 DIAGNOSIS — Z79899 Other long term (current) drug therapy: Secondary | ICD-10-CM | POA: Diagnosis not present

## 2023-04-13 DIAGNOSIS — Z6837 Body mass index (BMI) 37.0-37.9, adult: Secondary | ICD-10-CM | POA: Diagnosis not present

## 2023-04-13 DIAGNOSIS — E782 Mixed hyperlipidemia: Secondary | ICD-10-CM | POA: Diagnosis not present

## 2023-04-13 DIAGNOSIS — L84 Corns and callosities: Secondary | ICD-10-CM | POA: Diagnosis not present

## 2023-04-13 DIAGNOSIS — E1165 Type 2 diabetes mellitus with hyperglycemia: Secondary | ICD-10-CM | POA: Diagnosis not present

## 2023-04-13 DIAGNOSIS — Z713 Dietary counseling and surveillance: Secondary | ICD-10-CM | POA: Diagnosis not present

## 2023-04-13 DIAGNOSIS — M25562 Pain in left knee: Secondary | ICD-10-CM | POA: Diagnosis not present

## 2023-04-13 DIAGNOSIS — I10 Essential (primary) hypertension: Secondary | ICD-10-CM | POA: Diagnosis not present

## 2023-04-13 DIAGNOSIS — F17200 Nicotine dependence, unspecified, uncomplicated: Secondary | ICD-10-CM | POA: Diagnosis not present

## 2023-04-13 DIAGNOSIS — R252 Cramp and spasm: Secondary | ICD-10-CM | POA: Diagnosis not present

## 2023-04-13 DIAGNOSIS — Z7984 Long term (current) use of oral hypoglycemic drugs: Secondary | ICD-10-CM | POA: Diagnosis not present

## 2023-04-28 ENCOUNTER — Ambulatory Visit: Payer: 59 | Admitting: Podiatry

## 2023-04-28 DIAGNOSIS — M79674 Pain in right toe(s): Secondary | ICD-10-CM | POA: Diagnosis not present

## 2023-04-28 DIAGNOSIS — E1149 Type 2 diabetes mellitus with other diabetic neurological complication: Secondary | ICD-10-CM | POA: Diagnosis not present

## 2023-04-28 DIAGNOSIS — L84 Corns and callosities: Secondary | ICD-10-CM

## 2023-04-28 DIAGNOSIS — M79675 Pain in left toe(s): Secondary | ICD-10-CM

## 2023-04-28 DIAGNOSIS — B351 Tinea unguium: Secondary | ICD-10-CM

## 2023-04-30 NOTE — Progress Notes (Signed)
Subjective: Chief Complaint  Patient presents with   Nail Problem    Nail trim      52 year old male presents the office today for thick, elongated toes that he cannot trim himself.  He said the callus he has not had previously has resolved and not causing any issues.  No open lesions.  No other concerns.   Objective: AAO x3, NAD DP/PT pulses palpable bilaterally, CRT less than 3 seconds Significant callus formation today and there is no open lesions. Nails are hypertrophic, dystrophic, brittle, discolored, elongated 10. No surrounding redness or drainage. Tenderness nails 1-5 bilaterally. No open lesions or pre-ulcerative lesions are identified today. Hammertoes present.  Prominent metatarsal heads. No pain with calf compression, swelling, warmth, erythema  Assessment: Preulcerative callus right foot; onychomycosis  Plan: -All treatment options discussed with the patient including all alternatives, risks, complications.  -Debrided nails x10 without any complications or bleeding.  He is using Penlac -Continue toe separators as this has been helpful. -Moisturizer daily monitor any reoccurrence of ulceration or callus formation. -Dispensed further offloading pads for the hammertoes. -Daily foot inspection  Vivi Barrack DPM

## 2023-08-11 DIAGNOSIS — E1165 Type 2 diabetes mellitus with hyperglycemia: Secondary | ICD-10-CM | POA: Diagnosis not present

## 2023-08-11 DIAGNOSIS — I10 Essential (primary) hypertension: Secondary | ICD-10-CM | POA: Diagnosis not present

## 2023-08-11 DIAGNOSIS — E782 Mixed hyperlipidemia: Secondary | ICD-10-CM | POA: Diagnosis not present

## 2023-08-11 DIAGNOSIS — Z125 Encounter for screening for malignant neoplasm of prostate: Secondary | ICD-10-CM | POA: Diagnosis not present

## 2023-08-12 ENCOUNTER — Ambulatory Visit: Payer: 59 | Admitting: Podiatry

## 2023-08-12 DIAGNOSIS — E1149 Type 2 diabetes mellitus with other diabetic neurological complication: Secondary | ICD-10-CM

## 2023-08-12 DIAGNOSIS — B351 Tinea unguium: Secondary | ICD-10-CM | POA: Diagnosis not present

## 2023-08-12 DIAGNOSIS — M79674 Pain in right toe(s): Secondary | ICD-10-CM | POA: Diagnosis not present

## 2023-08-12 DIAGNOSIS — M79675 Pain in left toe(s): Secondary | ICD-10-CM

## 2023-08-12 DIAGNOSIS — L84 Corns and callosities: Secondary | ICD-10-CM

## 2023-08-12 DIAGNOSIS — M216X1 Other acquired deformities of right foot: Secondary | ICD-10-CM | POA: Diagnosis not present

## 2023-08-12 DIAGNOSIS — B353 Tinea pedis: Secondary | ICD-10-CM

## 2023-08-12 MED ORDER — KETOCONAZOLE 2 % EX CREA: 1.0000 | TOPICAL_CREAM | Freq: Every day | CUTANEOUS | 0 refills | Status: DC

## 2023-08-12 NOTE — Progress Notes (Signed)
Subjective:  52 year old male presents the office today for thick, elongated toes that he cannot trim himself.  He said the callus he has not had previously has resolved and not causing any issues.  He states the third toe on the right foot big toe causing increased pressure in the toes are switched to together.  He uses toe spacers which helped.  No open lesions. No open lesions.  No other concerns.   Objective: AAO x3, NAD DP/PT pulses palpable bilaterally, CRT less than 3 seconds Significant callus formation today and there is no open lesions. Nails are hypertrophic, dystrophic, brittle, discolored, elongated 10. No surrounding redness or drainage. Tenderness nails 1-5 bilaterally.  There is slight callus formation submetatarsal bilaterally without any underlying ulceration drainage or signs of infection. There is dry, healing, erythematous skin interdigitally consistent with tinea pedis. Hammertoes present.  Prominent metatarsal heads. No pain with calf compression, swelling, warmth, erythema  Assessment: Preulcerative callus right foot; onychomycosis; tinea pedis  Plan: -All treatment options discussed with the patient including all alternatives, risks, complications.  -Debrided nails x10 without any complications or bleeding.  He is using Penlac -Continue toe separators as this has been helpful. -Prescribed ketoconazole to help with tinea pedis. -As a courtesy debride the calluses and complications of bleeding at this hide minimal.  Dispensed metatarsal pads.  Continue offloading supportive shoe gear to help with reoccurrence. -Moisturizer daily monitor any reoccurrence of ulceration or callus formation. -Dispensed further offloading pads for the hammertoes. -Daily foot inspection  Vivi Barrack DPM

## 2023-08-17 DIAGNOSIS — Z79899 Other long term (current) drug therapy: Secondary | ICD-10-CM | POA: Diagnosis not present

## 2023-08-17 DIAGNOSIS — Z6836 Body mass index (BMI) 36.0-36.9, adult: Secondary | ICD-10-CM | POA: Diagnosis not present

## 2023-08-17 DIAGNOSIS — L84 Corns and callosities: Secondary | ICD-10-CM | POA: Diagnosis not present

## 2023-08-17 DIAGNOSIS — E782 Mixed hyperlipidemia: Secondary | ICD-10-CM | POA: Diagnosis not present

## 2023-08-17 DIAGNOSIS — E1165 Type 2 diabetes mellitus with hyperglycemia: Secondary | ICD-10-CM | POA: Diagnosis not present

## 2023-08-17 DIAGNOSIS — E669 Obesity, unspecified: Secondary | ICD-10-CM | POA: Diagnosis not present

## 2023-08-17 DIAGNOSIS — I10 Essential (primary) hypertension: Secondary | ICD-10-CM | POA: Diagnosis not present

## 2023-08-17 DIAGNOSIS — R252 Cramp and spasm: Secondary | ICD-10-CM | POA: Diagnosis not present

## 2023-08-17 DIAGNOSIS — M25562 Pain in left knee: Secondary | ICD-10-CM | POA: Diagnosis not present

## 2023-08-17 DIAGNOSIS — F17200 Nicotine dependence, unspecified, uncomplicated: Secondary | ICD-10-CM | POA: Diagnosis not present

## 2023-08-17 DIAGNOSIS — Z7984 Long term (current) use of oral hypoglycemic drugs: Secondary | ICD-10-CM | POA: Diagnosis not present

## 2023-11-14 ENCOUNTER — Other Ambulatory Visit: Payer: Self-pay | Admitting: Podiatry

## 2023-11-18 ENCOUNTER — Encounter: Payer: Self-pay | Admitting: Podiatry

## 2023-11-18 ENCOUNTER — Ambulatory Visit: Payer: Commercial Managed Care - HMO | Admitting: Podiatry

## 2023-11-18 DIAGNOSIS — M79675 Pain in left toe(s): Secondary | ICD-10-CM

## 2023-11-18 DIAGNOSIS — B351 Tinea unguium: Secondary | ICD-10-CM | POA: Diagnosis not present

## 2023-11-18 DIAGNOSIS — E1149 Type 2 diabetes mellitus with other diabetic neurological complication: Secondary | ICD-10-CM | POA: Diagnosis not present

## 2023-11-18 DIAGNOSIS — M79674 Pain in right toe(s): Secondary | ICD-10-CM | POA: Diagnosis not present

## 2023-11-18 NOTE — Progress Notes (Signed)
 Subjective: Chief Complaint  Patient presents with   [facility]    RM#6 DFC     53 year old male presents the office today for thick, elongated toes that he cannot trim himself.  He said the callus areas been doing well.  She does get cream on his foot.  He does have nail fungus treatment he uses a couple times a week.  He does not use this on a consistent basis.  No open lesions or any new concerns today.    Objective: AAO x3, NAD DP/PT pulses palpable bilaterally, CRT less than 3 seconds Significant callus formation today and there is no open lesions. Nails are hypertrophic, dystrophic, brittle, discolored, elongated 10. No surrounding redness or drainage. Tenderness nails 1-5 bilaterally.  There is minimal callus formation submetatarsal bilaterally without any underlying ulceration drainage or signs of infection. There is dry, healing, erythematous skin interdigitally consistent with tinea pedis. Hammertoes present.  Prominent metatarsal heads. No pain with calf compression, swelling, warmth, erythema  Assessment: Preulcerative callus right foot; onychomycosis; tinea pedis  Plan: -All treatment options discussed with the patient including all alternatives, risks, complications.  -Debrided nails x10 without any complications or bleeding.  He is using Penlac  would recommend using this on a daily basis to be more successful. -Continue toe separators as this has been helpful. -Ketoconazole  cream prn -Continue moisturizer for the callused areas.  There is very minimal callus today to debride this on a courtesy  as it was quite minimal.  Return in about 3 months (around 02/16/2024).  Donnice JONELLE Fees DPM

## 2023-12-02 ENCOUNTER — Other Ambulatory Visit (HOSPITAL_COMMUNITY): Payer: Self-pay | Admitting: Nurse Practitioner

## 2023-12-02 DIAGNOSIS — M25562 Pain in left knee: Secondary | ICD-10-CM

## 2023-12-09 ENCOUNTER — Ambulatory Visit (HOSPITAL_COMMUNITY)
Admission: RE | Admit: 2023-12-09 | Discharge: 2023-12-09 | Disposition: A | Discharge status: Home / Self Care | Payer: Commercial Managed Care - HMO | Source: Ambulatory Visit | Attending: Nurse Practitioner | Admitting: Nurse Practitioner

## 2023-12-09 DIAGNOSIS — M25562 Pain in left knee: Secondary | ICD-10-CM | POA: Insufficient documentation

## 2024-02-16 ENCOUNTER — Encounter: Payer: Self-pay | Admitting: Podiatry

## 2024-02-16 ENCOUNTER — Ambulatory Visit: Payer: Commercial Managed Care - HMO | Admitting: Podiatry

## 2024-02-16 DIAGNOSIS — M79674 Pain in right toe(s): Secondary | ICD-10-CM | POA: Diagnosis not present

## 2024-02-16 DIAGNOSIS — L84 Corns and callosities: Secondary | ICD-10-CM

## 2024-02-16 DIAGNOSIS — B351 Tinea unguium: Secondary | ICD-10-CM | POA: Diagnosis not present

## 2024-02-16 DIAGNOSIS — E1149 Type 2 diabetes mellitus with other diabetic neurological complication: Secondary | ICD-10-CM

## 2024-02-16 DIAGNOSIS — M79675 Pain in left toe(s): Secondary | ICD-10-CM

## 2024-02-16 NOTE — Progress Notes (Signed)
 Subjective: Chief Complaint  Patient presents with   [facility]    RM#11 [facility] patient states has no concerns at this time.     53 year old male presents the office today for thick, elongated toes that he cannot trim himself.  He said the callus areas been doing well.  No open lesions. No new concerns.   Benita Stabile, MD Last seen 04/13/2023  Objective: AAO x3, NAD DP/PT pulses palpable bilaterally, CRT less than 3 seconds Significant callus formation today and there is no open lesions. Nails are hypertrophic, dystrophic, brittle, discolored, elongated 10. No surrounding redness or drainage. Tenderness nails 1-5 bilaterally.  There is callus formation submetatarsal right foot with minimal dried blood under the callus but upon debridement there is no underlying ulceration, drainage or any signs of infection. Hammertoes present.  Prominent metatarsal heads. No pain with calf compression, swelling, warmth, erythema  Assessment: Preulcerative callus right foot; onychomycosis; tinea pedis  Plan: -All treatment options discussed with the patient including all alternatives, risks, complications.  -Debrided nails x10 without any complications or bleeding.  Continue Penlac.  -Sharply debrided the hyperkeratotic lesion x 1 complication including on the right foot.  This was located right foot submetatarsal. -Continue toe separators as this has been helpful. -Continue moisturizer for the callused areas.  There is very minimal callus today to debride this on a courtesy  as it was quite minimal.  Return in about 3 months (around 05/17/2024).  Vivi Barrack DPM

## 2024-05-24 ENCOUNTER — Ambulatory Visit: Admitting: Podiatry

## 2024-05-24 VITALS — Ht 72.0 in | Wt 273.0 lb

## 2024-05-24 DIAGNOSIS — B351 Tinea unguium: Secondary | ICD-10-CM

## 2024-05-24 DIAGNOSIS — E1149 Type 2 diabetes mellitus with other diabetic neurological complication: Secondary | ICD-10-CM | POA: Diagnosis not present

## 2024-05-24 DIAGNOSIS — M79674 Pain in right toe(s): Secondary | ICD-10-CM

## 2024-05-24 DIAGNOSIS — M79675 Pain in left toe(s): Secondary | ICD-10-CM | POA: Diagnosis not present

## 2024-05-24 NOTE — Progress Notes (Signed)
 Subjective: Chief Complaint  Patient presents with   Nail Problem    Rm 13 Patient is here as a f/u for dermatophytosis of the nails. Patient request nail trimming and gel toe separators.    53 year old male presents the office today for thick, elongated toes that he cannot trim himself.  He said the callus areas been doing well.  No open lesions. No new concerns.   He has antifungal medication for his toenails but he states he does not use it regularly.  The callus has been doing well.  No open lesions.  States his blood sugar has been better controlled.   Shona Norleen PEDLAR, MD Last seen 08/17/2023  Objective: AAO x3, NAD DP/PT pulses palpable bilaterally, CRT less than 3 seconds There is no significant callus formation today and there is no open lesions bilaterally. Nails are hypertrophic, dystrophic, brittle, discolored, elongated 10. No surrounding redness or drainage. Tenderness nails 1-5 bilaterally.  Hammertoes present.  Prominent metatarsal heads. Mild maceration along the interspaces. No pain with calf compression, swelling, warmth, erythema  Assessment: Symptomatic onychomycosis; tinea pedis  Plan: -All treatment options discussed with the patient including all alternatives, risks, complications.  -Debrided nails x10 without any complications or bleeding.  Continue Penlac , discussed using this on a more regular basis -There is no significant callus today.  Moisturizer daily. -Continue toe separators as this has been helpful. -Dry well between toes.  -Daily foot inspection, glucose control discussed.   Return in about 3 months (around 05/17/2024).  Donnice JONELLE Fees DPM

## 2024-08-24 ENCOUNTER — Ambulatory Visit (INDEPENDENT_AMBULATORY_CARE_PROVIDER_SITE_OTHER): Admitting: Podiatry

## 2024-08-24 VITALS — Ht 72.0 in | Wt 273.0 lb

## 2024-08-24 DIAGNOSIS — M79674 Pain in right toe(s): Secondary | ICD-10-CM | POA: Diagnosis not present

## 2024-08-24 DIAGNOSIS — E1149 Type 2 diabetes mellitus with other diabetic neurological complication: Secondary | ICD-10-CM

## 2024-08-24 DIAGNOSIS — B351 Tinea unguium: Secondary | ICD-10-CM

## 2024-08-24 DIAGNOSIS — M79675 Pain in left toe(s): Secondary | ICD-10-CM | POA: Diagnosis not present

## 2024-08-24 NOTE — Progress Notes (Signed)
 Subjective: Chief Complaint  Patient presents with   Nail Problem    RM 75 RFC     53 year old male presents the office today for thick, elongated toes that he cannot trim himself.  He said the callus areas been doing well.  No open lesions. No new concerns.   He has antifungal medication for his toenails but he states he does not use it regularly.  The callus has been doing well.  No open lesions.   Shona Norleen PEDLAR, MD Last seen 08/17/2023-states he is scheduled next month.  Objective: AAO x3, NAD DP/PT pulses palpable bilaterally, CRT less than 3 seconds There is no significant callus formation today and there is no open lesions bilaterally. Nails are hypertrophic, dystrophic, brittle, discolored, elongated 10. No surrounding redness or drainage. Tenderness nails 1-5 bilaterally.  Hammertoes present.  Prominent metatarsal heads. Dry skin present with noted without any skin breakdown or ulcerations. No pain with calf compression, swelling, warmth, erythema  Assessment: Symptomatic onychomycosis; tinea pedis  Plan: -All treatment options discussed with the patient including all alternatives, risks, complications.  -Debrided nails x10 without any complications or bleeding.  Continue Penlac , discussed using this on a more regular basis -There is no significant callus today.  Moisturizer daily. -Continue toe separators as this has been helpful. -Daily foot inspection, glucose control discussed.   Return in about 3 months (around 11/24/2024).  Donnice JONELLE Fees DPM

## 2024-11-26 ENCOUNTER — Ambulatory Visit: Admitting: Podiatry

## 2024-11-26 DIAGNOSIS — M79675 Pain in left toe(s): Secondary | ICD-10-CM | POA: Diagnosis not present

## 2024-11-26 DIAGNOSIS — B351 Tinea unguium: Secondary | ICD-10-CM

## 2024-11-26 DIAGNOSIS — E1149 Type 2 diabetes mellitus with other diabetic neurological complication: Secondary | ICD-10-CM

## 2024-11-26 DIAGNOSIS — M79674 Pain in right toe(s): Secondary | ICD-10-CM | POA: Diagnosis not present

## 2024-11-26 NOTE — Progress Notes (Signed)
 Subjective: Chief Complaint  Patient presents with   [facility]    NIDDM Patient with an A1c of 6.6 presents today for a [facility] and nail trim patient denies numbness or tingling sensation.     54 year old male presents the office today for thick, elongated toes that he cannot trim himself.  He said the callus areas been doing well.  No open lesions. No new concerns.   He has antifungal medication for his toenails that he does use.   The callus has been doing well.  No open lesions.  He keeps moisturizer on the feet.   Objective: AAO x3, NAD DP/PT pulses palpable bilaterally, CRT less than 3 seconds There is no significant callus formation today and there is no open lesions bilaterally. Nails are hypertrophic, dystrophic, brittle, discolored, elongated 10. No surrounding redness or drainage. Tenderness nails 1-5 bilaterally.  Hammertoes present.  Prominent metatarsal heads. Dry skin present with noted without any skin breakdown or ulcerations. No pain with calf compression, swelling, warmth, erythema  Assessment: Symptomatic onychomycosis; tinea pedis  Plan: -All treatment options discussed with the patient including all alternatives, risks, complications.  -Debrided nails x10 without any complications or bleeding.  Continue Penlac , discussed using this on a more regular basis.  Discussed oral medication. -There is no significant callus today.  Moisturizer daily. -Continue toe separators as this has been helpful. -Daily foot inspection, glucose control discussed.   Return in about 3 months (around 02/24/2025).  Steven Barnes DPM

## 2024-11-26 NOTE — Patient Instructions (Signed)

## 2025-02-25 ENCOUNTER — Ambulatory Visit: Admitting: Podiatry
